# Patient Record
Sex: Female | Born: 1955 | Race: Black or African American | Hispanic: No | Marital: Married | Smoking: Never smoker
Health system: Southern US, Community
[De-identification: ages and names within clinical notes are randomized; demographics above are authoritative.]

## PROBLEM LIST (undated history)

## (undated) DIAGNOSIS — Z90710 Acquired absence of both cervix and uterus: Secondary | ICD-10-CM

## (undated) DIAGNOSIS — E669 Obesity, unspecified: Secondary | ICD-10-CM

## (undated) DIAGNOSIS — K219 Gastro-esophageal reflux disease without esophagitis: Secondary | ICD-10-CM

## (undated) DIAGNOSIS — Z7901 Long term (current) use of anticoagulants: Secondary | ICD-10-CM

## (undated) DIAGNOSIS — Z9289 Personal history of other medical treatment: Secondary | ICD-10-CM

## (undated) DIAGNOSIS — E876 Hypokalemia: Secondary | ICD-10-CM

## (undated) DIAGNOSIS — B029 Zoster without complications: Secondary | ICD-10-CM

## (undated) DIAGNOSIS — Z9229 Personal history of other drug therapy: Secondary | ICD-10-CM

## (undated) DIAGNOSIS — I10 Essential (primary) hypertension: Secondary | ICD-10-CM

## (undated) DIAGNOSIS — Z8679 Personal history of other diseases of the circulatory system: Secondary | ICD-10-CM

## (undated) DIAGNOSIS — M1990 Unspecified osteoarthritis, unspecified site: Secondary | ICD-10-CM

## (undated) DIAGNOSIS — E785 Hyperlipidemia, unspecified: Secondary | ICD-10-CM

## (undated) DIAGNOSIS — R5383 Other fatigue: Secondary | ICD-10-CM

## (undated) DIAGNOSIS — I4891 Unspecified atrial fibrillation: Secondary | ICD-10-CM

## (undated) HISTORY — PX: ABDOMINAL HYSTERECTOMY: SHX81

## (undated) HISTORY — DX: Personal history of other drug therapy: Z92.29

## (undated) HISTORY — DX: Personal history of other diseases of the circulatory system: Z86.79

## (undated) HISTORY — DX: Personal history of other medical treatment: Z92.89

## (undated) HISTORY — DX: Other fatigue: R53.83

## (undated) HISTORY — DX: Long term (current) use of anticoagulants: Z79.01

## (undated) HISTORY — DX: Hypokalemia: E87.6

## (undated) HISTORY — DX: Obesity, unspecified: E66.9

## (undated) HISTORY — DX: Gastro-esophageal reflux disease without esophagitis: K21.9

## (undated) HISTORY — DX: Hyperlipidemia, unspecified: E78.5

## (undated) HISTORY — PX: PARTIAL HYSTERECTOMY: SHX80

---

## 1999-05-25 ENCOUNTER — Ambulatory Visit (HOSPITAL_COMMUNITY): Admission: RE | Admit: 1999-05-25 | Discharge: 1999-05-25 | Payer: Self-pay

## 1999-11-21 ENCOUNTER — Emergency Department (HOSPITAL_COMMUNITY): Admission: EM | Admit: 1999-11-21 | Discharge: 1999-11-21 | Payer: Self-pay | Admitting: Emergency Medicine

## 2000-07-19 ENCOUNTER — Ambulatory Visit (HOSPITAL_COMMUNITY): Admission: RE | Admit: 2000-07-19 | Discharge: 2000-07-19 | Payer: Self-pay | Admitting: *Deleted

## 2002-02-15 ENCOUNTER — Emergency Department (HOSPITAL_COMMUNITY): Admission: EM | Admit: 2002-02-15 | Discharge: 2002-02-15 | Payer: Self-pay | Admitting: Emergency Medicine

## 2002-08-01 ENCOUNTER — Encounter: Payer: Self-pay | Admitting: Emergency Medicine

## 2002-08-01 ENCOUNTER — Emergency Department (HOSPITAL_COMMUNITY): Admission: EM | Admit: 2002-08-01 | Discharge: 2002-08-01 | Payer: Self-pay | Admitting: Emergency Medicine

## 2003-01-31 ENCOUNTER — Emergency Department (HOSPITAL_COMMUNITY): Admission: EM | Admit: 2003-01-31 | Discharge: 2003-01-31 | Payer: Self-pay | Admitting: Emergency Medicine

## 2003-05-27 ENCOUNTER — Emergency Department (HOSPITAL_COMMUNITY): Admission: EM | Admit: 2003-05-27 | Discharge: 2003-05-27 | Payer: Self-pay | Admitting: Emergency Medicine

## 2003-06-12 ENCOUNTER — Ambulatory Visit (HOSPITAL_COMMUNITY): Admission: RE | Admit: 2003-06-12 | Discharge: 2003-06-12 | Payer: Self-pay | Admitting: Internal Medicine

## 2003-06-12 ENCOUNTER — Encounter: Payer: Self-pay | Admitting: Internal Medicine

## 2003-06-19 ENCOUNTER — Encounter: Payer: Self-pay | Admitting: Internal Medicine

## 2003-06-19 ENCOUNTER — Ambulatory Visit (HOSPITAL_COMMUNITY): Admission: RE | Admit: 2003-06-19 | Discharge: 2003-06-19 | Payer: Self-pay | Admitting: Internal Medicine

## 2003-07-03 ENCOUNTER — Ambulatory Visit (HOSPITAL_COMMUNITY): Admission: RE | Admit: 2003-07-03 | Discharge: 2003-07-03 | Payer: Self-pay | Admitting: Internal Medicine

## 2003-07-03 ENCOUNTER — Encounter: Payer: Self-pay | Admitting: Internal Medicine

## 2003-09-28 ENCOUNTER — Ambulatory Visit (HOSPITAL_COMMUNITY): Admission: RE | Admit: 2003-09-28 | Discharge: 2003-09-28 | Payer: Self-pay | Admitting: Family Medicine

## 2004-02-24 ENCOUNTER — Emergency Department (HOSPITAL_COMMUNITY): Admission: EM | Admit: 2004-02-24 | Discharge: 2004-02-24 | Payer: Self-pay | Admitting: Emergency Medicine

## 2004-09-28 ENCOUNTER — Ambulatory Visit: Payer: Self-pay | Admitting: *Deleted

## 2004-09-28 ENCOUNTER — Emergency Department (HOSPITAL_COMMUNITY): Admission: EM | Admit: 2004-09-28 | Discharge: 2004-09-28 | Payer: Self-pay | Admitting: Emergency Medicine

## 2004-12-06 ENCOUNTER — Ambulatory Visit: Payer: Self-pay | Admitting: Family Medicine

## 2005-01-12 ENCOUNTER — Ambulatory Visit: Payer: Self-pay | Admitting: Family Medicine

## 2005-01-17 ENCOUNTER — Ambulatory Visit: Payer: Self-pay | Admitting: Family Medicine

## 2005-03-14 ENCOUNTER — Ambulatory Visit: Payer: Self-pay | Admitting: Family Medicine

## 2005-05-12 ENCOUNTER — Ambulatory Visit: Payer: Self-pay | Admitting: Family Medicine

## 2005-05-18 ENCOUNTER — Ambulatory Visit (HOSPITAL_COMMUNITY): Admission: RE | Admit: 2005-05-18 | Discharge: 2005-05-18 | Payer: Self-pay | Admitting: Family Medicine

## 2005-07-27 ENCOUNTER — Ambulatory Visit: Payer: Self-pay | Admitting: Family Medicine

## 2005-11-07 ENCOUNTER — Ambulatory Visit: Payer: Self-pay | Admitting: Internal Medicine

## 2005-11-21 ENCOUNTER — Ambulatory Visit: Payer: Self-pay | Admitting: Family Medicine

## 2006-01-23 ENCOUNTER — Ambulatory Visit: Payer: Self-pay | Admitting: Family Medicine

## 2006-05-24 ENCOUNTER — Ambulatory Visit: Payer: Self-pay | Admitting: Family Medicine

## 2006-06-19 ENCOUNTER — Ambulatory Visit: Payer: Self-pay | Admitting: Family Medicine

## 2006-07-27 ENCOUNTER — Ambulatory Visit: Payer: Self-pay | Admitting: Family Medicine

## 2006-09-11 ENCOUNTER — Ambulatory Visit: Payer: Self-pay | Admitting: Family Medicine

## 2006-10-09 ENCOUNTER — Ambulatory Visit (HOSPITAL_COMMUNITY): Admission: RE | Admit: 2006-10-09 | Discharge: 2006-10-09 | Payer: Self-pay | Admitting: Internal Medicine

## 2006-10-11 ENCOUNTER — Ambulatory Visit: Payer: Self-pay | Admitting: Family Medicine

## 2007-01-31 ENCOUNTER — Inpatient Hospital Stay (HOSPITAL_COMMUNITY): Admission: EM | Admit: 2007-01-31 | Discharge: 2007-02-01 | Payer: Self-pay | Admitting: Emergency Medicine

## 2007-02-21 ENCOUNTER — Ambulatory Visit: Payer: Self-pay | Admitting: Family Medicine

## 2007-03-04 ENCOUNTER — Emergency Department (HOSPITAL_COMMUNITY): Admission: EM | Admit: 2007-03-04 | Discharge: 2007-03-04 | Payer: Self-pay | Admitting: Emergency Medicine

## 2007-03-11 ENCOUNTER — Ambulatory Visit: Payer: Self-pay | Admitting: Family Medicine

## 2007-03-19 ENCOUNTER — Ambulatory Visit: Payer: Self-pay | Admitting: Family Medicine

## 2007-03-21 ENCOUNTER — Ambulatory Visit: Payer: Self-pay | Admitting: Family Medicine

## 2007-04-02 ENCOUNTER — Ambulatory Visit: Payer: Self-pay | Admitting: Family Medicine

## 2007-04-13 ENCOUNTER — Ambulatory Visit: Payer: Self-pay | Admitting: Internal Medicine

## 2007-04-13 ENCOUNTER — Inpatient Hospital Stay (HOSPITAL_COMMUNITY): Admission: EM | Admit: 2007-04-13 | Discharge: 2007-04-16 | Payer: Self-pay | Admitting: Emergency Medicine

## 2007-04-14 ENCOUNTER — Encounter: Payer: Self-pay | Admitting: Cardiovascular Disease

## 2007-04-15 HISTORY — PX: CARDIAC CATHETERIZATION: SHX172

## 2007-04-23 ENCOUNTER — Ambulatory Visit: Payer: Self-pay | Admitting: Family Medicine

## 2007-04-30 ENCOUNTER — Ambulatory Visit: Payer: Self-pay | Admitting: Family Medicine

## 2007-05-14 ENCOUNTER — Ambulatory Visit: Payer: Self-pay | Admitting: Family Medicine

## 2007-06-12 ENCOUNTER — Ambulatory Visit: Payer: Self-pay | Admitting: Family Medicine

## 2007-06-12 DIAGNOSIS — K219 Gastro-esophageal reflux disease without esophagitis: Secondary | ICD-10-CM

## 2007-06-12 DIAGNOSIS — I4891 Unspecified atrial fibrillation: Secondary | ICD-10-CM

## 2007-06-12 DIAGNOSIS — I10 Essential (primary) hypertension: Secondary | ICD-10-CM | POA: Insufficient documentation

## 2007-06-12 DIAGNOSIS — E785 Hyperlipidemia, unspecified: Secondary | ICD-10-CM | POA: Insufficient documentation

## 2007-06-12 DIAGNOSIS — I4819 Other persistent atrial fibrillation: Secondary | ICD-10-CM | POA: Insufficient documentation

## 2007-07-11 ENCOUNTER — Ambulatory Visit: Payer: Self-pay | Admitting: Family Medicine

## 2007-07-29 ENCOUNTER — Ambulatory Visit: Payer: Self-pay | Admitting: Family Medicine

## 2007-08-11 ENCOUNTER — Emergency Department (HOSPITAL_COMMUNITY): Admission: EM | Admit: 2007-08-11 | Discharge: 2007-08-11 | Payer: Self-pay | Admitting: Emergency Medicine

## 2007-09-11 ENCOUNTER — Encounter (INDEPENDENT_AMBULATORY_CARE_PROVIDER_SITE_OTHER): Payer: Self-pay | Admitting: *Deleted

## 2007-09-23 ENCOUNTER — Encounter (INDEPENDENT_AMBULATORY_CARE_PROVIDER_SITE_OTHER): Payer: Self-pay | Admitting: Family Medicine

## 2007-09-23 ENCOUNTER — Ambulatory Visit: Payer: Self-pay | Admitting: Internal Medicine

## 2007-09-23 LAB — CONVERTED CEMR LAB
BUN: 12 mg/dL (ref 6–23)
Calcium: 8.8 mg/dL (ref 8.4–10.5)
Chloride: 106 meq/L (ref 96–112)
Cholesterol: 146 mg/dL (ref 0–200)
Potassium: 3.9 meq/L (ref 3.5–5.3)
Sodium: 141 meq/L (ref 135–145)
Total Bilirubin: 0.7 mg/dL (ref 0.3–1.2)
Total CHOL/HDL Ratio: 3
Triglycerides: 86 mg/dL (ref <150)

## 2007-09-30 ENCOUNTER — Ambulatory Visit: Payer: Self-pay | Admitting: Internal Medicine

## 2007-10-10 ENCOUNTER — Ambulatory Visit: Payer: Self-pay | Admitting: Internal Medicine

## 2007-10-11 ENCOUNTER — Ambulatory Visit (HOSPITAL_COMMUNITY): Admission: RE | Admit: 2007-10-11 | Discharge: 2007-10-11 | Payer: Self-pay | Admitting: Family Medicine

## 2007-10-30 ENCOUNTER — Ambulatory Visit: Payer: Self-pay | Admitting: Internal Medicine

## 2008-02-28 ENCOUNTER — Ambulatory Visit: Payer: Self-pay | Admitting: Family Medicine

## 2008-02-28 ENCOUNTER — Ambulatory Visit (HOSPITAL_COMMUNITY): Admission: RE | Admit: 2008-02-28 | Discharge: 2008-02-28 | Payer: Self-pay | Admitting: Family Medicine

## 2008-04-29 ENCOUNTER — Ambulatory Visit: Payer: Self-pay | Admitting: Internal Medicine

## 2008-04-29 ENCOUNTER — Encounter (INDEPENDENT_AMBULATORY_CARE_PROVIDER_SITE_OTHER): Payer: Self-pay | Admitting: Family Medicine

## 2008-04-29 LAB — CONVERTED CEMR LAB
BUN: 9 mg/dL (ref 6–23)
CO2: 25 meq/L (ref 19–32)
Chloride: 103 meq/L (ref 96–112)
Cholesterol: 151 mg/dL (ref 0–200)
Creatinine, Ser: 0.63 mg/dL (ref 0.40–1.20)
Free T4: 1.34 ng/dL (ref 0.89–1.80)
HDL: 55 mg/dL (ref >39)
Potassium: 4 meq/L (ref 3.5–5.3)
Sodium: 140 meq/L (ref 135–145)
TSH: 1.011 microintl units/mL (ref 0.350–5.50)
Total CHOL/HDL Ratio: 2.7
Triglycerides: 120 mg/dL (ref <150)

## 2008-06-18 ENCOUNTER — Ambulatory Visit: Payer: Self-pay | Admitting: Internal Medicine

## 2008-08-20 ENCOUNTER — Emergency Department (HOSPITAL_COMMUNITY): Admission: EM | Admit: 2008-08-20 | Discharge: 2008-08-21 | Payer: Self-pay | Admitting: Emergency Medicine

## 2008-09-12 ENCOUNTER — Emergency Department (HOSPITAL_COMMUNITY): Admission: EM | Admit: 2008-09-12 | Discharge: 2008-09-12 | Payer: Self-pay | Admitting: Emergency Medicine

## 2008-09-28 ENCOUNTER — Ambulatory Visit: Payer: Self-pay | Admitting: Internal Medicine

## 2008-09-28 LAB — CONVERTED CEMR LAB
ALT: 24 units/L (ref 0–35)
Albumin: 3.9 g/dL (ref 3.5–5.2)
BUN: 13 mg/dL (ref 6–23)
Basophils Absolute: 0 10*3/uL (ref 0.0–0.1)
Basophils Relative: 0 % (ref 0–1)
Calcium: 9 mg/dL (ref 8.4–10.5)
Chloride: 101 meq/L (ref 96–112)
Cholesterol: 190 mg/dL (ref 0–200)
Eosinophils Absolute: 0.2 10*3/uL (ref 0.0–0.7)
Glucose, Bld: 114 mg/dL — ABNORMAL HIGH (ref 70–99)
Helicobacter Pylori Antibody-IgG: <0.4
MCHC: 32.5 g/dL (ref 30.0–36.0)
MCV: 86.4 fL (ref 78.0–100.0)
Monocytes Relative: 5 % (ref 3–12)
Neutrophils Relative %: 56 % (ref 43–77)
Platelets: 371 10*3/uL (ref 150–400)
Potassium: 4.1 meq/L (ref 3.5–5.3)
Sodium: 140 meq/L (ref 135–145)
T3 Uptake Ratio: 32.8 % (ref 22.5–37.0)
VLDL: 34 mg/dL (ref 0–40)
WBC: 9.6 10*3/uL (ref 4.0–10.5)

## 2008-10-12 ENCOUNTER — Ambulatory Visit (HOSPITAL_COMMUNITY): Admission: RE | Admit: 2008-10-12 | Discharge: 2008-10-12 | Payer: Self-pay | Admitting: Family Medicine

## 2008-10-21 ENCOUNTER — Ambulatory Visit: Payer: Self-pay | Admitting: Internal Medicine

## 2008-12-30 ENCOUNTER — Ambulatory Visit: Payer: Self-pay | Admitting: Internal Medicine

## 2008-12-30 ENCOUNTER — Encounter (INDEPENDENT_AMBULATORY_CARE_PROVIDER_SITE_OTHER): Payer: Self-pay | Admitting: Adult Health

## 2008-12-30 LAB — CONVERTED CEMR LAB: INR: 1 (ref 0.0–1.5)

## 2009-02-24 ENCOUNTER — Ambulatory Visit: Payer: Self-pay | Admitting: Internal Medicine

## 2009-02-24 ENCOUNTER — Encounter (INDEPENDENT_AMBULATORY_CARE_PROVIDER_SITE_OTHER): Payer: Self-pay | Admitting: Adult Health

## 2009-02-24 LAB — CONVERTED CEMR LAB
Cholesterol: 161 mg/dL (ref 0–200)
HDL: 48 mg/dL (ref >39)
Total CHOL/HDL Ratio: 3.4
Triglycerides: 109 mg/dL (ref <150)
VLDL: 22 mg/dL (ref 0–40)

## 2009-05-21 ENCOUNTER — Emergency Department (HOSPITAL_COMMUNITY): Admission: EM | Admit: 2009-05-21 | Discharge: 2009-05-21 | Payer: Self-pay | Admitting: Emergency Medicine

## 2009-07-23 ENCOUNTER — Ambulatory Visit: Payer: Self-pay | Admitting: Family Medicine

## 2009-09-09 ENCOUNTER — Ambulatory Visit: Payer: Self-pay | Admitting: Internal Medicine

## 2009-09-15 ENCOUNTER — Ambulatory Visit (HOSPITAL_COMMUNITY): Admission: RE | Admit: 2009-09-15 | Discharge: 2009-09-15 | Payer: Self-pay | Admitting: Internal Medicine

## 2009-10-01 ENCOUNTER — Telehealth (INDEPENDENT_AMBULATORY_CARE_PROVIDER_SITE_OTHER): Payer: Self-pay | Admitting: *Deleted

## 2009-10-13 ENCOUNTER — Ambulatory Visit (HOSPITAL_COMMUNITY): Admission: RE | Admit: 2009-10-13 | Discharge: 2009-10-13 | Payer: Self-pay | Admitting: Internal Medicine

## 2009-11-13 ENCOUNTER — Observation Stay (HOSPITAL_COMMUNITY): Admission: EM | Admit: 2009-11-13 | Discharge: 2009-11-13 | Payer: Self-pay | Admitting: Emergency Medicine

## 2009-12-03 ENCOUNTER — Ambulatory Visit: Payer: Self-pay | Admitting: Internal Medicine

## 2009-12-03 ENCOUNTER — Encounter (INDEPENDENT_AMBULATORY_CARE_PROVIDER_SITE_OTHER): Payer: Self-pay | Admitting: Adult Health

## 2009-12-03 LAB — CONVERTED CEMR LAB
Alkaline Phosphatase: 66 units/L (ref 39–117)
BUN: 7 mg/dL (ref 6–23)
CO2: 22 meq/L (ref 19–32)
Chloride: 107 meq/L (ref 96–112)
LDL Cholesterol: 92 mg/dL (ref 0–99)
Potassium: 4.1 meq/L (ref 3.5–5.3)
Sodium: 144 meq/L (ref 135–145)
Total CHOL/HDL Ratio: 3.7
VLDL: 23 mg/dL (ref 0–40)

## 2009-12-31 ENCOUNTER — Ambulatory Visit: Payer: Self-pay | Admitting: Internal Medicine

## 2010-02-08 ENCOUNTER — Emergency Department (HOSPITAL_COMMUNITY): Admission: EM | Admit: 2010-02-08 | Discharge: 2010-02-08 | Payer: Self-pay | Admitting: Emergency Medicine

## 2010-05-10 ENCOUNTER — Ambulatory Visit: Payer: Self-pay | Admitting: Internal Medicine

## 2010-05-10 ENCOUNTER — Encounter (INDEPENDENT_AMBULATORY_CARE_PROVIDER_SITE_OTHER): Payer: Self-pay | Admitting: Adult Health

## 2010-05-10 LAB — CONVERTED CEMR LAB
Albumin: 4.1 g/dL (ref 3.5–5.2)
Alkaline Phosphatase: 66 units/L (ref 39–117)
Basophils Absolute: 0.1 10*3/uL (ref 0.0–0.1)
CO2: 24 meq/L (ref 19–32)
Calcium: 9 mg/dL (ref 8.4–10.5)
Cholesterol: 146 mg/dL (ref 0–200)
Creatinine, Ser: 0.68 mg/dL (ref 0.40–1.20)
Eosinophils Relative: 1 % (ref 0–5)
Glucose, Bld: 114 mg/dL — ABNORMAL HIGH (ref 70–99)
HCT: 41.9 % (ref 36.0–46.0)
Lymphs Abs: 3.9 10*3/uL (ref 0.7–4.0)
Neutro Abs: 5.1 10*3/uL (ref 1.7–7.7)
Neutrophils Relative %: 53 % (ref 43–77)
RBC: 4.84 M/uL (ref 3.87–5.11)
RDW: 15.2 % (ref 11.5–15.5)
Sodium: 139 meq/L (ref 135–145)
TSH: 0.984 microintl units/mL (ref 0.350–4.500)
Total Bilirubin: 0.5 mg/dL (ref 0.3–1.2)
Total CHOL/HDL Ratio: 3.1
Vit D, 25-Hydroxy: 25 ng/mL — ABNORMAL LOW (ref 30–89)
WBC: 9.7 10*3/uL (ref 4.0–10.5)

## 2010-05-11 ENCOUNTER — Ambulatory Visit (HOSPITAL_COMMUNITY): Admission: RE | Admit: 2010-05-11 | Discharge: 2010-05-11 | Payer: Self-pay | Admitting: Internal Medicine

## 2010-05-12 ENCOUNTER — Ambulatory Visit: Payer: Self-pay | Admitting: Internal Medicine

## 2010-05-16 ENCOUNTER — Ambulatory Visit: Payer: Self-pay | Admitting: Internal Medicine

## 2010-07-22 ENCOUNTER — Ambulatory Visit: Payer: Self-pay | Admitting: Family Medicine

## 2010-08-26 ENCOUNTER — Ambulatory Visit: Payer: Self-pay | Admitting: Internal Medicine

## 2010-10-26 ENCOUNTER — Ambulatory Visit (HOSPITAL_COMMUNITY): Admission: RE | Admit: 2010-10-26 | Discharge: 2010-10-26 | Payer: Self-pay | Admitting: Internal Medicine

## 2010-12-02 ENCOUNTER — Encounter (INDEPENDENT_AMBULATORY_CARE_PROVIDER_SITE_OTHER): Payer: Self-pay | Admitting: *Deleted

## 2010-12-02 LAB — CONVERTED CEMR LAB
Cholesterol: 186 mg/dL (ref 0–200)
HDL: 46 mg/dL (ref >39)
LDL Cholesterol: 114 mg/dL — ABNORMAL HIGH (ref 0–99)
Total CHOL/HDL Ratio: 4
Triglycerides: 128 mg/dL (ref <150)

## 2011-02-18 ENCOUNTER — Emergency Department (HOSPITAL_COMMUNITY): Payer: Self-pay

## 2011-02-18 ENCOUNTER — Emergency Department (HOSPITAL_COMMUNITY)
Admission: EM | Admit: 2011-02-18 | Discharge: 2011-02-18 | Disposition: A | Discharge status: Home / Self Care | Payer: Self-pay | Attending: Emergency Medicine | Admitting: Emergency Medicine

## 2011-02-18 DIAGNOSIS — IMO0001 Reserved for inherently not codable concepts without codable children: Secondary | ICD-10-CM | POA: Insufficient documentation

## 2011-02-18 DIAGNOSIS — R51 Headache: Secondary | ICD-10-CM | POA: Insufficient documentation

## 2011-02-18 DIAGNOSIS — R0789 Other chest pain: Secondary | ICD-10-CM | POA: Insufficient documentation

## 2011-02-18 DIAGNOSIS — I10 Essential (primary) hypertension: Secondary | ICD-10-CM | POA: Insufficient documentation

## 2011-02-18 DIAGNOSIS — R0602 Shortness of breath: Secondary | ICD-10-CM | POA: Insufficient documentation

## 2011-02-18 DIAGNOSIS — Z79899 Other long term (current) drug therapy: Secondary | ICD-10-CM | POA: Insufficient documentation

## 2011-02-18 DIAGNOSIS — R0989 Other specified symptoms and signs involving the circulatory and respiratory systems: Secondary | ICD-10-CM | POA: Insufficient documentation

## 2011-02-18 DIAGNOSIS — R110 Nausea: Secondary | ICD-10-CM | POA: Insufficient documentation

## 2011-02-18 DIAGNOSIS — I4891 Unspecified atrial fibrillation: Secondary | ICD-10-CM | POA: Insufficient documentation

## 2011-02-18 DIAGNOSIS — Z7982 Long term (current) use of aspirin: Secondary | ICD-10-CM | POA: Insufficient documentation

## 2011-02-18 DIAGNOSIS — M549 Dorsalgia, unspecified: Secondary | ICD-10-CM | POA: Insufficient documentation

## 2011-02-18 DIAGNOSIS — E785 Hyperlipidemia, unspecified: Secondary | ICD-10-CM | POA: Insufficient documentation

## 2011-02-18 DIAGNOSIS — R42 Dizziness and giddiness: Secondary | ICD-10-CM | POA: Insufficient documentation

## 2011-02-18 DIAGNOSIS — R0609 Other forms of dyspnea: Secondary | ICD-10-CM | POA: Insufficient documentation

## 2011-02-18 DIAGNOSIS — K219 Gastro-esophageal reflux disease without esophagitis: Secondary | ICD-10-CM | POA: Insufficient documentation

## 2011-02-18 DIAGNOSIS — R4182 Altered mental status, unspecified: Secondary | ICD-10-CM | POA: Insufficient documentation

## 2011-02-18 LAB — DIFFERENTIAL
Basophils Absolute: 0 10*3/uL (ref 0.0–0.1)
Basophils Relative: 0 % (ref 0–1)
Eosinophils Absolute: 0.2 10*3/uL (ref 0.0–0.7)
Lymphocytes Relative: 41 % (ref 12–46)
Lymphs Abs: 4 10*3/uL (ref 0.7–4.0)
Monocytes Absolute: 0.5 10*3/uL (ref 0.1–1.0)
Monocytes Relative: 5 % (ref 3–12)

## 2011-02-18 LAB — POCT CARDIAC MARKERS
CKMB, poc: 3 ng/mL (ref 1.0–8.0)
Troponin i, poc: <0.05 ng/mL (ref 0.00–0.09)
Troponin i, poc: <0.05 ng/mL (ref 0.00–0.09)

## 2011-02-18 LAB — CBC
Hemoglobin: 13.1 g/dL (ref 12.0–15.0)
MCH: 28 pg (ref 26.0–34.0)
MCHC: 33.2 g/dL (ref 30.0–36.0)
MCV: 84.4 fL (ref 78.0–100.0)
RBC: 4.68 MIL/uL (ref 3.87–5.11)

## 2011-02-18 LAB — POCT I-STAT, CHEM 8
BUN: 8 mg/dL (ref 6–23)
Chloride: 103 mEq/L (ref 96–112)
Potassium: 3.9 mEq/L (ref 3.5–5.1)

## 2011-02-18 LAB — URINALYSIS, ROUTINE W REFLEX MICROSCOPIC
Bilirubin Urine: NEGATIVE
Ketones, ur: NEGATIVE mg/dL
Protein, ur: NEGATIVE mg/dL
Urine Glucose, Fasting: NEGATIVE mg/dL
Urobilinogen, UA: 0.2 mg/dL (ref 0.0–1.0)

## 2011-03-29 LAB — URINALYSIS, ROUTINE W REFLEX MICROSCOPIC
Bilirubin Urine: NEGATIVE
Glucose, UA: NEGATIVE mg/dL
Ketones, ur: NEGATIVE mg/dL
Protein, ur: NEGATIVE mg/dL
pH: 7.5 (ref 5.0–8.0)

## 2011-03-29 LAB — CBC
HCT: 42.5 % (ref 36.0–46.0)
Hemoglobin: 14.5 g/dL (ref 12.0–15.0)
MCHC: 34.2 g/dL (ref 30.0–36.0)
MCV: 85.4 fL (ref 78.0–100.0)
Platelets: 299 10*3/uL (ref 150–400)
RBC: 4.97 MIL/uL (ref 3.87–5.11)
RDW: 14.7 % (ref 11.5–15.5)
WBC: 8.1 10*3/uL (ref 4.0–10.5)

## 2011-03-29 LAB — POCT I-STAT, CHEM 8
BUN: 7 mg/dL (ref 6–23)
Calcium, Ion: 1.13 mmol/L (ref 1.12–1.32)
Chloride: 101 mEq/L (ref 96–112)
Creatinine, Ser: 0.6 mg/dL (ref 0.4–1.2)
Glucose, Bld: 105 mg/dL — ABNORMAL HIGH (ref 70–99)
HCT: 46 % (ref 36.0–46.0)
Hemoglobin: 15.6 g/dL — ABNORMAL HIGH (ref 12.0–15.0)
Potassium: 3.9 mEq/L (ref 3.5–5.1)
Sodium: 139 mEq/L (ref 135–145)
TCO2: 29 mmol/L (ref 0–100)

## 2011-03-29 LAB — DIFFERENTIAL
Basophils Absolute: 0 10*3/uL (ref 0.0–0.1)
Lymphocytes Relative: 11 % — ABNORMAL LOW (ref 12–46)
Monocytes Absolute: 0 10*3/uL — ABNORMAL LOW (ref 0.1–1.0)
Neutro Abs: 7 10*3/uL (ref 1.7–7.7)

## 2011-03-29 LAB — COMPREHENSIVE METABOLIC PANEL
Albumin: 3.7 g/dL (ref 3.5–5.2)
BUN: 8 mg/dL (ref 6–23)
Chloride: 101 mEq/L (ref 96–112)
Creatinine, Ser: 0.72 mg/dL (ref 0.4–1.2)
Total Bilirubin: 0.8 mg/dL (ref 0.3–1.2)
Total Protein: 7.3 g/dL (ref 6.0–8.3)

## 2011-05-12 NOTE — Discharge Summary (Signed)
NAMEMAESYN, FRISINGER           ACCOUNT NO.:  [id number]   MEDICAL RECORD NO.:  [id number]          PATIENT TYPE:  INP   LOCATION:  2024                         FACILITY:  [facility]   PHYSICIAN:  Olene Craven, M.D.  DATE OF BIRTH:  May 18, 1956   DATE OF ADMISSION:  04/13/2007  DATE OF DISCHARGE:  04/16/2007                               DISCHARGE SUMMARY   DISCHARGE DIAGNOSES:  1. Palpitations secondary to recurrent atrial fibrillation with rapid      ventricular rate, now rate controlled at rest.  2. Nonischemic chest pressure (with normal coronary catheterization)      likely related to atrial fibrillation versus gastroesophageal      reflux disease.  3. Stage 1 hypertension.  4. Dyslipidemia.  5. Mild hypokalemia on admission secondary to diuretic.  6. Gastroesophageal reflux disease.  7. Obesity.   DISCHARGE MEDICATIONS:  1. Metoprolol 50 mg p.o. b.i.d.  2. Diltiazem 180 mg p.o. daily.  3. Coumadin 5 mg p.o. daily.  4. Crestor 10 mg p.o. nightly.  5. Prilosec 20 mg 2 tabs p.o. 30 minutes before breakfast daily x4-6      week.   DISPOSITION AND FOLLOWUP:  Patient was discharged in hemodynamically  stable condition.  She was still in atrial fibrillation but did not have  rapid ventricular response at rest.  Of note, she did still have RVR in  the 120-130 range at exertion.  Patient's INR was nontherapeutic but she  did not need to be bridged with Lovenox given no thrombus was found on 2-  D echo.  1. Patient was scheduled to followup with her primary care physician,      Dr. Fannie Knee Drinkard from [facility] on April 29 at noon.  At that      time Dr. Margarette Canada is asked to followup with Mrs. Venezia' INR and      to adjust her warfarin accordingly.  Dr. Margarette Canada can also adjust      Mrs. Lycan' rate control regimen if patient remains in atrial      fibrillation with RVR.  Please consider starting patient on an ACEI      given her LVH.  Her HCTZ and Norvasc have been  discontinued so at      follow up, depending on her blood pressure please begin a low cost      ACEI such as lisinopril.  2. She will also followup with Dr. Kristeen Miss from [facility]      Cardiology on May 23 at 4 p.m.  At that time Dr. Elease Hashimoto will      evaluate patient's rate control regimen.  If she is still      symptomatic from her atrial fibrillation perhaps cardioversion      should be considered.  Dr. Elease Hashimoto may also make sure that Mrs.      Verret is compliant with her Coumadin and that the proper adjusts      are being made for a goal INR of 2-3.  If patient is not compliant      with her warfarin then at least high dose aspirin at 325 mg p.o.  daily should be recommended.  Given the possibility that patient      might have sick sinus syndrome please monitor her arrhythmia to      evaluate when she would benefit from a pacemaker.   PROCEDURE:  1. A chest x-ray on April 19th showed no active cardiopulmonary      disease.  2. A 2-D echo on April 20th showed overall left ventricular systolic      function at the lower limits of normal.  LV wall thickness mildly      increased.  Mild MR.  LA mildly dilated.  3. A coronary catheterization was done on April 21st and revealed      normal coronary arteries.   CONSULTATIONS:  Dr. Donnie Aho from cardiology was consulted.   ADMISSION HISTORY AND PHYSICAL:  The patient is a 55 year old woman with  past medical history significant for new onset atrial fibrillation since  February 2008 who spontaneously converted to sinus rhythm once started  on metoprolol, hypertension and hyperlipidemia who presented to the  [facility] Emergency Department with complaints of heart palpitations  and substernal chest pressure.  Since discharge from the hospital  patient has been experiencing daily palpitations where she feels that  her heart is beating faster and harder but regularly.  Her palpitations  are worsen by exertion.  She often feels  faint when she has palpitations  but never had syncope.  Patient has also had severe chest pressure  radiating to her epigastrium to her throat for the past 2 weeks.  She  mostly gets this chest pressure when she swallows her potassium and  Crestor tabs which she washes down with cold liquid.  Of note, she has  been chewing her KCL tabs.  She also gets the chest pressure when she is  experiencing heart palpitations.  She denied nausea, vomiting,  diaphoresis.  However, she endorsed mild dysphagia without frank  obstruction over the past 2 weeks.  She denied odynophagia.   PHYSICAL EXAMINATION:  VITAL SIGNS:  Temperature 98.  Blood pressure  114/66.  Pulse 100.  Respiratory 20.  O2 sat 97% on room air.  GENERAL:  Patient was in no acute distress when seen.  Her neck, lung  and abdomen were within normal limits.  HEART:  She had an irregularly irregular heart rhythm with a rate  between 100 and 120.  No murmurs, rubs or gallops were heard.  EXTREMITIES:  Lower extremities without edema.  NEUROLOGICAL EXAM:  Completely nonfocal.   ADMISSION LABS:  WBC 8.8 with an ANC of 4.1.  Hemoglobin of 13.0 with an  MCV of 83.  Platelets 310.  RDW 14.7.  Urinary drug screen negative.  Urinalysis normal.  Sodium 138.  Potassium 3.5.  Chloride 103.  Bicarb  29.  Glucose 70.  BUN 90.  Creatinine 0.78.  Total bilirubin 0.7.  Alk  phos 71.  AST 23.  ALT 29.  Total protein 6.4.  Albumin 3.4.  Calcium  9.1.  Magnesium 2.3.  Lipase 21.  Troponin 0.01.  TSH 1.542.   HOSPITAL COURSE:  1. Recurrent atrial fibrillation with rapid ventricular rate.  Given      that patient atrial fibrillation diagnosis is relatively new we      needed to work up the etiology of her atrial fibrillation.      Hyperthyroidism had already been ruled out in February 2008.  Drug      and alcohol abuse had been ruled out in February and were  also     ruled out during this hospitalization.  On chest x-ray patient did      not have  pneumonia and her D-dimers were below 0.22 which ruled out      pulmonary embolism given that her pretest probably was low.      Patient was afebrile and had no leukocytosis so infection and      sepsis were not an issue.  Patient's hemoglobin was within normal      limits so anemia could not account for her atrial fibrillation.      The 2-D echo done in February 2008 could not be found in patient's      records so a new one was done.  We wanted to rule out pericardial      disease, valvular disease such as mitral stenosis.  The possibility      of an atrial myxoma as well as structural heart damage notably left      ventricular hypertrophy that could have been caused by her      hypertension.  Patient's potassium was mildly low on admission at      3.4 which could have attributed to her atrial fibrillation but      after repeating it patient's palpitations did not resolve nor did      her rapid ventricular rate.  Given her coronary artery disease risk      factors Dr. Elease Hashimoto performed a cardiac catheterization which      revealed normal coronary arteries and ruled out coronary artery      disease as the etiology of her.  Throughout the course of her      hospitalization patient did not require any intravenous medications      to control her heart rate.  In fact she was continued on metoprolol      at a higher dose and diltiazem was added to her regimen.  On      discharge patient was well rated controlled at rest but her rate      increased to the 121-130 range at exertion before discharge.  After      discussing with Dr. Elease Hashimoto became clearer that keeping her in the      hospital would not likely change anything to her exercise tolerance      especially since there was little room for [place] to increase her rate      control medicines because her heart rate was between 60 and 70 at      rest.   In terms of anticoagulation patient was initially felt to be at low risk  of stroke given her  CHADS score of 1.  We interpret patient at such a  low [place] score would mean that she could be anticoagulated with a high  dose of aspirin only.  However, given the results of her 2-D echo on  this left ventricular hypertrophy and LA diltation found Dr. Elease Hashimoto felt  that Coumadin was probably a safer option for Mrs. Dales.  She was  thus discharged on 5 mg daily of Coumadin and will be followed up at  [facility].  Goal INR is from 2-3.  1. Nonischemic chest pain.  On admission patient still had chest      pressure in her electrocardiogram and cardiac enzymes were      negative.  Serial electrocardiograms and cardiac enzymes did not      reveal any changes consistent with acute coronary syndrome.  As  previously mentioned given her risk factors a coronary      catheterization was done and revealed normal coronary artery.  It      was best felt that Mrs. Battie' chest pressure was either     secondary to her atrial fibrillation or possibly to her      gastroesophageal reflux disease.  2. Hypertension.  Mrs. Ruberg' hypertension remained well controlled      while hospitalized.  Of note, her hydrochlorothiazide was      discontinued on admission given her hypokalemia and her Norvasc      will be discontinued as an outpatient.  3. Dyslipidemia.  Patient was on Crestor 10 as an outpatient.  A      fasting lipid profile was done and revealed a total cholesterol of      133, triglycerides of 106, HDL of 37 and LDL of 75.  Life time      modification will be necessary to increase patient's HDL above 40.  4. Mild hypokalemia on admission.  Patient's potassium was 3.4 on      admission which could have contributed to her heart palpitations.      The reason for her hypokalemia was the use of hydrochlorothiazide      as an outpatient.  Her potassium as repleted.  5. Gastroesophageal reflux disease.  Patient has been experiencing      gastroesophageal reflux disease for about a year now  and has not      found significant relief with famotidine 20 mg  p.o. b.i.d.  She      did not have any frank alarm symptoms.  However, the question of      dysphagia associated with her chest pressure remained somewhat      concerning.  I was reassured that once patient's p.o. KCl was      discontinued she did not experience the dysphagia any more.  She      was screened for H. pylori antibodies which were found to be      negative.  A proton pump inhibitor trial will be necessary to      evaluate patient's response.  I hope that she is able to afford the      proton pump inhibitor, which will help her primary care physician      determine whether or not she needs a gastroenterology referral.   DISCHARGE LABS:  Sodium 138.  Potassium 4.  Chloride 107.  Bicarb 26.  Glucose 104.  BUN 8.  Creatinine 0.84.  Calcium 8.9.  INR 1.2.  WBC 0.1.  Hemoglobin 12.6.  Platelets 291.   DISCHARGE VITALS:  Temperature 98.6.  Heart rate 95.  Respiratory rate  18.  Blood pressure 105/70.  O2 sat 96% on room air.      Olene Craven, M.D.  Electronically Signed     MC/MEDQ  D:  04/16/2007  T:  04/16/2007  Job:  [id number]   cc:   Vesta Mixer, M.D.  Fannie Knee Drinkard, N.P.

## 2011-05-12 NOTE — Cardiovascular Report (Signed)
NAME:  [name], [name]           ACCOUNT NO.:  [id number]   MEDICAL RECORD NO.:  [id number]          PATIENT TYPE:  INP   LOCATION:  2024                         FACILITY:  [facility]   PHYSICIAN:  Vesta Mixer, M.D. DATE OF BIRTH:  1956/01/28   DATE OF PROCEDURE:  04/15/2007  DATE OF DISCHARGE:                            CARDIAC CATHETERIZATION   Jacqueline Petersen is a 55 year old female with a history of atrial  fibrillation.  She presented to the hospital several days ago with  episodes of chest pain and atrial fibrillation.  Because of her symptoms  of chest pain, we have referred her for heart catheterization.   PROCEDURE:  Left heart catheterization with coronary angiography.   The right femoral artery was used cannulated using a Smart needle.   HEMODYNAMICS:  The left ventricular pressure was 106/21 with an aortic  pressure of 108/66.   ANGIOGRAPHY:  The left main is fairly smooth and normal.   The left anterior descending artery is smooth and normal.  There are  several moderate-size diagonal arteries which are normal.   The left circumflex artery is large vessel.  It provides flow to a large  obtuse marginal artery which is normal.  The distal circumflex is  relatively small but is unremarkable.   The right coronary artery is small to moderate in size but is dominant.  It is smooth and normal.  The posterior descending artery is normal.   The left ventriculogram revealed overall normal left ventricular  systolic function.  Ejection fraction is around 50-55%.   COMPLICATIONS:  None.   CONCLUSION:  1. Smooth and normal coronary arteries.  2. Normal left ventricular systolic function.  We will continue with      further treatment of her atrial fibrillation.           ______________________________  Vesta Mixer, M.D.    PJN/MEDQ  D:  04/15/2007  T:  04/15/2007  Job:  [id number]   cc:   Madaline Guthrie, M.D.  Turkey R. Rankins, M.D.

## 2011-05-12 NOTE — Consult Note (Signed)
NAME:  Jacqueline Petersen, Jacqueline Petersen           ACCOUNT NO.:  [id number]   MEDICAL RECORD NO.:  [id number]          PATIENT TYPE:  INP   LOCATION:  2024                         FACILITY:  [facility]   PHYSICIAN:  Georga Hacking, M.D.DATE OF BIRTH:  1956/02/04   DATE OF CONSULTATION:  04/14/2007  DATE OF DISCHARGE:                                 CONSULTATION   This 55 year old black female is seen at the request of the internal  medicine teaching service for evaluation of chest pain and atrial  fibrillation.  The patient was admitted on February 6 with atrial  fibrillation and at that time had negative thyroid profile.  She  converted to sinus rhythm following intravenous diltiazem.  Since that  time she has had frequent tachy palpitations and presented to the  emergency room yesterday with substernal chest discomfort described as  pressure radiating up into her neck.  This persisted for a while, and  she was found to be in rapid atrial fibrillation.  She has been seen by  Dr. Elease Hashimoto one time since discharge and has been taking her medications,  she says.  She was admitted, and serial enzymes have been negative and  there have been no EKG changes.  She continues to complain of vague  chest tightness.  Her tachy palpitations are accompanied with feelings  of shortness of breath, and she has also had significant pressure for  two weeks, particularly when she would take her medications.   PAST MEDICAL HISTORY:  Remarkable for:  1. Atrial fibrillation.  2. Hyperlipidemia.  3. History of hypertensive heart disease.  4. Obesity.   MEDICATIONS ON ADMISSION:  1. Hydrochlorothiazide 12.5 mg daily.  2. Metoprolol 25 mg b.i.d.  3. Potassium 10 mEq daily.  4. Crestor 10 mg daily.  5. Famotidine 20 mg daily.  6. Norvasc 10 mg daily.  7. Aspirin 325 mg daily.   FAMILY HISTORY:  Mother died of uterine cancer.  Father died  traumatically.  There is no premature family history of cardiac disease.   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  Her  husband's she does not know but evidently works in [employer].  She currently lives with her son.  She says that she does not smoke or  use alcohol to excess.   REVIEW OF SYSTEMS:  She has had heartburn for some time and also some  abdominal discomfort related to her medications.  She has no history of  stroke or TIA, no genitourinary problems.  She is modestly obese and  does not get much in the way of regular exercise.   PHYSICAL EXAMINATION:  GENERAL:  She is a pleasant, middle-age white  female in no acute distress.  VITAL SIGNS: Blood pressure currently 110/70, pulse 80 and irregular.  SKIN:  Warm and dry.  HEENT:  EOMI.  PERRLA.  Conjunctivae and sclerae clear.  Fundi not  examined.  Oropharynx negative.  NECK:  Supple without masses, bruit, thyromegaly.  LUNGS:  Clear.  CARDIOVASCULAR:  Irregular rhythm.  Normal S1 and S2, no S3.  ABDOMEN:  Soft and nontender.  Peripheral  pulses 2+,  no edema noted.   A 12-lead EKG initially showed atrial fibrillation with a rapid  response.   Cardiac enzymes were negative.   I cannot really find an echocardiogram within the medical record.  This  will need to be assessed.   IMPRESSION:  1. Recurrent atrial fibrillation, symptomatic with palpitations,      shortness of breath, and chest discomfort.  2. Hypertensive heart disease.  3. Obesity.  4. Chest discomfort with negative cardiac enzymes and no EKG changes.   RECOMMENDATIONS:  1. I would increase her beta blocker therapy.  2. She will need to be anticoagulated with Coumadin.  3. Because of her chest pain, I would suggest evaluation for coronary      disease in light of atrial fibrillation which will make gating      difficult.  It may be best to just proceed directly to      catheterization.  4. I will keep her n.p.o. after midnight and let Dr. Elease Hashimoto decide      which way he wants to go with her  workup.  5. Long term, she likely will require Coumadin, good blood pressure      control.  She currently is complaining of a great deal of arm      discomfort related to infusion of intravenous potassium.  I would      stop this and try to give her more potassium.   Thank you for asking me to see her with you.  Dr. Elease Hashimoto will follow in  the morning.      Georga Hacking, M.D.  Electronically Signed     WST/MEDQ  D:  04/14/2007  T:  04/14/2007  Job:  [id number]   cc:   Vesta Mixer, M.D.  [facility]

## 2011-05-12 NOTE — H&P (Signed)
NAME:  Jacqueline, Petersen NO.:  [id number]   MEDICAL RECORD NO.:  [id number]          PATIENT TYPE:  INP   LOCATION:  1828                         FACILITY:  [facility]   PHYSICIAN:  Ulyses Amor, MD DATE OF BIRTH:  12-May-1956   DATE OF ADMISSION:  01/30/2007  DATE OF DISCHARGE:                              HISTORY & PHYSICAL   Jacqueline Petersen is a 55 year old black woman who is admitted to [facility]  for further management of new onset of atrial  fibrillation with rapid ventricular rate.   The patient, who has no past history of cardiac disease, was sent to the  emergency department with a three-day history of a fluttering sensation  in her chest.  This was not associated with pain, tightness, pressure,  heaviness, or squeezing.  She has not experienced any dyspnea, nor has  there been any syncope or near syncope though she has noted slight  lightheadedness.  Upon presentation to the emergency department, she was  found to be in atrial fibrillation with a rapid ventricular rate and she  is admitted for further evaluation and management.   The patient believes that she may have had brief episodes of a similar  fluttering in the past but this is the first time that she has had these  symptoms of a sustained nature.   There is no history of chest pain, myocardial infarction, coronary  artery disease, congestive heart failure, or arrhythmia.  She has a  number of risk factors for coronary artery disease including  hypertension and dyslipidemia.  There was no history of smoking,  diabetes mellitus, or family history of coronary artery disease. The  patient's only other medical problem is that of gastroesophageal reflux.   MEDICATIONS:  Crestor, brimonidine, hydrochlorothiazide, and Norvasc.   ALLERGIES:  None.   PAST SURGICAL HISTORY:  Hysterectomy.   SOCIAL HISTORY:  The patient does not work.  She lives with a son.  She  does not smoke  cigarettes.  She does not drink alcohol.   FAMILY HISTORY:  Mother died of uterine cancer.  Her father died in a  fire.  Family history is not notable for any other medical problems.   REVIEW OF SYSTEMS:  No problems related to her head, eyes, ears, nose,  mouth, throat, lungs, gastrointestinal system, genitourinary system, or  extremities.  There is no history of neurological or psychiatric  disorder.  There is no history of fever, chills, or weight loss.   PHYSICAL EXAMINATION:  VITAL SIGNS:  Blood pressure 127/84, pulse 125  and irregularly irregular.  Respirations 20, temperature 97.3.  GENERAL:  The patient is an obese, middle-aged black woman in no  discomfort.  She is alert, oriented, appropriate, and responsive.  HEENT:  Normal.  NECK:  Without thyromegaly or adenopathy.  Carotid pulses were palpable  bilaterally and without bruits.  CARDIAC:  Irregularly irregular rhythm.  No gallop, murmur, rub, or  click.  CHEST:  No chest wall tenderness was noted.  LUNGS:  Clear.  ABDOMEN:  Soft, nontender.  There was no mass, hepatosplenomegaly,  bruit, distention, rebound, guarding, or rigidity.  Bowel sounds were  normal.  BREASTS/RECTAL/GENITAL:  Not performed as they were not pertinent to the  reason for acute care hospitalization.  EXTREMITIES:  Without edema, deviation, or deformity.  Radial and  dorsalis pedis pulses were palpable bilaterally.  NEUROLOGIC:  Brief  screening neurological survey was unremarkable.   LABORATORY DATA:  Electrocardiogram revealed atrial fibrillation with a  ventricular rate of 130 beats per minute.  The chest radiograph had not  yet been performed.  The initial set of cardiac markers revealed a  myoglobin of 86.5, CK-MB 3.5, troponin less than 0.05.  Potassium was  3.2, BUN 12, creatinine 0.7. The remaining studies were pending at this  time of this dictation.   IMPRESSION:  1. New onset atrial fibrillation, with a rapid ventricular rate. The       atrial fibrillation is probably at least three days in duration.  2. Hypertension.  3. Dyslipidemia.  4. Gastroesophageal reflux.   PLAN:  1. Telemetry.  2. Serial cardiac enzymes.  3. Aspirin.  4  Intravenous heparin.  1. Intravenous diltiazem.  2. Fasting lipid profile.  3. Thyroid function tests.  4. Echocardiogram.  5. Further measures per Dr. Elease Hashimoto.      Ulyses Amor, MD  Electronically Signed     MSC/MEDQ  D:  01/31/2007  T:  01/31/2007  Job:  [id number]   cc:   Vesta Mixer, M.D.

## 2011-05-12 NOTE — Discharge Summary (Signed)
NAME:  Jacqueline Petersen, Jacqueline Petersen           ACCOUNT NO.:  [id number]   MEDICAL RECORD NO.:  [id number]          PATIENT TYPE:  INP   LOCATION:  6529                         FACILITY:  [facility]   PHYSICIAN:  Vesta Mixer, M.D. DATE OF BIRTH:  1956/10/16   DATE OF ADMISSION:  01/30/2007  DATE OF DISCHARGE:  02/01/2007                               DISCHARGE SUMMARY   DISCHARGE DIAGNOSES:  1. Atrial fibrillation with a rapid ventricular response.  2. Hyperlipidemia.  3. Hypertension.  4. Mild obesity.   DISCHARGE MEDICATIONS:  1. Hydrochlorothiazide 12.5 mg a day.  2. Metoprolol 25 mg p.o. b.i.d.  3. Potassium chloride 10 mEq a day.  4. Lipitor 40 mg a day.  5. Famotidine 20 mg twice a day.  6. Norvasc 10 mg a day.  7. Aspirin 325 mg a day.   DISPOSITION:  The patient will see Dr. Elease Hashimoto for a one-time followup in  1 or 2 weeks.  She will then follow up with Dr. Luciana Axe at [facility].   HISTORY:  [name] is a 55 year old female who was admitted with  atrial fibrillation to [facility].  Please see dictated H&P for  further details.   HOSPITAL COURSE:  PROBLEM #1 - ATRIAL FIBRILLATION:  The patient was  admitted with atrial fibrillation with a rapid ventricular response.  She was started on a Cardizem drip and converted to sinus rhythm after  being on the drip for approximately 12 hours.  She was started on  metoprolol and did not have any further episodes of atrial fibrillation.  She had an echocardiogram at the hospital, the results of which are  still pending.   PROBLEM #2 - HYPERTENSION:  The patient will be continued on Norvasc.  We have added a low dose of metoprolol to help her stay out of atrial  fibrillation.  This will need to be followed up by Dr. Luciana Axe.   PROBLEM #3 - HYPERLIPIDEMIA:  The patient was admitted on Crestor 20 mg  a day.  She states that the Crestor causes her lots of muscle aches and  pains.  She would like to go back on Lipitor.  We have started  her back  on Lipitor and she will follow up with this with Dr. Luciana Axe.   PROBLEM #4 - HYPOKALEMIA:  The patient's potassium on admission was 3.2.  We will add a low dose of potassium and she will follow up with Dr.  Luciana Axe.  All of her other medical problems are stable.           ______________________________  Vesta Mixer, M.D.     PJN/MEDQ  D:  02/01/2007  T:  02/01/2007  Job:  [id number]   cc:   Fanny Dance. Rankins, M.D.

## 2011-07-07 ENCOUNTER — Emergency Department (HOSPITAL_COMMUNITY): Payer: Self-pay

## 2011-07-07 ENCOUNTER — Emergency Department (HOSPITAL_COMMUNITY)
Admission: EM | Admit: 2011-07-07 | Discharge: 2011-07-07 | Disposition: A | Discharge status: Home / Self Care | Payer: Self-pay | Attending: Emergency Medicine | Admitting: Emergency Medicine

## 2011-07-07 ENCOUNTER — Encounter (HOSPITAL_COMMUNITY): Payer: Self-pay | Admitting: Radiology

## 2011-07-07 DIAGNOSIS — M545 Low back pain, unspecified: Secondary | ICD-10-CM | POA: Insufficient documentation

## 2011-07-07 DIAGNOSIS — I10 Essential (primary) hypertension: Secondary | ICD-10-CM | POA: Insufficient documentation

## 2011-07-07 DIAGNOSIS — E785 Hyperlipidemia, unspecified: Secondary | ICD-10-CM | POA: Insufficient documentation

## 2011-07-07 DIAGNOSIS — R109 Unspecified abdominal pain: Secondary | ICD-10-CM | POA: Insufficient documentation

## 2011-07-07 DIAGNOSIS — Z79899 Other long term (current) drug therapy: Secondary | ICD-10-CM | POA: Insufficient documentation

## 2011-07-07 DIAGNOSIS — I4891 Unspecified atrial fibrillation: Secondary | ICD-10-CM | POA: Insufficient documentation

## 2011-07-07 DIAGNOSIS — Z7982 Long term (current) use of aspirin: Secondary | ICD-10-CM | POA: Insufficient documentation

## 2011-07-07 DIAGNOSIS — K219 Gastro-esophageal reflux disease without esophagitis: Secondary | ICD-10-CM | POA: Insufficient documentation

## 2011-07-07 HISTORY — DX: Essential (primary) hypertension: I10

## 2011-07-07 HISTORY — DX: Unspecified atrial fibrillation: I48.91

## 2011-07-07 HISTORY — DX: Gastro-esophageal reflux disease without esophagitis: K21.9

## 2011-07-07 HISTORY — DX: Acquired absence of both cervix and uterus: Z90.710

## 2011-07-07 HISTORY — DX: Hyperlipidemia, unspecified: E78.5

## 2011-07-07 LAB — URINALYSIS, ROUTINE W REFLEX MICROSCOPIC
Leukocytes, UA: NEGATIVE
Nitrite: NEGATIVE
Specific Gravity, Urine: 1.007 (ref 1.005–1.030)
pH: 6.5 (ref 5.0–8.0)

## 2011-09-21 ENCOUNTER — Other Ambulatory Visit (HOSPITAL_COMMUNITY): Payer: Self-pay | Admitting: Family Medicine

## 2011-09-21 DIAGNOSIS — Z1231 Encounter for screening mammogram for malignant neoplasm of breast: Secondary | ICD-10-CM

## 2011-10-31 ENCOUNTER — Ambulatory Visit (HOSPITAL_COMMUNITY)
Admission: RE | Admit: 2011-10-31 | Discharge: 2011-10-31 | Disposition: A | Discharge status: Home / Self Care | Payer: Self-pay | Source: Ambulatory Visit | Attending: Family Medicine | Admitting: Family Medicine

## 2011-10-31 DIAGNOSIS — Z1231 Encounter for screening mammogram for malignant neoplasm of breast: Secondary | ICD-10-CM | POA: Insufficient documentation

## 2011-11-30 ENCOUNTER — Encounter (HOSPITAL_COMMUNITY): Payer: Self-pay | Admitting: Emergency Medicine

## 2011-11-30 ENCOUNTER — Emergency Department (HOSPITAL_COMMUNITY)
Admission: EM | Admit: 2011-11-30 | Discharge: 2011-12-01 | Disposition: A | Discharge status: Home / Self Care | Payer: Self-pay | Attending: Emergency Medicine | Admitting: Emergency Medicine

## 2011-11-30 DIAGNOSIS — J111 Influenza due to unidentified influenza virus with other respiratory manifestations: Secondary | ICD-10-CM | POA: Insufficient documentation

## 2011-11-30 DIAGNOSIS — K219 Gastro-esophageal reflux disease without esophagitis: Secondary | ICD-10-CM | POA: Insufficient documentation

## 2011-11-30 DIAGNOSIS — E785 Hyperlipidemia, unspecified: Secondary | ICD-10-CM | POA: Insufficient documentation

## 2011-11-30 DIAGNOSIS — L909 Atrophic disorder of skin, unspecified: Secondary | ICD-10-CM | POA: Insufficient documentation

## 2011-11-30 DIAGNOSIS — I4891 Unspecified atrial fibrillation: Secondary | ICD-10-CM | POA: Insufficient documentation

## 2011-11-30 DIAGNOSIS — I10 Essential (primary) hypertension: Secondary | ICD-10-CM | POA: Insufficient documentation

## 2011-11-30 NOTE — ED Notes (Signed)
PT. REPORTS ABSCESS "GROWTH " AT LOWER ABDOMEN FOR 6 MONTHS WITH DRAINAGE ,  ALSO REPORTS HEADACHE , CHILLS AND " FEELS TIRED".

## 2011-12-01 NOTE — ED Provider Notes (Signed)
History     CSN: [id number] Arrival date & time: 11/30/2011 10:35 PM   First MD Initiated Contact with Patient 12/01/11 0050      Chief Complaint  Patient presents with  . Abscess   cough  (Consider location/radiation/quality/duration/timing/severity/associated sxs/prior treatment) HPI This 55 year old female complains of one to 2 weeks of some nasal congestion a nonproductive cough occasional chills some body aches generalized fatigue and occasional loose stools which are nonbloody. She has no headache no confusion no rash no shortness of breath no abdominal pain no vomiting and no localized neurologic symptoms. She states she has had a small skin tag in her lower abdominal wall that she tied a string around within the last few months and the skin tag has not fallen off yet. [facility] they thought she might have abdominal wall abscess so they told her to come to the emergency department to see if that was the case. The patient has no redness swelling or pain to her skin tag area and no drainage. Past Medical History  Diagnosis Date  . Hypertension   . A-fib   . History of hysterectomy   . Acid reflux   . Hyperlipidemia     History reviewed. No pertinent past surgical history.  No family history on file.  History  Substance Use Topics  . Smoking status: Never Smoker   . Smokeless tobacco: Not on file  . Alcohol Use: Yes    OB History    Grav Para Term Preterm Abortions TAB SAB Ect Mult Living                  Review of Systems  Constitutional: Positive for chills. Negative for fever.       10 Systems reviewed and are negative for acute change except as noted in the HPI.  HENT: Positive for congestion and rhinorrhea. Negative for sore throat, neck pain and neck stiffness.   Eyes: Negative for discharge and redness.  Respiratory: Positive for cough. Negative for shortness of breath and wheezing.   Cardiovascular: Negative for chest pain.    Gastrointestinal: Positive for diarrhea. Negative for nausea, vomiting and abdominal pain.  Genitourinary: Negative for dysuria.  Musculoskeletal: Negative for back pain.  Skin: Negative for rash.  Neurological: Negative for dizziness, syncope, numbness and headaches.  Psychiatric/Behavioral:       No behavior change.    Allergies  Lipitor  Home Medications   Current Outpatient Rx  Name Route Sig Dispense Refill  . ASPIRIN EC 81 MG PO TBEC Oral Take 81 mg by mouth daily.      Marland Kitchen DILTIAZEM HCL ER BEADS 240 MG PO CP24 Oral Take 240 mg by mouth daily.      Marland Kitchen METOPROLOL TARTRATE 25 MG PO TABS Oral Take 25 mg by mouth 2 (two) times daily.      Marland Kitchen RANITIDINE HCL 150 MG PO TABS Oral Take 150 mg by mouth 2 (two) times daily.      Marland Kitchen ROSUVASTATIN CALCIUM 10 MG PO TABS Oral Take 10 mg by mouth daily.      Marland Kitchen VITAMIN B-1 100 MG PO TABS Oral Take 100 mg by mouth daily.        BP 145/94  Pulse 75  Temp(Src) 97.8 F (36.6 C) (Oral)  Resp 18  SpO2 96%  Physical Exam  Nursing note and vitals reviewed. Constitutional:       Awake, alert, nontoxic appearance.  HENT:  Head: Atraumatic.  Mouth/Throat: Oropharynx is clear and moist.  Eyes: Conjunctivae are normal. Pupils are equal, round, and reactive to light. Right eye exhibits no discharge. Left eye exhibits no discharge.  Neck: Neck supple.  Cardiovascular: Normal rate and regular rhythm.   No murmur heard. Pulmonary/Chest: Effort normal and breath sounds normal. No respiratory distress. She has no wheezes. She has no rales. She exhibits no tenderness.  Abdominal: Soft. Bowel sounds are normal. She exhibits no mass. There is no tenderness. There is no rebound and no guarding.  Musculoskeletal: She exhibits no tenderness.       Baseline ROM, no obvious new focal weakness.  Neurological:       Mental status and motor strength appears baseline for patient and situation.  Skin: No rash noted.       Lower abdominal wall has a small  superficial skin tag with string tied around it with minimal distal necrosis of the skin tag itself with no tenderness no swelling no fluctuance no drainage no erythema no excessive warmth and no suggestion of abscess or secondary cellulitis at this time  Psychiatric: She has a normal mood and affect.    ED Course  Procedures (including critical care time)  Labs Reviewed - No data to display No results found.   1. Influenza       MDM  I doubt any other EMC precluding discharge at this time including, but not necessarily limited to the following:abscess, SBI.        Hurman Horn, MD 12/01/11 [phone]

## 2012-01-20 ENCOUNTER — Emergency Department (HOSPITAL_COMMUNITY)
Admission: EM | Admit: 2012-01-20 | Discharge: 2012-01-21 | Disposition: A | Discharge status: Home / Self Care | Payer: Self-pay | Attending: Emergency Medicine | Admitting: Emergency Medicine

## 2012-01-20 ENCOUNTER — Encounter (HOSPITAL_COMMUNITY): Payer: Self-pay | Admitting: *Deleted

## 2012-01-20 ENCOUNTER — Other Ambulatory Visit: Payer: Self-pay

## 2012-01-20 DIAGNOSIS — I10 Essential (primary) hypertension: Secondary | ICD-10-CM | POA: Insufficient documentation

## 2012-01-20 DIAGNOSIS — Z79899 Other long term (current) drug therapy: Secondary | ICD-10-CM | POA: Insufficient documentation

## 2012-01-20 DIAGNOSIS — R002 Palpitations: Secondary | ICD-10-CM | POA: Insufficient documentation

## 2012-01-20 DIAGNOSIS — Z7982 Long term (current) use of aspirin: Secondary | ICD-10-CM | POA: Insufficient documentation

## 2012-01-20 DIAGNOSIS — I4891 Unspecified atrial fibrillation: Secondary | ICD-10-CM | POA: Insufficient documentation

## 2012-01-20 DIAGNOSIS — K219 Gastro-esophageal reflux disease without esophagitis: Secondary | ICD-10-CM | POA: Insufficient documentation

## 2012-01-20 DIAGNOSIS — E785 Hyperlipidemia, unspecified: Secondary | ICD-10-CM | POA: Insufficient documentation

## 2012-01-20 NOTE — ED Provider Notes (Signed)
History     CSN: [id number]  Arrival date & time 01/20/12  2154   First MD Initiated Contact with Patient 01/20/12 2306      Chief Complaint  Patient presents with  . Palpitations    (Consider location/radiation/quality/duration/timing/severity/associated sxs/prior treatment) HPI Patient presents to the emergency room with complaints of palpitations. She says it started last night. She has not had any problems with nausea or chest pain. She has had some symptoms of pressure in the head but that's been going on for a few days and is not particularly bothering her right now.  Patient saw her Dr. earlier this week and had her metoprolol discontinued because she thought she was having some side effects from it. The palpitations seem to start after she discontinued that. She denies any other symptoms. No vomiting or diarrhea. No numbness or weakness. No shortness of breath. Past Medical History  Diagnosis Date  . Hypertension   . A-fib   . History of hysterectomy   . Acid reflux   . Hyperlipidemia     History reviewed. No pertinent past surgical history.  History reviewed. No pertinent family history.  History  Substance Use Topics  . Smoking status: Never Smoker   . Smokeless tobacco: Not on file  . Alcohol Use: Yes    OB History    Grav Para Term Preterm Abortions TAB SAB Ect Mult Living                  Review of Systems  All other systems reviewed and are negative.    Allergies  Lipitor  Home Medications   Current Outpatient Rx  Name Route Sig Dispense Refill  . ASPIRIN EC 81 MG PO TBEC Oral Take 81 mg by mouth daily.      Marland Kitchen DILTIAZEM HCL ER BEADS 240 MG PO CP24 Oral Take 240 mg by mouth daily.      Marland Kitchen RANITIDINE HCL 150 MG PO TABS Oral Take 150 mg by mouth 2 (two) times daily.      Marland Kitchen ROSUVASTATIN CALCIUM 10 MG PO TABS Oral Take 10 mg by mouth daily.      Marland Kitchen VITAMIN B-1 100 MG PO TABS Oral Take 100 mg by mouth daily.        BP 152/87  Pulse 96  Temp(Src)  98.3 F (36.8 C) (Oral)  Resp 16  SpO2 100%  Physical Exam  Nursing note and vitals reviewed. Constitutional: She appears well-developed and well-nourished. No distress.  HENT:  Head: Normocephalic and atraumatic.  Right Ear: External ear normal.  Left Ear: External ear normal.  Eyes: Conjunctivae are normal. Right eye exhibits no discharge. Left eye exhibits no discharge. No scleral icterus.  Neck: Neck supple. No tracheal deviation present.  Cardiovascular: Normal rate, regular rhythm and intact distal pulses.   Pulmonary/Chest: Effort normal and breath sounds normal. No stridor. No respiratory distress. She has no wheezes. She has no rales.  Abdominal: Soft. Bowel sounds are normal. She exhibits no distension. There is no tenderness. There is no rebound and no guarding.  Musculoskeletal: She exhibits no edema and no tenderness.  Neurological: She is alert. She has normal strength. No sensory deficit. Cranial nerve deficit:  no gross defecits noted. She exhibits normal muscle tone. She displays no seizure activity. Coordination normal.  Skin: Skin is warm and dry. No rash noted.  Psychiatric: She has a normal mood and affect.    ED Course  Procedures (including critical care time)  Date: 01/20/2012  Rate: 95  Rhythm: normal sinus rhythm and premature atrial contractions (PAC)  QRS Axis: left  Intervals: normal except prolonged QT  ST/T Wave abnormalities: normal  Conduction Disutrbances:none  Narrative Interpretation: Minimal LVH criteria   Old EKG Reviewed: changes noted left axis deviation is new, PACs or   Labs Reviewed  CBC - Abnormal; Notable for the following:    WBC 11.0 (*)    All other components within normal limits  BASIC METABOLIC PANEL - Abnormal; Notable for the following:    Potassium 3.1 (*)    Glucose, Bld 124 (*)    All other components within normal limits  DIFFERENTIAL   No results found.   1. Palpitation       MDM  Patient's EKG did shows  PACs. There is no evidence of atrial fibrillation at this time. Patient states she had been on metoprolol and thinks that helped her with her palpitations in the past. She did request a dose of that this evening. She does have mild hypokalemia I did give her a dose of potassium while she was here in the emergency department although not sure that is significant enough to be causing her palpitations. At this time there does not appear to be evidence of a emergency medical condition. Patient discharged home I encouraged her to follow up with her doctor to  discuss her blood pressure medications       Celene Kras, MD 01/21/12 [phone]

## 2012-01-20 NOTE — ED Notes (Addendum)
Patient complaining of constant palpitations; patient states that they started last night and again tonight -- patient states that the palpitations start after she takes her "heart pills"; patient recently had medications switched around by her primary care physician last Friday.  Patient denies chest pain, but reports a "pressure" in her head -- states that she had informed her primary care physician of the headaches and head pressure during the last visit.  Patient alert and oriented x4; PERRL present.  Family at bedside.  Upon arrival to room, patient connected to continuous cardiac, pulse ox, and blood pressure monitor.  Dr. Lynelle Doctor at bedside.  Will continue to monitor.

## 2012-01-20 NOTE — ED Notes (Signed)
Patient currently sitting up in bed; no respiratory or acute distress noted.  Patient updated on plan of care; informed patient that we are currently waiting on lab to come and draw blood.  Patient has no other questions or concerns at this time; family present at bedside; will continue to monitor.

## 2012-01-20 NOTE — ED Notes (Signed)
Patient present to ED with complain of palpitations.  Patient states that it started last night. Patient denies any nausea or chest pain.  Stated that her MD recently took her off of one of her heart medications.  She also c/o pressure on her head.

## 2012-01-21 LAB — CBC
Hemoglobin: 13 g/dL (ref 12.0–15.0)
RBC: 4.64 MIL/uL (ref 3.87–5.11)
WBC: 11 10*3/uL — ABNORMAL HIGH (ref 4.0–10.5)

## 2012-01-21 LAB — DIFFERENTIAL
Lymphs Abs: 3.6 10*3/uL (ref 0.7–4.0)
Monocytes Relative: 5 % (ref 3–12)
Neutro Abs: 6.6 10*3/uL (ref 1.7–7.7)
Neutrophils Relative %: 60 % (ref 43–77)

## 2012-01-21 LAB — BASIC METABOLIC PANEL
BUN: 11 mg/dL (ref 6–23)
Chloride: 102 mEq/L (ref 96–112)
GFR calc Af Amer: >90 mL/min (ref >90)
Glucose, Bld: 124 mg/dL — ABNORMAL HIGH (ref 70–99)
Potassium: 3.1 mEq/L — ABNORMAL LOW (ref 3.5–5.1)
Sodium: 138 mEq/L (ref 135–145)

## 2012-01-21 MED ORDER — METOPROLOL TARTRATE 25 MG PO TABS
25.0000 mg | ORAL_TABLET | Freq: Once | ORAL | Status: AC
  Administered 2012-01-21: 25 mg via ORAL
  Filled 2012-01-21: qty 1

## 2012-01-21 MED ORDER — POTASSIUM CHLORIDE 20 MEQ PO PACK: 40.0000 meq | PACK | Freq: Once | ORAL | Status: DC

## 2012-01-21 MED ORDER — POTASSIUM CHLORIDE CRYS ER 20 MEQ PO TBCR
40.0000 meq | EXTENDED_RELEASE_TABLET | Freq: Once | ORAL | Status: AC
  Administered 2012-01-21: 40 meq via ORAL
  Filled 2012-01-21: qty 2

## 2012-01-21 NOTE — ED Notes (Signed)
Patient given discharge paperwork; went over discharge instructions with patient.  Instructed patient to follow up with primary care physician about blood pressure medications and to return to the ED for new, worsening, or concerning symptoms.

## 2012-01-21 NOTE — ED Notes (Signed)
Dr.Knapp at bedside  

## 2012-01-21 NOTE — ED Notes (Signed)
Lab at bedside

## 2012-02-01 ENCOUNTER — Encounter: Payer: Self-pay | Admitting: Cardiovascular Disease

## 2012-03-11 ENCOUNTER — Encounter: Payer: Self-pay | Admitting: *Deleted

## 2012-03-12 ENCOUNTER — Ambulatory Visit: Payer: Self-pay | Admitting: Cardiovascular Disease

## 2012-06-13 ENCOUNTER — Encounter (HOSPITAL_COMMUNITY): Payer: Self-pay | Admitting: Emergency Medicine

## 2012-06-13 ENCOUNTER — Emergency Department (HOSPITAL_COMMUNITY)
Admission: EM | Admit: 2012-06-13 | Discharge: 2012-06-13 | Disposition: A | Discharge status: Home / Self Care | Payer: Self-pay | Attending: Emergency Medicine | Admitting: Emergency Medicine

## 2012-06-13 DIAGNOSIS — K219 Gastro-esophageal reflux disease without esophagitis: Secondary | ICD-10-CM | POA: Insufficient documentation

## 2012-06-13 DIAGNOSIS — N189 Chronic kidney disease, unspecified: Secondary | ICD-10-CM | POA: Insufficient documentation

## 2012-06-13 DIAGNOSIS — E785 Hyperlipidemia, unspecified: Secondary | ICD-10-CM | POA: Insufficient documentation

## 2012-06-13 DIAGNOSIS — I129 Hypertensive chronic kidney disease with stage 1 through stage 4 chronic kidney disease, or unspecified chronic kidney disease: Secondary | ICD-10-CM | POA: Insufficient documentation

## 2012-06-13 DIAGNOSIS — R21 Rash and other nonspecific skin eruption: Secondary | ICD-10-CM | POA: Insufficient documentation

## 2012-06-13 DIAGNOSIS — Z992 Dependence on renal dialysis: Secondary | ICD-10-CM | POA: Insufficient documentation

## 2012-06-13 DIAGNOSIS — I4891 Unspecified atrial fibrillation: Secondary | ICD-10-CM | POA: Insufficient documentation

## 2012-06-13 MED ORDER — PREDNISONE 20 MG PO TABS
60.0000 mg | ORAL_TABLET | Freq: Once | ORAL | Status: AC
  Administered 2012-06-13: 60 mg via ORAL
  Filled 2012-06-13: qty 3

## 2012-06-13 MED ORDER — PREDNISONE 20 MG PO TABS: 40.0000 mg | ORAL_TABLET | Freq: Every day | ORAL | Status: DC

## 2012-06-13 NOTE — ED Provider Notes (Signed)
History     CSN: [id number]  Arrival date & time 06/13/12  0008   None     Chief Complaint  Patient presents with  . Rash    (Consider location/radiation/quality/duration/timing/severity/associated sxs/prior treatment) HPI Comments: Vision states about 2 weeks, ago.  She noticed a rash, in her scalp that is spread over the entire body, is itchy in nature, and oils in the family has an itchy rash that she does have a partner she has not [facility]  Patient is a 56 y.o. female presenting with rash. The history is provided by the patient.  Rash  The current episode started more than 1 week ago. The problem has been gradually worsening. The problem is associated with nothing. There has been no fever. The pain is at a severity of 0/10. The patient is experiencing no pain. The pain has been constant since onset. Associated symptoms include itching. Pertinent negatives include no blisters, no pain and no weeping. She has tried nothing for the symptoms.    Past Medical History  Diagnosis Date  . Hypertension   . A-fib   . History of hysterectomy   . Acid reflux   . Hyperlipidemia   . Chronic kidney failure     pt on dialysis  . Fatigue   . Personal history of long-term (current) use of anticoagulants   . Dyslipidemia   . GERD (gastroesophageal reflux disease)   . Hypokalemia   . Personal history of hypertensive heart disease   . Obesity     Past Surgical History  Procedure Date  . Partial hysterectomy   . Cardiac catheterization 04/15/2007    Est. EF of 50-55% -- Smooth and normal coronary arteries -- Normal left ventricular systolic function.  We will continue with further treatment of her atrial fibrillation       Family History  Problem Relation Age of Onset  . Coronary artery disease    . Hypertension      History  Substance Use Topics  . Smoking status: Never Smoker   . Smokeless tobacco: Not on file  . Alcohol Use: Yes    OB  History    Grav Para Term Preterm Abortions TAB SAB Ect Mult Living                  Review of Systems  Constitutional: Positive for chills. Negative for fever.  HENT: Negative for trouble swallowing.   Respiratory: Negative for shortness of breath.   Cardiovascular: Negative for leg swelling.  Gastrointestinal: Negative for nausea.  Skin: Positive for itching and rash.  Neurological: Negative for dizziness and weakness.  Psychiatric/Behavioral: The patient is nervous/anxious.     Allergies  Lipitor  Home Medications   Current Outpatient Rx  Name Route Sig Dispense Refill  . ASPIRIN EC 81 MG PO TBEC Oral Take 81 mg by mouth daily.      Marland Kitchen DILTIAZEM HCL ER BEADS 240 MG PO CP24 Oral Take 240 mg by mouth daily.      Marland Kitchen OMEPRAZOLE MAGNESIUM 20 MG PO TBEC Oral Take 20 mg by mouth daily.    Marland Kitchen ROSUVASTATIN CALCIUM 10 MG PO TABS Oral Take 10 mg by mouth daily.      Marland Kitchen VITAMIN B-1 100 MG PO TABS Oral Take 100 mg by mouth daily.      Marland Kitchen PREDNISONE 20 MG PO TABS Oral Take 2 tablets (40 mg total) by mouth daily. 10 tablet 0    BP 150/86  Pulse 81  Temp 98.3 F (36.8 C) (Oral)  Resp 18  SpO2 96%  Physical Exam  Constitutional: She appears well-developed and well-nourished.  HENT:  Head: Normocephalic.  Eyes: Pupils are equal, round, and reactive to light.  Neck: Normal range of motion.  Cardiovascular: Normal rate.   Pulmonary/Chest: Effort normal.  Abdominal: Soft.  Musculoskeletal: Normal range of motion.  Neurological: She is alert.  Skin:       Diffuse raised, flesh-colored lesions over face, neck, trunk, arms, legs.  There are small, without surrounding erythema or signs of infection    ED Course  Procedures (including critical care time)  Labs Reviewed - No data to display No results found.   1. Rash and nonspecific skin eruption       MDM   Nonspecific skin eruptions that started in the scalp, and spread over the entire body, without signs of  infection        Arman Filter, NP 06/17/12 1950

## 2012-06-13 NOTE — ED Notes (Signed)
PT. REPORTS GENERALIZED ITCHY RASHES FOR 2 WEEKS , RESPIRATIONS UNLABORED , AIRWAY INTACT.

## 2012-06-17 NOTE — ED Provider Notes (Signed)
Medical screening examination/treatment/procedure(s) were performed by non-physician practitioner and as supervising physician I was immediately available for consultation/collaboration.   Dione Booze, MD 06/17/12 [phone]

## 2012-06-22 ENCOUNTER — Emergency Department (HOSPITAL_COMMUNITY)
Admission: EM | Admit: 2012-06-22 | Discharge: 2012-06-22 | Disposition: A | Discharge status: Home / Self Care | Payer: Self-pay | Attending: Emergency Medicine | Admitting: Emergency Medicine

## 2012-06-22 ENCOUNTER — Emergency Department (HOSPITAL_COMMUNITY): Payer: Self-pay

## 2012-06-22 ENCOUNTER — Encounter (HOSPITAL_COMMUNITY): Payer: Self-pay | Admitting: Family Medicine

## 2012-06-22 DIAGNOSIS — R42 Dizziness and giddiness: Secondary | ICD-10-CM | POA: Insufficient documentation

## 2012-06-22 DIAGNOSIS — I1310 Hypertensive heart and chronic kidney disease without heart failure, with stage 1 through stage 4 chronic kidney disease, or unspecified chronic kidney disease: Secondary | ICD-10-CM | POA: Insufficient documentation

## 2012-06-22 DIAGNOSIS — N189 Chronic kidney disease, unspecified: Secondary | ICD-10-CM | POA: Insufficient documentation

## 2012-06-22 DIAGNOSIS — R5381 Other malaise: Secondary | ICD-10-CM | POA: Insufficient documentation

## 2012-06-22 DIAGNOSIS — Z7901 Long term (current) use of anticoagulants: Secondary | ICD-10-CM | POA: Insufficient documentation

## 2012-06-22 DIAGNOSIS — R002 Palpitations: Secondary | ICD-10-CM

## 2012-06-22 DIAGNOSIS — Z7982 Long term (current) use of aspirin: Secondary | ICD-10-CM | POA: Insufficient documentation

## 2012-06-22 DIAGNOSIS — E785 Hyperlipidemia, unspecified: Secondary | ICD-10-CM | POA: Insufficient documentation

## 2012-06-22 DIAGNOSIS — I4891 Unspecified atrial fibrillation: Secondary | ICD-10-CM | POA: Insufficient documentation

## 2012-06-22 DIAGNOSIS — R5383 Other fatigue: Secondary | ICD-10-CM | POA: Insufficient documentation

## 2012-06-22 DIAGNOSIS — Z79899 Other long term (current) drug therapy: Secondary | ICD-10-CM | POA: Insufficient documentation

## 2012-06-22 LAB — URINALYSIS, ROUTINE W REFLEX MICROSCOPIC
Glucose, UA: NEGATIVE mg/dL
Ketones, ur: NEGATIVE mg/dL
Leukocytes, UA: NEGATIVE
Nitrite: NEGATIVE
Specific Gravity, Urine: 1.011 (ref 1.005–1.030)
pH: 6 (ref 5.0–8.0)

## 2012-06-22 LAB — POCT I-STAT, CHEM 8
BUN: 8 mg/dL (ref 6–23)
Chloride: 102 mEq/L (ref 96–112)
HCT: 46 % (ref 36.0–46.0)
Potassium: 3 mEq/L — ABNORMAL LOW (ref 3.5–5.1)
Sodium: 140 mEq/L (ref 135–145)

## 2012-06-22 LAB — URINE MICROSCOPIC-ADD ON

## 2012-06-22 LAB — POCT I-STAT TROPONIN I: Troponin i, poc: 0 ng/mL (ref 0.00–0.08)

## 2012-06-22 MED ORDER — POTASSIUM CHLORIDE CRYS ER 20 MEQ PO TBCR
20.0000 meq | EXTENDED_RELEASE_TABLET | Freq: Once | ORAL | Status: AC
  Administered 2012-06-22: 20 meq via ORAL
  Filled 2012-06-22: qty 1

## 2012-06-22 NOTE — ED Notes (Signed)
Pt states that she has felt "pounding or drumming" throughout her body-but mostly in her chest-for the past week. Pt states that the last time this occurred she was noted to be in Afib. Pt denies any CP, but states she did feel SOB when she first started feeling it at beginning of week. Currently denies any SOB. Pt states that she has also felt inc in lethargy x1wk. NAD distress noted. Monitor reads NSR.

## 2012-06-22 NOTE — ED Provider Notes (Signed)
I saw and evaluated the patient, reviewed the resident's note and I agree with the findings and plan, ekg interpretation (reviewed by me)  RRR at this time.Denies sob now.  Denies h/o VTE in self or family. No recent hosp/surg/immob. No h/o cancer. Denies exogenous hormone use, no leg pain or swelling. Sinus rhythm on the monitor, EKG. Asx at this time. Pt self pay and no cardiologist at this time. Resident d/w cardiology who will f/u in clinic. No new medications at this time. No EMC precluding discharge at this time. Given Precautions for return. PMD f/u.   Forbes Cellar, MD 06/22/12 1742

## 2012-06-22 NOTE — ED Notes (Signed)
Patient verbalized discharge instructions without questions.  Patient's son was here to take patient home.  Vitals are recorded.  No Further action taken.

## 2012-06-22 NOTE — ED Provider Notes (Signed)
History     CSN: [id number]  Arrival date & time 06/22/12  1339   First MD Initiated Contact with Patient 06/22/12 1517      Chief Complaint  Patient presents with  . Fatigue  . Dizziness  . Atrial Fibrillation    (Consider location/radiation/quality/duration/timing/severity/associated sxs/prior treatment) HPI  56 year old female past history of hypertension,  atrial fibrillation,  chronic kidney disease presenting with a one-week history of heart palpitations. These palpitations feel like they shake her whole body. He been intermittent throughout the day and can last up to an hour at a time up to 4 or 5 episodes per day. When initially started, she noticed some mild dizziness and shortness of breath which resolved within 20 minutes. After that she's had no similar symptoms. She denies any fevers, meaningful cough, weakness or numbness. He denies any new joint pain or headache. She denies any difficulty understanding speech or difficulty speaking. Her vital signs are stable on arrival.  She calls her illness moderate.   Past Medical History  Diagnosis Date  . Hypertension   . A-fib   . History of hysterectomy   . Acid reflux   . Hyperlipidemia   . Chronic kidney failure     pt on dialysis  . Fatigue   . Personal history of long-term (current) use of anticoagulants   . Dyslipidemia   . GERD (gastroesophageal reflux disease)   . Hypokalemia   . Personal history of hypertensive heart disease   . Obesity     Past Surgical History  Procedure Date  . Partial hysterectomy   . Cardiac catheterization 04/15/2007    Est. EF of 50-55% -- Smooth and normal coronary arteries -- Normal left ventricular systolic function.  We will continue with further treatment of her atrial fibrillation       Family History  Problem Relation Age of Onset  . Coronary artery disease    . Hypertension      History  Substance Use Topics  . Smoking status: Never Smoker   . Smokeless tobacco: Not  on file  . Alcohol Use: Yes    OB History    Grav Para Term Preterm Abortions TAB SAB Ect Mult Living                  Review of Systems Constitutional: Negative for fever and chills.  HENT: Negative for ear pain, sore throat and trouble swallowing.   Eyes: Negative for pain and visual disturbance.  Respiratory: Negative for cough and POS shortness of breath (fleeting, now resolved).   Cardiovascular: Negative for chest pain and leg swelling.  Gastrointestinal: Negative for nausea, vomiting, abdominal pain and diarrhea.  Genitourinary: Negative for dysuria, urgency and frequency.  Musculoskeletal: Negative for back pain and joint swelling.  Skin: Negative for rash and wound.  Neurological: POS for dizziness, neg syncope, speech difficulty, weakness and numbness.     Allergies  Lipitor  Home Medications   Current Outpatient Rx  Name Route Sig Dispense Refill  . ASPIRIN EC 81 MG PO TBEC Oral Take 81 mg by mouth daily.      Marland Kitchen DILTIAZEM HCL ER BEADS 240 MG PO CP24 Oral Take 240 mg by mouth daily.      Marland Kitchen OMEPRAZOLE MAGNESIUM 20 MG PO TBEC Oral Take 20 mg by mouth daily.    Marland Kitchen PREDNISONE 20 MG PO TABS Oral Take 40 mg by mouth daily.    Marland Kitchen ROSUVASTATIN CALCIUM 10 MG PO TABS Oral Take 10 mg  by mouth daily.      Marland Kitchen VITAMIN B-1 100 MG PO TABS Oral Take 100 mg by mouth daily.        BP 138/64  Pulse 87  Temp 98.4 F (36.9 C) (Oral)  Resp 17  SpO2 99%  Physical Exam Consitutional: Pt in no acute distress.   Head: Normocephalic and atraumatic.  Eyes: Extraocular motion intact, no scleral icterus Neck: Supple without meningismus, mass, or overt JVD Respiratory: Effort normal and breath sounds normal. No respiratory distress. CV: Heart regular rate and rhythm, no obvious murmurs.  Pulses +2 and symmetric Abdomen: Soft, non-tender, non-distended MSK: Extremities are atraumatic without deformity, ROM intact Skin: Warm, dry, intact Neuro: Alert and oriented, no motor deficit  noted.  PERRL.  Reflexes normal BLE, no clonus.  Hips, knee and ankle, arm and wrist strength maintained.  FTN and RAM normal.  CNs 2-12 normal.  Psychiatric: Mood and affect are normal  EKG:  Rate: 87 Rythym  Normal Sinus  Interval  134 ms. Axis: LAD No gross conduction abnormalities appreciated.  No gross ST or T-wave abnormalities appreciated.  Unchanged.   ED Course  Procedures (including critical care time)  Labs Reviewed  URINALYSIS, ROUTINE W REFLEX MICROSCOPIC - Abnormal; Notable for the following:    Hgb urine dipstick TRACE (*)     All other components within normal limits  POCT I-STAT, CHEM 8 - Abnormal; Notable for the following:    Potassium 3.0 (*)     Hemoglobin 15.6 (*)     All other components within normal limits  POCT I-STAT TROPONIN I  URINE MICROSCOPIC-ADD ON  TSH   Dg Chest 2 View  06/22/2012  *RADIOLOGY REPORT*  Clinical Data: Heart pounding for 4 days.  Dizziness.  CHEST - 2 VIEW  Comparison: 07/07/2011  Findings: The heart size and mediastinal contours are within normal limits.  Both lungs are clear.  The visualized skeletal structures are unremarkable.  IMPRESSION: Negative examination.  Original Report Authenticated By: Rosealee Albee, M.D.     1. Palpitations       MDM    Patient looks very well exam is at rest and comfortable. Normal vital signs. Nondiaphoretic. Fully oriented pleasant female.  Complaint suggests atrial fibrillation versus PVCs. No complaint of chest pain for the past week. Poor story for acute coronary syndrome. 5 years from patent coronaries on catheterization.   Basic laboratory workup. Chest x-ray. EKG. 1 troponin.   Normal workup. The patient feels well. Vital signs remained stable. Discussed the case with cardiology on call. Cardiology will see your early in the upcoming week. They recommended changes to current therapy of diltiazem 240 mg per 24 hours and aspirin 81 mg per 24 hours.  PT DC home stable.  Discussed with pt  the clinical impression, treatment in the ED, and follow up plan.  We alslo discussed the indications for returning to the ED, which include shortness or breath, confusion, fever, new weakness or numbness, chest pain, or any other concerning symptom.  The pt understood the treatment and plan, is stable, and is able to leave the ED.           Larrie Kass, MD 06/22/12 1731

## 2012-06-22 NOTE — ED Notes (Signed)
Per pt she has had the sensation of pounding through out her whole body for about 1 week. Denies chest pain, SOB. sts she feels tired and some dizziness

## 2012-06-22 NOTE — Discharge Instructions (Signed)
Follow up as written above.  See your doctor immediately--or return to the ED--with any new or troubling symptoms including fevers, weakness, new chest pain, shortness or breath, numbness, or any other concerning symptom.  Palpitations     A palpitation is the feeling that your heartbeat is irregular or is faster than normal. Although this is frightening, it usually is not serious. Palpitations may be caused by excesses of smoking, caffeine, or alcohol. They are also brought on by stress and anxiety. Sometimes, they are caused by heart disease. Unless otherwise noted, your caregiver did not find any signs of serious illness at this time. HOME CARE INSTRUCTIONS  To help prevent palpitations:  Drink decaffeinated coffee, tea, and soda pop. Avoid chocolate.   If you smoke or drink alcohol, quit or cut down as much as possible.   Reduce your stress or anxiety level. Biofeedback, yoga, or meditation will help you relax. Physical activity such as swimming, jogging, or walking also may be helpful.  SEEK MEDICAL CARE IF:   You continue to have a fast heartbeat.   Your palpitations occur more often.  SEEK IMMEDIATE MEDICAL CARE IF: You develop chest pain, shortness of breath, severe headache, dizziness, or fainting. Document Released: 12/08/2000 Document Revised: 11/30/2011 Document Reviewed: 02/07/2008 [facility] Patient Information 2012 [place], [place].

## 2012-10-23 ENCOUNTER — Emergency Department (HOSPITAL_COMMUNITY)
Admission: EM | Admit: 2012-10-23 | Discharge: 2012-10-23 | Disposition: A | Discharge status: Home / Self Care | Payer: Self-pay | Attending: Emergency Medicine | Admitting: Emergency Medicine

## 2012-10-23 ENCOUNTER — Encounter (HOSPITAL_COMMUNITY): Payer: Self-pay | Admitting: Emergency Medicine

## 2012-10-23 DIAGNOSIS — Z8639 Personal history of other endocrine, nutritional and metabolic disease: Secondary | ICD-10-CM | POA: Insufficient documentation

## 2012-10-23 DIAGNOSIS — K219 Gastro-esophageal reflux disease without esophagitis: Secondary | ICD-10-CM | POA: Insufficient documentation

## 2012-10-23 DIAGNOSIS — J3489 Other specified disorders of nose and nasal sinuses: Secondary | ICD-10-CM | POA: Insufficient documentation

## 2012-10-23 DIAGNOSIS — Z792 Long term (current) use of antibiotics: Secondary | ICD-10-CM | POA: Insufficient documentation

## 2012-10-23 DIAGNOSIS — I4891 Unspecified atrial fibrillation: Secondary | ICD-10-CM | POA: Insufficient documentation

## 2012-10-23 DIAGNOSIS — H9209 Otalgia, unspecified ear: Secondary | ICD-10-CM | POA: Insufficient documentation

## 2012-10-23 DIAGNOSIS — E785 Hyperlipidemia, unspecified: Secondary | ICD-10-CM | POA: Insufficient documentation

## 2012-10-23 DIAGNOSIS — R05 Cough: Secondary | ICD-10-CM | POA: Insufficient documentation

## 2012-10-23 DIAGNOSIS — Z79899 Other long term (current) drug therapy: Secondary | ICD-10-CM | POA: Insufficient documentation

## 2012-10-23 DIAGNOSIS — R110 Nausea: Secondary | ICD-10-CM | POA: Insufficient documentation

## 2012-10-23 DIAGNOSIS — R51 Headache: Secondary | ICD-10-CM | POA: Insufficient documentation

## 2012-10-23 DIAGNOSIS — Z862 Personal history of diseases of the blood and blood-forming organs and certain disorders involving the immune mechanism: Secondary | ICD-10-CM | POA: Insufficient documentation

## 2012-10-23 DIAGNOSIS — R059 Cough, unspecified: Secondary | ICD-10-CM | POA: Insufficient documentation

## 2012-10-23 DIAGNOSIS — E669 Obesity, unspecified: Secondary | ICD-10-CM | POA: Insufficient documentation

## 2012-10-23 DIAGNOSIS — R5381 Other malaise: Secondary | ICD-10-CM | POA: Insufficient documentation

## 2012-10-23 DIAGNOSIS — N189 Chronic kidney disease, unspecified: Secondary | ICD-10-CM | POA: Insufficient documentation

## 2012-10-23 DIAGNOSIS — I1310 Hypertensive heart and chronic kidney disease without heart failure, with stage 1 through stage 4 chronic kidney disease, or unspecified chronic kidney disease: Secondary | ICD-10-CM | POA: Insufficient documentation

## 2012-10-23 DIAGNOSIS — Z7982 Long term (current) use of aspirin: Secondary | ICD-10-CM | POA: Insufficient documentation

## 2012-10-23 DIAGNOSIS — J069 Acute upper respiratory infection, unspecified: Secondary | ICD-10-CM | POA: Insufficient documentation

## 2012-10-23 DIAGNOSIS — R509 Fever, unspecified: Secondary | ICD-10-CM | POA: Insufficient documentation

## 2012-10-23 MED ORDER — BENZOCAINE 20 % MT SOLN
Freq: Once | OROMUCOSAL | Status: DC
  Filled 2012-10-23: qty 57

## 2012-10-23 MED ORDER — DILTIAZEM HCL ER COATED BEADS 240 MG PO TB24: 240.0000 mg | ORAL_TABLET | Freq: Every day | ORAL | Status: DC

## 2012-10-23 MED ORDER — PSEUDOEPHEDRINE-GUAIFENESIN ER 60-600 MG PO TB12: 1.0000 | ORAL_TABLET | Freq: Two times a day (BID) | ORAL | Status: DC

## 2012-10-23 MED ORDER — BENZONATATE 100 MG PO CAPS: 100.0000 mg | ORAL_CAPSULE | Freq: Three times a day (TID) | ORAL | Status: DC | PRN

## 2012-10-23 MED ORDER — ACETAMINOPHEN 325 MG PO TABS
650.0000 mg | ORAL_TABLET | Freq: Once | ORAL | Status: AC
  Administered 2012-10-23: 650 mg via ORAL
  Filled 2012-10-23: qty 2

## 2012-10-23 MED ORDER — DM-GUAIFENESIN ER 30-600 MG PO TB12: 1.0000 | ORAL_TABLET | Freq: Two times a day (BID) | ORAL | Status: DC

## 2012-10-23 MED ORDER — BENZOCAINE (TOPICAL) 20 % EX AERO: INHALATION_SPRAY | Freq: Four times a day (QID) | CUTANEOUS | Status: DC | PRN

## 2012-10-23 NOTE — ED Provider Notes (Signed)
  I performed a history and physical examination of Jacqueline Petersen and discussed her management with Dr. Konrad Dolores.  I agree with the history, physical, assessment, and plan of care, with the following exceptions: None    Claris Pech, Elvis Coil, MD 10/23/12 [phone]

## 2012-10-23 NOTE — ED Provider Notes (Signed)
History     CSN: [id number]  Arrival date & time 10/23/12  4098   First MD Initiated Contact with Patient 10/23/12 0920      Chief Complaint  Patient presents with  . Sore Throat  . Fatigue  . Otalgia    (Consider location/radiation/quality/duration/timing/severity/associated sxs/prior treatment) HPI CC: Sore throat  Sore throat: Started on Sunday. Associated with rhinorrhea, cough, general malaise, painful to swallow, ear pain, subjective fevers, HA, and nausea, and decreased PO. Grandson visited Friday and Saturday w/ similar symptoms.  Some relief w/ throat lozanges and tylenol. Denies CP, SOB, rash, syncope, lightheadedness.    Past Medical History  Diagnosis Date  . Hypertension   . A-fib   . History of hysterectomy   . Acid reflux   . Hyperlipidemia   . Chronic kidney failure     pt on dialysis  . Fatigue   . Personal history of long-term (current) use of anticoagulants   . Dyslipidemia   . GERD (gastroesophageal reflux disease)   . Hypokalemia   . Personal history of hypertensive heart disease   . Obesity     Past Surgical History  Procedure Date  . Partial hysterectomy   . Cardiac catheterization 04/15/2007    Est. EF of 50-55% -- Smooth and normal coronary arteries -- Normal left ventricular systolic function.  We will continue with further treatment of her atrial fibrillation     . Abdominal hysterectomy     Family History  Problem Relation Age of Onset  . Coronary artery disease    . Hypertension      History  Substance Use Topics  . Smoking status: Never Smoker   . Smokeless tobacco: Not on file  . Alcohol Use: No    OB History    Grav Para Term Preterm Abortions TAB SAB Ect Mult Living                  Review of Systems  All other systems reviewed and are negative.    Allergies  Lipitor  Home Medications   Current Outpatient Rx  Name Route Sig Dispense Refill  . ASPIRIN EC 81 MG PO TBEC Oral Take 81 mg by mouth daily.        . B COMPLEX-C PO TABS Oral Take 1 tablet by mouth daily.    Marland Kitchen METOPROLOL SUCCINATE ER 100 MG PO TB24 Oral Take 100 mg by mouth at bedtime. Take with or immediately following a meal.    . ADULT MULTIVITAMIN W/MINERALS CH Oral Take 1 tablet by mouth daily.    Marland Kitchen OMEPRAZOLE MAGNESIUM 20 MG PO TBEC Oral Take 20 mg by mouth daily.    Marland Kitchen ROSUVASTATIN CALCIUM 10 MG PO TABS Oral Take 10 mg by mouth at bedtime.       BP 143/81  Pulse 81  Temp 98.7 F (37.1 C)  Resp 18  SpO2 97%  Physical Exam  Nursing note and vitals reviewed. Constitutional: She is oriented to person, place, and time. She appears well-developed and well-nourished. No distress.  HENT:  Head: Normocephalic and atraumatic.  Right Ear: External ear normal.  Left Ear: External ear normal.  Mouth/Throat: Oropharyngeal exudate present.       Mild anterior cervical lymphadenopathy  Eyes: Pupils are equal, round, and reactive to light.  Neck: Normal range of motion.  Cardiovascular: Normal rate, normal heart sounds and intact distal pulses.   Pulmonary/Chest: Effort normal and breath sounds normal. No respiratory distress. She has no wheezes. She  has no rales. She exhibits no tenderness.  Abdominal: Normal appearance. She exhibits no distension.  Musculoskeletal: Normal range of motion.  Neurological: She is alert and oriented to person, place, and time. No cranial nerve deficit.  Skin: Skin is warm and dry. No rash noted.  Psychiatric: She has a normal mood and affect. Her behavior is normal.    ED Course  Procedures (including critical care time)  Labs Reviewed - No data to display No results found.   No diagnosis found.    MDM  56yo AAF w/ likely viral URI. VS stable. Afebrile. PE reassuring that pt has not decompensated. - Stable for DC - Tylenol PRN, huricaine spray - fluids and rest - Tessalon pearles for cough - Mucinex D. Risks of elevating BP discussed - f/u w/ PCP - will also refill pt diltiazem as pt  former health serve pt w/o current PCP and nearly out of medication  Shelly Flatten, MD Family Medicine PGY-2 10/23/2012, 10:32 AM      Ozella Rocks, MD 10/23/12 1049

## 2012-10-23 NOTE — ED Notes (Addendum)
Pt stated that "My throat feels so raw and its making me weak and my ears hurt." Pt stated "I got from hot to cold, hold to cold". Pt has been taking 2 Tylenol every 5 hrs to try to help with symptoms with no relief and "salt water gargle". Symptoms have been going on x 1 week. Pt c/o nausea. Denies V/D, shortness of breath.

## 2012-11-27 ENCOUNTER — Other Ambulatory Visit (HOSPITAL_COMMUNITY): Payer: Self-pay | Admitting: Physician Assistant

## 2012-11-27 DIAGNOSIS — Z1231 Encounter for screening mammogram for malignant neoplasm of breast: Secondary | ICD-10-CM

## 2012-11-28 ENCOUNTER — Ambulatory Visit (HOSPITAL_COMMUNITY)
Admission: RE | Admit: 2012-11-28 | Discharge: 2012-11-28 | Disposition: A | Discharge status: Home / Self Care | Payer: Self-pay | Source: Ambulatory Visit | Attending: Physician Assistant | Admitting: Physician Assistant

## 2012-11-28 DIAGNOSIS — Z1231 Encounter for screening mammogram for malignant neoplasm of breast: Secondary | ICD-10-CM | POA: Insufficient documentation

## 2013-01-28 ENCOUNTER — Ambulatory Visit (HOSPITAL_BASED_OUTPATIENT_CLINIC_OR_DEPARTMENT_OTHER): Payer: No Typology Code available for payment source | Attending: Family Medicine | Admitting: Radiology

## 2013-01-28 VITALS — Ht 65.0 in | Wt 240.0 lb

## 2013-01-28 DIAGNOSIS — G4733 Obstructive sleep apnea (adult) (pediatric): Secondary | ICD-10-CM | POA: Insufficient documentation

## 2013-01-28 DIAGNOSIS — Z9989 Dependence on other enabling machines and devices: Secondary | ICD-10-CM

## 2013-02-01 DIAGNOSIS — R0989 Other specified symptoms and signs involving the circulatory and respiratory systems: Secondary | ICD-10-CM

## 2013-02-01 DIAGNOSIS — R0609 Other forms of dyspnea: Secondary | ICD-10-CM

## 2013-02-01 DIAGNOSIS — G4733 Obstructive sleep apnea (adult) (pediatric): Secondary | ICD-10-CM

## 2013-02-01 NOTE — Procedures (Signed)
NAME:  Jacqueline Petersen, Jacqueline Petersen           ACCOUNT NO.:  [id number]  MEDICAL RECORD NO.:  [id number]          PATIENT TYPE:  OUT  LOCATION:  SLEEP CENTER                 FACILITY:  [facility]  PHYSICIAN:  Nechemia Chiappetta D. Maple Hudson, MD, FCCP, FACPDATE OF BIRTH:  01/15/58  DATE OF STUDY:  01/28/2013                           NOCTURNAL POLYSOMNOGRAM  REFERRING PHYSICIAN:  Stacie Acres. Cliffton Asters, M.D.  REFERRING PHYSICIAN:  Stacie Acres. Cliffton Asters, M.D.  INDICATION FOR STUDY:  Hypersomnia with sleep apnea.  EPWORTH SLEEPINESS SCORE:  21/24, BMI 39.9, weight 240 pounds, height 65 inches, neck 15 inches.  MEDICATIONS:  Home medications charted and reviewed.  SLEEP ARCHITECTURE:  Split study protocol.  During the diagnostic phase, total sleep time 143.5 minutes with sleep efficiency 91.4%.  Stage I was 0.7%, stage II 69.7%, stage III 0.7%, REM 28.9% of total sleep time. Sleep latency 8 minutes, REM latency 51 minutes, awake after sleep onset 0.5 minutes.  Arousal index 8.8.  Bedtime medication:  None.  RESPIRATORY DATA:  Split study protocol.  Apnea/hypopnea index (AHI) 33.4 per hour.  A total of 80 events was scored including 17 obstructive apneas, 63 hypopneas.  Events were associated with supine sleep position.  REM AHI 94 per hour.  CPAP was titrated to 9 CWP, AHI 1.1 per hour.  She wore a small ResMed Mirage Quattro full-face mask with heated humidifier and an EPR of 1.  OXYGEN DATA:  Before CPAP, snoring was moderate with oxygen desaturation to a nadir of 77% on room air.  With CPAP titration, snoring was prevented and mean oxygen saturation held 96.9% on room air.  CARDIAC DATA:  Sinus rhythm with occasional PAC.  MOVEMENT/PARASOMNIA:  No significant movement disturbance.  Bathroom x1.  IMPRESSION/RECOMMENDATION: 1. Severe obstructive sleep apnea/hypopnea syndrome, AHI 33.4 per hour     with supine events.  Moderate snoring with oxygen desaturation to a     nadir of 77% on room air. 2. Successful CPAP  titration to 9 CWP, AHI 1.1 per hour.  She wore a     small ResMed Mirage Quattro full-face     mask with heated humidifier and EPR of 1.  Snoring was prevented     and mean oxygen saturation held 96.9% on room air with CPAP.     Kamill Fulbright D. Maple Hudson, MD, [facility], FACP Diplomate, American Board of Sleep Medicine    CDY/MEDQ  D:  02/01/2013 10:26:16  T:  02/01/2013 12:05:32  Job:  [id number]

## 2013-10-13 ENCOUNTER — Emergency Department (HOSPITAL_COMMUNITY)
Admission: EM | Admit: 2013-10-13 | Discharge: 2013-10-13 | Disposition: A | Discharge status: Home / Self Care | Payer: No Typology Code available for payment source | Attending: Emergency Medicine | Admitting: Emergency Medicine

## 2013-10-13 ENCOUNTER — Emergency Department (HOSPITAL_COMMUNITY): Payer: No Typology Code available for payment source

## 2013-10-13 ENCOUNTER — Encounter (HOSPITAL_COMMUNITY): Payer: Self-pay | Admitting: Emergency Medicine

## 2013-10-13 DIAGNOSIS — Z7902 Long term (current) use of antithrombotics/antiplatelets: Secondary | ICD-10-CM | POA: Insufficient documentation

## 2013-10-13 DIAGNOSIS — Z7982 Long term (current) use of aspirin: Secondary | ICD-10-CM | POA: Insufficient documentation

## 2013-10-13 DIAGNOSIS — J029 Acute pharyngitis, unspecified: Secondary | ICD-10-CM | POA: Insufficient documentation

## 2013-10-13 DIAGNOSIS — N186 End stage renal disease: Secondary | ICD-10-CM | POA: Insufficient documentation

## 2013-10-13 DIAGNOSIS — R5381 Other malaise: Secondary | ICD-10-CM | POA: Insufficient documentation

## 2013-10-13 DIAGNOSIS — J069 Acute upper respiratory infection, unspecified: Secondary | ICD-10-CM | POA: Insufficient documentation

## 2013-10-13 DIAGNOSIS — K219 Gastro-esophageal reflux disease without esophagitis: Secondary | ICD-10-CM | POA: Insufficient documentation

## 2013-10-13 DIAGNOSIS — IMO0002 Reserved for concepts with insufficient information to code with codable children: Secondary | ICD-10-CM | POA: Insufficient documentation

## 2013-10-13 DIAGNOSIS — I1311 Hypertensive heart and chronic kidney disease without heart failure, with stage 5 chronic kidney disease, or end stage renal disease: Secondary | ICD-10-CM | POA: Insufficient documentation

## 2013-10-13 DIAGNOSIS — E785 Hyperlipidemia, unspecified: Secondary | ICD-10-CM | POA: Insufficient documentation

## 2013-10-13 DIAGNOSIS — Z90710 Acquired absence of both cervix and uterus: Secondary | ICD-10-CM | POA: Insufficient documentation

## 2013-10-13 DIAGNOSIS — Z992 Dependence on renal dialysis: Secondary | ICD-10-CM | POA: Insufficient documentation

## 2013-10-13 DIAGNOSIS — Z79899 Other long term (current) drug therapy: Secondary | ICD-10-CM | POA: Insufficient documentation

## 2013-10-13 DIAGNOSIS — E669 Obesity, unspecified: Secondary | ICD-10-CM | POA: Insufficient documentation

## 2013-10-13 MED ORDER — FLUTICASONE PROPIONATE 50 MCG/ACT NA SUSP: 2.0000 | Freq: Every day | NASAL | Status: DC

## 2013-10-13 MED ORDER — HYDROCODONE-HOMATROPINE 5-1.5 MG/5ML PO SYRP: 5.0000 mL | ORAL_SOLUTION | Freq: Four times a day (QID) | ORAL | Status: DC | PRN

## 2013-10-13 NOTE — ED Provider Notes (Signed)
CSN: [id number]     Arrival date & time 10/13/13  1700 History  This chart was scribed for non-physician practitioner, Antony Madura, PA-C working with Hilario Quarry, MD by Greggory Stallion, ED scribe. This patient was seen in room TR11C/TR11C and the patient's care was started at 7:40 PM.   Chief Complaint  Patient presents with  . Cough  . Sore Throat   The history is provided by the patient. No language interpreter was used.   HPI Comments: Jacqueline Petersen is a 57 y.o. female who presents to the Emergency Department complaining of gradual onset, constant sore throat, congested cough, body aches, nasal congestion, rhinorrhea and intermittent headaches that started 2 weeks ago. She states she had nausea a few days ago but it is resolved now. Pt denies fever, abdominal pain, emesis.   Past Medical History  Diagnosis Date  . Hypertension   . A-fib   . History of hysterectomy   . Acid reflux   . Hyperlipidemia   . Chronic kidney failure     pt on dialysis  . Fatigue   . Personal history of long-term (current) use of anticoagulants   . Dyslipidemia   . GERD (gastroesophageal reflux disease)   . Hypokalemia   . Personal history of hypertensive heart disease   . Obesity    Past Surgical History  Procedure Laterality Date  . Partial hysterectomy    . Cardiac catheterization  04/15/2007    Est. EF of 50-55% -- Smooth and normal coronary arteries -- Normal left ventricular systolic function.  We will continue with further treatment of her atrial fibrillation     . Abdominal hysterectomy     Family History  Problem Relation Age of Onset  . Coronary artery disease    . Hypertension     History  Substance Use Topics  . Smoking status: Never Smoker   . Smokeless tobacco: Not on file  . Alcohol Use: No   OB History   Grav Para Term Preterm Abortions TAB SAB Ect Mult Living                 Review of Systems  Constitutional: Positive for fatigue. Negative for fever.  HENT:  Positive for congestion, rhinorrhea and sore throat.   Respiratory: Positive for cough.   Gastrointestinal: Negative for nausea, vomiting and abdominal pain.  Neurological: Positive for headaches.  All other systems reviewed and are negative.    Allergies  Lipitor  Home Medications   Current Outpatient Rx  Name  Route  Sig  Dispense  Refill  . aspirin EC 81 MG tablet   Oral   Take 81 mg by mouth daily.           . B Complex-C (B-COMPLEX WITH VITAMIN C) tablet   Oral   Take 1 tablet by mouth daily.         . cholecalciferol (VITAMIN D) 1000 UNITS tablet   Oral   Take 2,000 Units by mouth daily.         . Cinnamon 500 MG capsule   Oral   Take 1,000 mg by mouth daily.         . metoprolol succinate (TOPROL-XL) 100 MG 24 hr tablet   Oral   Take 100 mg by mouth at bedtime. Take with or immediately following a meal.         . Multiple Vitamins-Calcium (ONE-A-DAY WOMENS FORMULA PO)   Oral   Take 1 tablet by mouth daily.         Marland Kitchen  olmesartan (BENICAR) 20 MG tablet   Oral   Take 20 mg by mouth at bedtime.         Marland Kitchen omeprazole (PRILOSEC OTC) 20 MG tablet   Oral   Take 20 mg by mouth daily.         . rosuvastatin (CRESTOR) 10 MG tablet   Oral   Take 10 mg by mouth at bedtime.          . fluticasone (FLONASE) 50 MCG/ACT nasal spray   Nasal   Place 2 sprays into the nose daily. For nasal congestion   16 g   2   . HYDROcodone-homatropine (HYCODAN) 5-1.5 MG/5ML syrup   Oral   Take 5 mLs by mouth every 6 (six) hours as needed for cough.   120 mL   0    BP 159/79  Pulse 79  Temp(Src) 98 F (36.7 C) (Oral)  Resp 18  Wt 255 lb 11.2 oz (115.985 kg)  BMI 42.55 kg/m2  SpO2 97%  Physical Exam  Nursing note and vitals reviewed. Constitutional: She is oriented to person, place, and time. She appears well-developed and well-nourished. No distress.  HENT:  Head: Normocephalic and atraumatic.  Right Ear: Tympanic membrane, external ear and ear canal  normal. No mastoid tenderness.  Left Ear: Tympanic membrane, external ear and ear canal normal. No mastoid tenderness.  Mouth/Throat: Uvula is midline and mucous membranes are normal. Posterior oropharyngeal erythema (mild) present. No tonsillar abscesses.  Mild tonsillar enlargement bilaterally. Uvula midline. Mild erythema. No edema. Airway patent.  Eyes: Conjunctivae and EOM are normal. No scleral icterus.  Neck: Normal range of motion.  Cardiovascular: Normal rate, regular rhythm and normal heart sounds.   No murmur heard. Pulmonary/Chest: Effort normal and breath sounds normal. No respiratory distress. She has no wheezes. She has no rales.  Musculoskeletal: Normal range of motion.  Neurological: She is alert and oriented to person, place, and time.  Skin: Skin is warm and dry. No rash noted. She is not diaphoretic. No erythema. No pallor.  Psychiatric: She has a normal mood and affect. Her behavior is normal.    ED Course  Procedures (including critical care time)  DIAGNOSTIC STUDIES: Oxygen Saturation is 97% on RA, normal by my interpretation.    COORDINATION OF CARE: 7:45 PM-Discussed treatment plan which includes symptomatic treatment with pt at bedside and pt agreed to plan.    Labs Review Labs Reviewed  RAPID STREP SCREEN  CULTURE, GROUP A STREP   Imaging Review Dg Chest 2 View  10/13/2013   CLINICAL DATA:  Sore throat  EXAM: CHEST  2 VIEW  COMPARISON:  06/22/2012  FINDINGS: The heart size and mediastinal contours are within normal limits. Both lungs are clear. The visualized skeletal structures are unremarkable.  IMPRESSION: No active cardiopulmonary disease.   Electronically Signed   By: Christiana Pellant M.D.   On: 10/13/2013 19:34    EKG Interpretation   None       MDM   1. Viral URI with cough    57 y/o female with uncomplicated viral URI with cough. She is well and nontoxic appearing, hemodynamically stable, and afebrile. Uvula midline without evidence of  PTA. Patient tolerating secretions without difficulty. Heart RRR, lungs CTAB with symmetric chest expansion. Rapid strep negative and DG chest without evidence of PNA or bronchitis. No other acute cardiopulmonary changes noted. She is stable and appropriate for d/c with PCP follow up. Rx for Flonase and hycodan given for symptoms. Return precautions  discussed and patient agreeable to plan with no unaddressed concerns.   Filed Vitals:   10/13/13 1702 10/13/13 2012  BP: 159/79 130/83  Pulse: 79 67  Temp: 98 F (36.7 C) 99.1 F (37.3 C)  TempSrc: Oral Oral  Resp: 18   Weight: 255 lb 11.2 oz (115.985 kg)   SpO2: 97% 100%     I personally performed the services described in this documentation, which was scribed in my presence. The recorded information has been reviewed and is accurate.    Antony Madura, PA-C 10/18/13 2009

## 2013-10-13 NOTE — ED Notes (Signed)
Pt c/o sore throat and productive cough with green sputum; pt sts x 2 week and body aches

## 2013-10-22 NOTE — ED Provider Notes (Signed)
History/physical exam/procedure(s) were performed by non-physician practitioner and as supervising physician I was immediately available for consultation/collaboration. I have reviewed all notes and am in agreement with care and plan.   Hilario Quarry, MD 10/22/13 [phone]

## 2013-12-03 ENCOUNTER — Other Ambulatory Visit: Payer: Self-pay

## 2013-12-03 ENCOUNTER — Inpatient Hospital Stay (HOSPITAL_COMMUNITY)
Admission: EM | Admit: 2013-12-03 | Discharge: 2013-12-06 | DRG: 310 | Disposition: A | Discharge status: Home / Self Care | Payer: No Typology Code available for payment source | Attending: Cardiology | Admitting: Cardiology

## 2013-12-03 ENCOUNTER — Emergency Department (HOSPITAL_COMMUNITY): Payer: No Typology Code available for payment source

## 2013-12-03 ENCOUNTER — Encounter (HOSPITAL_COMMUNITY): Payer: Self-pay | Admitting: Emergency Medicine

## 2013-12-03 DIAGNOSIS — E785 Hyperlipidemia, unspecified: Secondary | ICD-10-CM

## 2013-12-03 DIAGNOSIS — I4891 Unspecified atrial fibrillation: Secondary | ICD-10-CM

## 2013-12-03 DIAGNOSIS — Z889 Allergy status to unspecified drugs, medicaments and biological substances status: Secondary | ICD-10-CM

## 2013-12-03 DIAGNOSIS — I10 Essential (primary) hypertension: Secondary | ICD-10-CM

## 2013-12-03 DIAGNOSIS — Z8249 Family history of ischemic heart disease and other diseases of the circulatory system: Secondary | ICD-10-CM

## 2013-12-03 DIAGNOSIS — Z79899 Other long term (current) drug therapy: Secondary | ICD-10-CM

## 2013-12-03 DIAGNOSIS — Z7901 Long term (current) use of anticoagulants: Secondary | ICD-10-CM

## 2013-12-03 DIAGNOSIS — Z7982 Long term (current) use of aspirin: Secondary | ICD-10-CM

## 2013-12-03 DIAGNOSIS — Z6836 Body mass index (BMI) 36.0-36.9, adult: Secondary | ICD-10-CM

## 2013-12-03 DIAGNOSIS — K219 Gastro-esophageal reflux disease without esophagitis: Secondary | ICD-10-CM

## 2013-12-03 DIAGNOSIS — I119 Hypertensive heart disease without heart failure: Secondary | ICD-10-CM | POA: Diagnosis present

## 2013-12-03 DIAGNOSIS — I4819 Other persistent atrial fibrillation: Secondary | ICD-10-CM | POA: Diagnosis present

## 2013-12-03 LAB — CBC WITH DIFFERENTIAL/PLATELET
Basophils Absolute: 0 10*3/uL (ref 0.0–0.1)
Eosinophils Absolute: 0.3 10*3/uL (ref 0.0–0.7)
Eosinophils Relative: 2 % (ref 0–5)
MCH: 28.8 pg (ref 26.0–34.0)
MCHC: 33.3 g/dL (ref 30.0–36.0)
Monocytes Absolute: 0.7 10*3/uL (ref 0.1–1.0)
Neutrophils Relative %: 34 % — ABNORMAL LOW (ref 43–77)
Platelets: 343 10*3/uL (ref 150–400)
RBC: 5.11 MIL/uL (ref 3.87–5.11)
WBC: 13.4 10*3/uL — ABNORMAL HIGH (ref 4.0–10.5)

## 2013-12-03 LAB — POCT I-STAT TROPONIN I: Troponin i, poc: 0.03 ng/mL (ref 0.00–0.08)

## 2013-12-03 LAB — BASIC METABOLIC PANEL
CO2: 27 mEq/L (ref 19–32)
Calcium: 9.2 mg/dL (ref 8.4–10.5)
Creatinine, Ser: 0.71 mg/dL (ref 0.50–1.10)
GFR calc Af Amer: >90 mL/min (ref >90)
GFR calc non Af Amer: >90 mL/min (ref >90)
Sodium: 140 mEq/L (ref 135–145)

## 2013-12-03 MED ORDER — SIMVASTATIN 40 MG PO TABS
40.0000 mg | ORAL_TABLET | Freq: Every day | ORAL | Status: DC
  Administered 2013-12-04: 40 mg via ORAL
  Filled 2013-12-03 (×2): qty 1

## 2013-12-03 MED ORDER — ACETAMINOPHEN 325 MG PO TABS: 650.0000 mg | ORAL_TABLET | ORAL | Status: DC | PRN

## 2013-12-03 MED ORDER — HEPARIN (PORCINE) IN NACL 100-0.45 UNIT/ML-% IJ SOLN
1450.0000 [IU]/h | INTRAMUSCULAR | Status: DC
  Administered 2013-12-04: 1450 [IU]/h via INTRAVENOUS
  Administered 2013-12-04: 1500 [IU]/h via INTRAVENOUS
  Administered 2013-12-05: 1450 [IU]/h via INTRAVENOUS
  Filled 2013-12-03 (×6): qty 250

## 2013-12-03 MED ORDER — OFF THE BEAT BOOK
Freq: Once | Status: AC
  Administered 2013-12-04: 01:00:00
  Filled 2013-12-03: qty 1

## 2013-12-03 MED ORDER — ONDANSETRON HCL 4 MG/2ML IJ SOLN: 4.0000 mg | Freq: Four times a day (QID) | INTRAMUSCULAR | Status: DC | PRN

## 2013-12-03 MED ORDER — METOPROLOL TARTRATE 1 MG/ML IV SOLN
5.0000 mg | Freq: Once | INTRAVENOUS | Status: AC
  Administered 2013-12-03: 5 mg via INTRAVENOUS
  Filled 2013-12-03: qty 5

## 2013-12-03 MED ORDER — ASPIRIN EC 325 MG PO TBEC
325.0000 mg | DELAYED_RELEASE_TABLET | Freq: Once | ORAL | Status: AC
  Administered 2013-12-03: 325 mg via ORAL
  Filled 2013-12-03: qty 1

## 2013-12-03 MED ORDER — PANTOPRAZOLE SODIUM 40 MG PO TBEC
40.0000 mg | DELAYED_RELEASE_TABLET | Freq: Every day | ORAL | Status: DC
  Administered 2013-12-04 – 2013-12-06 (×3): 40 mg via ORAL
  Filled 2013-12-03 (×3): qty 1

## 2013-12-03 MED ORDER — DILTIAZEM HCL 100 MG IV SOLR
5.0000 mg/h | Freq: Once | INTRAVENOUS | Status: AC
  Administered 2013-12-03: 5 mg/h via INTRAVENOUS

## 2013-12-03 MED ORDER — OMEPRAZOLE MAGNESIUM 20 MG PO TBEC: 20.0000 mg | DELAYED_RELEASE_TABLET | Freq: Every day | ORAL | Status: DC

## 2013-12-03 MED ORDER — HEPARIN BOLUS VIA INFUSION
4000.0000 [IU] | Freq: Once | INTRAVENOUS | Status: AC
  Administered 2013-12-04: 4000 [IU] via INTRAVENOUS
  Filled 2013-12-03: qty 4000

## 2013-12-03 NOTE — H&P (Addendum)
Cardiology History and Physical  Provider Not In System  History of Present Illness (and review of medical records): Jacqueline Petersen is a 57 y.o. female who presents for evaluation of palpitations.  She has hx of Paroxysmal atrial fibrillation previously on coumadin, HTN, HLD, GERD, and obesity.  She stated she last had problems with Afib approx 3-4 years prior.  She is BB and baby ASA.  She was in usual state of health until today around 11am while eating lunch when she noticed the onset of a palpitations.  This continued on throughout the day until 5pm.  She denied any chest pain, shortness of breath, presyncope or syncope.  She pressented to ED as palpitations did not go away.  She reports occasional palpitations but usually go away after short periods.  She was found to be in Atrial Fibrillation with RVR Hr in 160s.  She was given ASA, IV metoprolol and placed on Dilt gtt in ED.  Previous diagnostic testing:  Echo 04/14/2007 LEFT VENTRICLE: - Overall left ventricular systolic function was at the lower limits of normal. - Left ventricular wall thickness was mildly increased. AORTIC VALVE: Doppler interpretation(s): - There was no evidence for aortic valve stenosis. - There was no significant aortic valvular regurgitation. MITRAL VALVE: Doppler interpretation(s): - There was no evidence for mitral stenosis. - There was mild mitral valvular regurgitation. LEFT ATRIUM: - The left atrium was mildly dilated. TRICUSPID VALVE: Doppler interpretation(s): - There was trivial tricuspid valvular regurgitation. PERICARDIUM: - There was no pericardial effusion.  Review of Systems Recent upper respiratory infection.  No current fevers, chills. Further review of systems was otherwise negative other than stated in HPI.  Patient Active Problem List   Diagnosis Date Noted  . Atrial fibrillation with RVR 12/03/2013  . HYPERLIPIDEMIA 06/12/2007  . HYPERTENSION 06/12/2007  . ATRIAL  FIBRILLATION 06/12/2007  . GERD 06/12/2007   Past Medical History  Diagnosis Date  . Hypertension   . A-fib   . History of hysterectomy   . Acid reflux   . Hyperlipidemia   . Fatigue   . Personal history of long-term (current) use of anticoagulants   . Dyslipidemia   . GERD (gastroesophageal reflux disease)   . Hypokalemia   . Personal history of hypertensive heart disease   . Obesity     Past Surgical History  Procedure Laterality Date  . Partial hysterectomy    . Cardiac catheterization  04/15/2007    Est. EF of 50-55% -- Smooth and normal coronary arteries -- Normal left ventricular systolic function.  We will continue with further treatment of her atrial fibrillation     . Abdominal hysterectomy      Prescriptions prior to admission  Medication Sig Dispense Refill  . aspirin EC 81 MG tablet Take 81 mg by mouth daily.        . B Complex-C (B-COMPLEX WITH VITAMIN C) tablet Take 1 tablet by mouth daily.      . cholecalciferol (VITAMIN D) 1000 UNITS tablet Take 1,000 Units by mouth daily.       . Cinnamon 500 MG capsule Take 1,000 mg by mouth daily.      . metoprolol succinate (TOPROL-XL) 100 MG 24 hr tablet Take 100 mg by mouth at bedtime. Take with or immediately following a meal.      . Multiple Vitamins-Calcium (ONE-A-DAY WOMENS FORMULA PO) Take 1 tablet by mouth daily.      Marland Kitchen olmesartan (BENICAR) 20 MG tablet Take 20 mg by mouth at  bedtime.      Marland Kitchen omeprazole (PRILOSEC OTC) 20 MG tablet Take 20 mg by mouth daily.      . rosuvastatin (CRESTOR) 10 MG tablet Take 10 mg by mouth at bedtime.        Allergies  Allergen Reactions  . Lipitor [Atorvastatin Calcium] Swelling    History  Substance Use Topics  . Smoking status: Never Smoker   . Smokeless tobacco: Not on file  . Alcohol Use: No    Family History  Problem Relation Age of Onset  . Coronary artery disease    . Hypertension       Objective:  Patient Vitals for the past 8 hrs:  BP Temp Temp src Pulse Resp  SpO2 Height Weight  12/03/13 2330 146/82 mmHg 98.2 F (36.8 C) Oral 73 18 96 % 5\' 9"  (1.753 m) 111.766 kg (246 lb 6.4 oz)  12/03/13 2300 134/73 mmHg - - 70 17 95 % - -  12/03/13 2245 137/70 mmHg - - 128 16 92 % - -  12/03/13 2230 119/97 mmHg - - 106 23 99 % - -   General appearance: alert, cooperative, appears stated age and no distress, obese Head: Normocephalic, without obvious abnormality, atraumatic Eyes: conjunctivae/corneas clear. PERRL, EOM's intact. Fundi benign. Neck: no carotid bruit, no JVD and supple, symmetrical, trachea midline Lungs: clear to auscultation bilaterally Chest wall: no tenderness Heart: irregular rate and rhythm, S1, S2 normal, Abdomen: soft, non-tender; bowel sounds normal; no masses,  no organomegaly Extremities: extremities normal, atraumatic, no cyanosis or edema Pulses: 2+ and symmetric Neurologic: Grossly normal  Results for orders placed during the hospital encounter of 12/03/13 (from the past 48 hour(s))  CBC WITH DIFFERENTIAL     Status: Abnormal   Collection Time    12/03/13  7:24 PM      Result Value Range   WBC 13.4 (*) 4.0 - 10.5 K/uL   RBC 5.11  3.87 - 5.11 MIL/uL   Hemoglobin 14.7  12.0 - 15.0 g/dL   HCT 04.5  40.9 - 81.1 %   MCV 86.3  78.0 - 100.0 fL   MCH 28.8  26.0 - 34.0 pg   MCHC 33.3  30.0 - 36.0 g/dL   RDW 91.4  78.2 - 95.6 %   Platelets 343  150 - 400 K/uL   Neutrophils Relative % 34 (*) 43 - 77 %   Lymphocytes Relative 59 (*) 12 - 46 %   Monocytes Relative 5  3 - 12 %   Eosinophils Relative 2  0 - 5 %   Basophils Relative 0  0 - 1 %   Neutro Abs 4.6  1.7 - 7.7 K/uL   Lymphs Abs 7.8 (*) 0.7 - 4.0 K/uL   Monocytes Absolute 0.7  0.1 - 1.0 K/uL   Eosinophils Absolute 0.3  0.0 - 0.7 K/uL   Basophils Absolute 0.0  0.0 - 0.1 K/uL   WBC Morphology ABSOLUTE LYMPHOCYTOSIS    BASIC METABOLIC PANEL     Status: Abnormal   Collection Time    12/03/13  7:24 PM      Result Value Range   Sodium 140  135 - 145 mEq/L   Potassium 4.0   3.5 - 5.1 mEq/L   Chloride 103  96 - 112 mEq/L   CO2 27  19 - 32 mEq/L   Glucose, Bld 126 (*) 70 - 99 mg/dL   BUN 13  6 - 23 mg/dL   Creatinine, Ser 2.13  0.50 -  1.10 mg/dL   Calcium 9.2  8.4 - 16.1 mg/dL   GFR calc non Af Amer >90  >90 mL/min   GFR calc Af Amer >90  >90 mL/min   Comment: (NOTE)     The eGFR has been calculated using the CKD EPI equation.     This calculation has not been validated in all clinical situations.     eGFR's persistently <90 mL/min signify possible Chronic Kidney     Disease.  POCT I-STAT TROPONIN I     Status: None   Collection Time    12/03/13  7:39 PM      Result Value Range   Troponin i, poc 0.03  0.00 - 0.08 ng/mL   Comment 3            Comment: Due to the release kinetics of cTnI,     a negative result within the first hours     of the onset of symptoms does not rule out     myocardial infarction with certainty.     If myocardial infarction is still suspected,     repeat the test at appropriate intervals.   Dg Chest Portable 1 View  12/03/2013   CLINICAL DATA:  Atrial fibrillation  EXAM: PORTABLE CHEST - 1 VIEW  COMPARISON:  October 13, 2013  FINDINGS: Lungs are clear. Heart size is upper normal with normal pulmonary vascularity. No adenopathy. No bone lesions.  IMPRESSION: Heart upper normal in size.  No edema or consolidation.   Electronically Signed   By: Bretta Bang M.D.   On: 12/03/2013 19:33    ECG:  Atrial fibrillation with RVR HR 153, previous NSR, LAD  Assessment: Atrial Fibrillation with RVR HTN HLD Morbid Obesity  Plan: 1. Cardiology Admission  2. Continuous monitoring on Telemetry. 3. Repeat ekg on admit, prn chest pain or arrythmia 4. Hgba1c, tsh 5. Medical management to include IV Dilt and IV Heparin gtt. 6. Keep NPO for TEE/DCCV if still in Afib.

## 2013-12-03 NOTE — ED Provider Notes (Signed)
CSN: [id number]     Arrival date & time 12/03/13  1843 History   First MD Initiated Contact with Patient 12/03/13 1857     Chief Complaint  Patient presents with  . Atrial Fibrillation  . Chest Pain   (Consider location/radiation/quality/duration/timing/severity/associated sxs/prior Treatment) HPI Chief complaint palpitations  Patient is a 57 year old female past medical history significant for paroxysmal A. Fib and ESRD who comes in with chief complaint of palpitations. Patient states she has palpitations almost daily. However these are usually self-limited. She began having palpitations today at approximately noon. However these did not go away as usual. She's been having palpitations since then. They have been constant and unchanged. She denies any chest pain. She endorses only feeling of "heart thumping". Patient's had no treatment other than her home dose of metoprolol this morning. He denies any chest pain, shortness of breath or other concerning symptoms. Triage note states patient has chest pressure 7/10. However on my exam patient states her some pressure this as a feeling of palpitations only.  Past Medical History  Diagnosis Date  . Hypertension   . A-fib   . History of hysterectomy   . Acid reflux   . Hyperlipidemia   . Chronic kidney failure     pt on dialysis  . Fatigue   . Personal history of long-term (current) use of anticoagulants   . Dyslipidemia   . GERD (gastroesophageal reflux disease)   . Hypokalemia   . Personal history of hypertensive heart disease   . Obesity    Past Surgical History  Procedure Laterality Date  . Partial hysterectomy    . Cardiac catheterization  04/15/2007    Est. EF of 50-55% -- Smooth and normal coronary arteries -- Normal left ventricular systolic function.  We will continue with further treatment of her atrial fibrillation     . Abdominal hysterectomy     Family History  Problem Relation Age of Onset  . Coronary artery disease     . Hypertension     History  Substance Use Topics  . Smoking status: Never Smoker   . Smokeless tobacco: Not on file  . Alcohol Use: No   OB History   Grav Para Term Preterm Abortions TAB SAB Ect Mult Living                 Review of Systems  Constitutional: Negative for fatigue.  Respiratory: Negative for shortness of breath.   Cardiovascular: Positive for palpitations. Negative for chest pain.  Gastrointestinal: Negative for abdominal pain.  Genitourinary: Negative for dysuria.  Musculoskeletal: Negative for back pain.  Skin: Negative for rash.  Neurological: Negative for headaches.  Psychiatric/Behavioral: Negative for agitation.  All other systems reviewed and are negative.    Allergies  Lipitor  Home Medications   No current outpatient prescriptions on file. BP 146/82  Pulse 73  Temp(Src) 98.2 F (36.8 C) (Oral)  Resp 18  Ht 5\' 9"  (1.753 m)  Wt 246 lb 6.4 oz (111.766 kg)  BMI 36.37 kg/m2  SpO2 96% Physical Exam  Nursing note and vitals reviewed. Constitutional: She is oriented to person, place, and time. She appears well-developed and well-nourished.  HENT:  Head: Normocephalic and atraumatic.  Eyes: EOM are normal. Pupils are equal, round, and reactive to light.  Neck: Normal range of motion.  Cardiovascular: Intact distal pulses.   afib RVR   Pulmonary/Chest: Effort normal and breath sounds normal. No respiratory distress. She exhibits no tenderness.  Abdominal: Soft. She exhibits no  distension. There is no tenderness. There is no rebound and no guarding.  Musculoskeletal: Normal range of motion.  Neurological: She is alert and oriented to person, place, and time. No cranial nerve deficit. She exhibits normal muscle tone. Coordination normal.  Skin: Skin is warm and dry.  Psychiatric: She has a normal mood and affect. Her behavior is normal. Judgment and thought content normal.    ED Course  Procedures (including critical care time) Labs  Review Labs Reviewed  CBC WITH DIFFERENTIAL - Abnormal; Notable for the following:    WBC 13.4 (*)    Neutrophils Relative % 34 (*)    Lymphocytes Relative 59 (*)    Lymphs Abs 7.8 (*)    All other components within normal limits  BASIC METABOLIC PANEL - Abnormal; Notable for the following:    Glucose, Bld 126 (*)    All other components within normal limits  TSH  T4, FREE  MAGNESIUM  HEMOGLOBIN A1C  CBC  BASIC METABOLIC PANEL  HEPARIN LEVEL (UNFRACTIONATED)  CBC  PROTIME-INR  POCT I-STAT TROPONIN I   Imaging Review Dg Chest Portable 1 View  12/03/2013   CLINICAL DATA:  Atrial fibrillation  EXAM: PORTABLE CHEST - 1 VIEW  COMPARISON:  October 13, 2013  FINDINGS: Lungs are clear. Heart size is upper normal with normal pulmonary vascularity. No adenopathy. No bone lesions.  IMPRESSION: Heart upper normal in size.  No edema or consolidation.   Electronically Signed   By: Bretta Bang M.D.   On: 12/03/2013 19:33    EKG Interpretation    Date/Time:    Ventricular Rate:    PR Interval:    QRS Duration:   QT Interval:    QTC Calculation:   R Axis:     Text Interpretation:              MDM   1. Atrial fibrillation with RVR     57 year old female history of paroxysmal A. fib. States approximately noon had onset of palpitations. Has daily palpitations however these are usually self-limited. On arrival patient states she's been present for approximately 9 hours. EKG showed patient A. fib RVR. Was given metoprolol 5 mg x2. Unable to rate control her convert. Patient started on 5 mg diltiazem drip. Patient rate controlled in the 90s and low 100s. Patient did say she had pressure-type pain with palpitations. Which is normal for her. When the rate slowed patient no longer experiencing pain. EKG showed no signs of overt ischemia initial troponin negative. Cardiology was consult for admission secondary to A. fib RVR with need for diltiazem drip for control. Patient was admitted  no further acute issues   Bridgett Larsson, MD 12/03/13 [phone]

## 2013-12-03 NOTE — ED Notes (Signed)
Pt presents to department for evaluation of chest pressure and heart racing. States she began feeling her heart skipping beats this afternoon, states 7/10 pressure at the time. Also states SOB and fatigue. Pt is alert and oriented x4.

## 2013-12-03 NOTE — ED Notes (Signed)
Pt given phone to call family.

## 2013-12-03 NOTE — Progress Notes (Signed)
ANTICOAGULATION CONSULT NOTE - Initial Consult  Pharmacy Consult for heparin Indication: atrial fibrillation  Allergies  Allergen Reactions  . Lipitor [Atorvastatin Calcium] Swelling    Patient Measurements: Height: 5\' 9"  (175.3 cm) Weight: 246 lb 6.4 oz (111.766 kg) IBW/kg (Calculated) : 66.2 Heparin Dosing Weight: 95kg  Vital Signs: Temp: 98.2 F (36.8 C) (12/10 2330) Temp src: Oral (12/10 2330) BP: 146/82 mmHg (12/10 2330) Pulse Rate: 73 (12/10 2330)  Labs:  Recent Labs  12/03/13 1924  HGB 14.7  HCT 44.1  PLT 343  CREATININE 0.71    Estimated Creatinine Clearance: 105.9 ml/min (by C-G formula based on Cr of 0.71).   Medical History: Past Medical History  Diagnosis Date  . Hypertension   . A-fib   . History of hysterectomy   . Acid reflux   . Hyperlipidemia   . Chronic kidney failure     pt on dialysis  . Fatigue   . Personal history of long-term (current) use of anticoagulants   . Dyslipidemia   . GERD (gastroesophageal reflux disease)   . Hypokalemia   . Personal history of hypertensive heart disease   . Obesity     Medications:  Prescriptions prior to admission  Medication Sig Dispense Refill  . aspirin EC 81 MG tablet Take 81 mg by mouth daily.        . B Complex-C (B-COMPLEX WITH VITAMIN C) tablet Take 1 tablet by mouth daily.      . cholecalciferol (VITAMIN D) 1000 UNITS tablet Take 1,000 Units by mouth daily.       . Cinnamon 500 MG capsule Take 1,000 mg by mouth daily.      . metoprolol succinate (TOPROL-XL) 100 MG 24 hr tablet Take 100 mg by mouth at bedtime. Take with or immediately following a meal.      . Multiple Vitamins-Calcium (ONE-A-DAY WOMENS FORMULA PO) Take 1 tablet by mouth daily.      Marland Kitchen olmesartan (BENICAR) 20 MG tablet Take 20 mg by mouth at bedtime.      Marland Kitchen omeprazole (PRILOSEC OTC) 20 MG tablet Take 20 mg by mouth daily.      . rosuvastatin (CRESTOR) 10 MG tablet Take 10 mg by mouth at bedtime.        Scheduled:  . off  the beat book   Does not apply Once  . [START ON 12/04/2013] pantoprazole  40 mg Oral Daily  . [START ON 12/04/2013] simvastatin  40 mg Oral q1800    Assessment: 57yo female c/o chest pressure and palpitations w/ "heart skipping beats", last had problems w/ Afib 3-4 years ago, previously anticoagulated on Coumadin (CHADS2=1) though now no anticoagulant, now in Afib w/ RVR to 160s, to begin heparin.  Goal of Therapy:  Heparin level 0.3-0.7 units/ml Monitor platelets by anticoagulation protocol: Yes   Plan:  Will give heparin 4000 units IV bolus x1 followed by gtt at 1500 units/hr and monitor heparin levels and CBC.  Vernard Gambles, PharmD, BCPS  12/03/2013,11:40 PM

## 2013-12-03 NOTE — ED Notes (Signed)
X-ray at bedside

## 2013-12-03 NOTE — ED Notes (Signed)
cardiologist at bedside

## 2013-12-03 NOTE — ED Notes (Signed)
MD aware that pt reports he chest pressure is gone and she no longer feels a thumping all over her body.

## 2013-12-04 DIAGNOSIS — I4891 Unspecified atrial fibrillation: Principal | ICD-10-CM

## 2013-12-04 DIAGNOSIS — I517 Cardiomegaly: Secondary | ICD-10-CM

## 2013-12-04 DIAGNOSIS — I10 Essential (primary) hypertension: Secondary | ICD-10-CM

## 2013-12-04 LAB — CBC
HCT: 42.5 % (ref 36.0–46.0)
Hemoglobin: 14 g/dL (ref 12.0–15.0)
MCH: 28.6 pg (ref 26.0–34.0)
MCH: 28.8 pg (ref 26.0–34.0)
MCHC: 32.9 g/dL (ref 30.0–36.0)
MCHC: 33.3 g/dL (ref 30.0–36.0)
MCV: 86.9 fL (ref 78.0–100.0)
MCV: 86.5 fL (ref 78.0–100.0)
Platelets: 304 10*3/uL (ref 150–400)
RBC: 4.96 MIL/uL (ref 3.87–5.11)
RDW: 15.1 % (ref 11.5–15.5)
RDW: 15.1 % (ref 11.5–15.5)
WBC: 9.6 10*3/uL (ref 4.0–10.5)
WBC: 9.4 10*3/uL (ref 4.0–10.5)

## 2013-12-04 LAB — BASIC METABOLIC PANEL
BUN: 14 mg/dL (ref 6–23)
Calcium: 9 mg/dL (ref 8.4–10.5)
Creatinine, Ser: 0.72 mg/dL (ref 0.50–1.10)
GFR calc non Af Amer: >90 mL/min (ref >90)
Glucose, Bld: 120 mg/dL — ABNORMAL HIGH (ref 70–99)
Sodium: 140 mEq/L (ref 135–145)

## 2013-12-04 LAB — HEPARIN LEVEL (UNFRACTIONATED): Heparin Unfractionated: 0.41 IU/mL (ref 0.30–0.70)

## 2013-12-04 LAB — PROTIME-INR: Prothrombin Time: 13.8 seconds (ref 11.6–15.2)

## 2013-12-04 MED ORDER — WARFARIN - PHARMACIST DOSING INPATIENT: Freq: Every day | Status: DC

## 2013-12-04 MED ORDER — WARFARIN SODIUM 7.5 MG PO TABS
7.5000 mg | ORAL_TABLET | Freq: Once | ORAL | Status: AC
  Administered 2013-12-04: 7.5 mg via ORAL
  Filled 2013-12-04: qty 1

## 2013-12-04 MED ORDER — OFF THE BEAT BOOK
Freq: Once | Status: AC
  Administered 2013-12-04: 16:00:00
  Filled 2013-12-04: qty 1

## 2013-12-04 MED ORDER — PERFLUTREN LIPID MICROSPHERE
1.0000 mL | INTRAVENOUS | Status: AC | PRN
  Administered 2013-12-04: 2 mL via INTRAVENOUS
  Filled 2013-12-04: qty 10

## 2013-12-04 MED ORDER — DILTIAZEM HCL 100 MG IV SOLR
5.0000 mg/h | INTRAVENOUS | Status: DC
  Administered 2013-12-04: 10 mg/h via INTRAVENOUS
  Administered 2013-12-05: 5 mg/h via INTRAVENOUS
  Filled 2013-12-04 (×2): qty 100

## 2013-12-04 MED ORDER — ALUM & MAG HYDROXIDE-SIMETH 200-200-20 MG/5ML PO SUSP
30.0000 mL | Freq: Four times a day (QID) | ORAL | Status: DC | PRN
  Administered 2013-12-05: 30 mL via ORAL
  Filled 2013-12-04 (×2): qty 30

## 2013-12-04 MED ORDER — METOPROLOL SUCCINATE ER 100 MG PO TB24
100.0000 mg | ORAL_TABLET | Freq: Every day | ORAL | Status: DC
  Administered 2013-12-04 – 2013-12-06 (×3): 100 mg via ORAL
  Filled 2013-12-04 (×3): qty 1

## 2013-12-04 NOTE — Progress Notes (Signed)
Echocardiogram 2D Echocardiogram with Definity has been performed.  Jacqueline Petersen 12/04/2013, 12:40 PM

## 2013-12-04 NOTE — Progress Notes (Signed)
UR Completed Finley Chevez Graves-Bigelow, RN,BSN [phone]  

## 2013-12-04 NOTE — Care Management Note (Addendum)
    Page 1 of 1   12/06/2013     9:25:47 AM   CARE MANAGEMENT NOTE 12/06/2013  Patient:  Jacqueline Petersen, Jacqueline Petersen   Account Number:  [id number]  Date Initiated:  12/04/2013  Documentation initiated by:  GRAVES-BIGELOW,Caroleen Stoermer  Subjective/Objective Assessment:   Pt admitted for Atrial Fibrillation with RVR Hr in 160s. She was given ASA, IV metoprolol and placed on Dilt gtt in ED. Pt is from home with husband and both are out of work. Pt has PCP @ [facility].     Action/Plan:   CM did discuss medications. Pt states she has an orange card and [facility] All meds are free except prilosec that is $6.00. She states her son helps her pay for that. No assistance needed at this time.   Anticipated DC Date:  12/06/2013   Anticipated DC Plan:  HOME/SELF CARE  In-house referral  Financial Counselor      DC Planning Services  Medication Assistance  CM consult      Choice offered to / List presented to:             Status of service:  Completed, signed off Medicare Important Message given?   (If response is "NO", the following Medicare IM given date fields will be blank) Date Medicare IM given:   Date Additional Medicare IM given:    Discharge Disposition:  HOME/SELF CARE  Per UR Regulation:  Reviewed for med. necessity/level of care/duration of stay  If discussed at Long Length of Stay Meetings, dates discussed:    Comments:  0917 Tomi Bamberger, RN,BSN [phone] CM did speak to pt in ref to disability that CM will have the Financial Counselor contact her before d/c. CM did make pt aware to contact Social Services once d/c andthey can help with process. No further needs from CM at this time.

## 2013-12-04 NOTE — Progress Notes (Signed)
ANTICOAGULATION CONSULT NOTE - Follow up Consult  Pharmacy Consult for heparin, coumadin  Indication: atrial fibrillation  Allergies  Allergen Reactions  . Lipitor [Atorvastatin Calcium] Swelling    Patient Measurements: Height: 5\' 9"  (175.3 cm) Weight: 246 lb 6.4 oz (111.766 kg) IBW/kg (Calculated) : 66.2 Heparin Dosing Weight: 95kg  Vital Signs: Temp: 97.7 F (36.5 C) (12/11 1408) Temp src: Oral (12/11 1408) BP: 130/67 mmHg (12/11 1408) Pulse Rate: 77 (12/11 1408)  Labs:  Recent Labs  12/03/13 1924 12/04/13 0605 12/04/13 0815 12/04/13 1630  HGB 14.7 14.0 14.3  --   HCT 44.1 42.5 42.9  --   PLT 343 309 304  --   LABPROT  --   --  13.8  --   INR  --   --  1.08  --   HEPARINUNFRC  --   --  0.59 0.41  CREATININE 0.71 0.72  --   --     Estimated Creatinine Clearance: 105.9 ml/min (by C-G formula based on Cr of 0.72).   Assessment: 6 hour heparin level is 0.41 on IV heparin drip 1450 units/hr in this 57yo female on IV heparin and coumadin for atrial fibrillation.  Heparin level is therapeutic. CBC this AM was stable. No bleeding noted.  Goal of Therapy:  Heparin level 0.3-0.7 units/ml Monitor platelets by anticoagulation protocol: Yes   Plan:  Continue heparin drip at 1450 units/hr Daily CBC, and daily HL Monitor for s/s of bleeding   Noah Delaine, RPh Clinical Pharmacist Pager: [phone] 12/04/2013, 6:00 PM

## 2013-12-04 NOTE — Progress Notes (Signed)
PROGRESS NOTE  Subjective:   Ms. Jacqueline Petersen is a 57 yo with hx of Atrial fib in 2008.  She had a normal cardiac cath at that point.  She has not seen me since that time.   Hx of HTN, obesity, hyperlipidemia, GERD.    Objective:    Vital Signs:   Temp:  [97.9 F (36.6 C)-98.2 F (36.8 C)] 97.9 F (36.6 C) (12/11 1610) Pulse Rate:  [33-166] 83 (12/11 0632) Resp:  [14-23] 18 (12/11 9604) BP: (104-146)/(60-97) 120/64 mmHg (12/11 0632) SpO2:  [92 %-100 %] 98 % (12/11 5409) Weight:  [246 lb 6.4 oz (111.766 kg)] 246 lb 6.4 oz (111.766 kg) (12/10 2330)  Last BM Date: 12/03/13   24-hour weight change: Weight change:   Weight trends: Filed Weights   12/03/13 2330  Weight: 246 lb 6.4 oz (111.766 kg)    Intake/Output:        Physical Exam: BP 120/64  Pulse 83  Temp(Src) 97.9 F (36.6 C) (Oral)  Resp 18  Ht 5\' 9"  (1.753 m)  Wt 246 lb 6.4 oz (111.766 kg)  BMI 36.37 kg/m2  SpO2 98%  General: Vital signs reviewed and noted.   Head: Normocephalic, atraumatic.  Eyes: conjunctivae/corneas clear.  EOM's intact.   Throat: normal  Neck:  thick  Lungs:    clear  Heart:  irreg. Irreg.   Abdomen:  Soft, non-tender, non-distended  , obese  Extremities:  no edema   Neurologic: A&O X3, CN II - XII are grossly intact.   Psych: Normal     Labs: BMET:  Recent Labs  12/03/13 1924 12/04/13 0605  NA 140 140  K 4.0 3.8  CL 103 105  CO2 27 28  GLUCOSE 126* 120*  BUN 13 14  CREATININE 0.71 0.72  CALCIUM 9.2 9.0  MG  --  2.0    Liver function tests: No results found for this basename: AST, ALT, ALKPHOS, BILITOT, PROT, ALBUMIN,  in the last 72 hours No results found for this basename: LIPASE, AMYLASE,  in the last 72 hours  CBC:  Recent Labs  12/03/13 1924 12/04/13 0605 12/04/13 0815  WBC 13.4* 9.6 9.4  NEUTROABS 4.6  --   --   HGB 14.7 14.0 14.3  HCT 44.1 42.5 42.9  MCV 86.3 86.9 86.5  PLT 343 309 304    Cardiac Enzymes: No results found for this  basename: CKTOTAL, CKMB, TROPONINI,  in the last 72 hours  Coagulation Studies:  Recent Labs  12/04/13 0815  LABPROT 13.8  INR 1.08    Other: No components found with this basename: POCBNP,  No results found for this basename: DDIMER,  in the last 72 hours No results found for this basename: HGBA1C,  in the last 72 hours No results found for this basename: CHOL, HDL, LDLCALC, TRIG, CHOLHDL,  in the last 72 hours No results found for this basename: TSH, T4TOTAL, FREET3, T3FREE, THYROIDAB,  in the last 72 hours No results found for this basename: VITAMINB12, FOLATE, FERRITIN, TIBC, IRON, RETICCTPCT,  in the last 72 hours   Other results:  Tele:  Atrial fib with RVR.    Medications:    Infusions: . diltiazem (CARDIZEM) infusion    . heparin 1,500 Units/hr (12/04/13 0128)    Scheduled Medications: . pantoprazole  40 mg Oral Daily  . simvastatin  40 mg Oral q1800    Assessment/ Plan:    1. Atrial fib:  She presents with atrial fib of  unknown duration - she thinks maybe it just started yesterday but says that her HR has been fast for years.  I do not think we can safely cardiovert her.  She needs better rate control.  Will start her home meds including Toprol.  Increase dilt drip.  Will need another echo.  She is on heparin and has been on coumadin in the past.  I think she will need coumadin long term.  Will make sure she has a TSH drawn.   She will need to follow up with [place] on a regular basis.     Disposition:  Length of Stay: 1  Jacqueline Petersen, Montez Hageman., MD, [facility] 12/04/2013, 10:27 AM Office [phone] Pager [phone]

## 2013-12-04 NOTE — Progress Notes (Signed)
Responded to spiritual care consult request. Pt was alert, watching television, and talking on the telephone. Chaplain offered emotional and spiritual support. Patient said she had no emotional or spiritual needs at this time. She was grateful for knowledge of chaplain's support and availability.   Maurene Capes, [place] [phone]

## 2013-12-04 NOTE — ED Provider Notes (Signed)
I saw and evaluated the patient, reviewed the resident's note and I agree with the findings and plan.  EKG Interpretation    Date/Time:  Wednesday December 03 2013 18:51:21 EST Ventricular Rate:  153 PR Interval:    QRS Duration: 80 QT Interval:  300 QTC Calculation: 479 R Axis:   -22 Text Interpretation:  Atrial fibrillation with rapid ventricular response Cannot rule out Anterior infarct , age undetermined Abnormal ECG Confirmed by Anthonella Klausner  MD, Lisvet Rasheed ([id number]) on 12/04/2013 1:15:03 PM            Patient with recurrent afib, now with RVR. Controlled intermittently with metoprolol, eventually started on diltiazem gtt. Admit to cards.   Audree Camel, MD 12/04/13 [phone]

## 2013-12-04 NOTE — Progress Notes (Addendum)
ANTICOAGULATION CONSULT NOTE - Follow up Consult  Pharmacy Consult for heparin, coumadin  Indication: atrial fibrillation  Allergies  Allergen Reactions  . Lipitor [Atorvastatin Calcium] Swelling    Patient Measurements: Height: 5\' 9"  (175.3 cm) Weight: 246 lb 6.4 oz (111.766 kg) IBW/kg (Calculated) : 66.2 Heparin Dosing Weight: 95kg  Vital Signs: Temp: 97.9 F (36.6 C) (12/11 0632) Temp src: Oral (12/11 0632) BP: 120/64 mmHg (12/11 0632) Pulse Rate: 83 (12/11 0632)  Labs:  Recent Labs  12/03/13 1924 12/04/13 0605 12/04/13 0815  HGB 14.7 14.0 14.3  HCT 44.1 42.5 42.9  PLT 343 309 304  LABPROT  --   --  13.8  INR  --   --  1.08  HEPARINUNFRC  --   --  0.59  CREATININE 0.71 0.72  --     Estimated Creatinine Clearance: 105.9 ml/min (by C-G formula based on Cr of 0.72).   Assessment: 57yo female c/o chest pressure and palpitations w/ "heart skipping beats", last had problems w/ Afib 3-4 years ago, previously anticoagulated on Coumadin (CHADS2=1) though now no anticoagulant, now in Afib w/ RVR to 160s. Currently on heparin infusion. HL this AM was therapeutic at 0.59. CBC stable. Patient reports no bleeding.   Goal of Therapy:  Heparin level 0.3-0.7 units/ml Monitor platelets by anticoagulation protocol: Yes   Plan:  1) Decrease heparin drip to 1450 units/hr 2) F/u 6 hour HL, daily CBC, and daily HL 3) Monitor for s/s of bleeding  4) F/u long-term anticoagulation plans  Vinnie Level, PharmD.  Clinical Pharmacist Pager [phone]  Addendum: Coumadin added to heparin for treatment of Afib. Coumadin predictor points = 7. Patient states she stopped taking coumadin a few years ago.  Plan: 1) Given Coumadin 7.5 mg x 1 dose tonight 2) Monitor daily INR.   Vinnie Level, PharmD.  Clinical Pharmacist Pager [phone]

## 2013-12-05 LAB — TSH: TSH: 1.298 u[IU]/mL (ref 0.350–4.500)

## 2013-12-05 LAB — CBC
HCT: 40.3 % (ref 36.0–46.0)
Hemoglobin: 13.3 g/dL (ref 12.0–15.0)
MCH: 28.4 pg (ref 26.0–34.0)
MCHC: 33 g/dL (ref 30.0–36.0)
MCV: 85.9 fL (ref 78.0–100.0)
RDW: 15.3 % (ref 11.5–15.5)

## 2013-12-05 LAB — HEMOGLOBIN A1C: Hgb A1c MFr Bld: 5.8 % — ABNORMAL HIGH (ref <5.7)

## 2013-12-05 LAB — PROTIME-INR: Prothrombin Time: 13.7 seconds (ref 11.6–15.2)

## 2013-12-05 MED ORDER — ROSUVASTATIN CALCIUM 10 MG PO TABS
10.0000 mg | ORAL_TABLET | Freq: Every day | ORAL | Status: DC
  Administered 2013-12-05: 10 mg via ORAL
  Filled 2013-12-05 (×2): qty 1

## 2013-12-05 MED ORDER — DILTIAZEM HCL ER COATED BEADS 120 MG PO CP24
120.0000 mg | ORAL_CAPSULE | Freq: Every day | ORAL | Status: DC
  Administered 2013-12-05 – 2013-12-06 (×2): 120 mg via ORAL
  Filled 2013-12-05 (×2): qty 1

## 2013-12-05 MED ORDER — WARFARIN SODIUM 7.5 MG PO TABS
7.5000 mg | ORAL_TABLET | Freq: Once | ORAL | Status: AC
  Administered 2013-12-05: 7.5 mg via ORAL
  Filled 2013-12-05: qty 1

## 2013-12-05 NOTE — Progress Notes (Signed)
ANTICOAGULATION CONSULT NOTE - Follow up Consult  Pharmacy Consult for heparin, coumadin  Indication: atrial fibrillation  Allergies  Allergen Reactions  . Lipitor [Atorvastatin Calcium] Swelling    Patient Measurements: Height: 5\' 9"  (175.3 cm) Weight: 246 lb 4.8 oz (111.721 kg) IBW/kg (Calculated) : 66.2 Heparin Dosing Weight: 95kg  Vital Signs: Temp: 98.6 F (37 C) (12/12 0400) Temp src: Oral (12/12 0400) BP: 110/68 mmHg (12/12 0400) Pulse Rate: 65 (12/12 1038)  Labs:  Recent Labs  12/03/13 1924 12/04/13 0605 12/04/13 0815 12/04/13 1630 12/05/13 0640  HGB 14.7 14.0 14.3  --  13.3  HCT 44.1 42.5 42.9  --  40.3  PLT 343 309 304  --  297  LABPROT  --   --  13.8  --  13.7  INR  --   --  1.08  --  1.07  HEPARINUNFRC  --   --  0.59 0.41 0.54  CREATININE 0.71 0.72  --   --   --     Estimated Creatinine Clearance: 105.9 ml/min (by C-G formula based on Cr of 0.72).  Marland Kitchen diltiazem (CARDIZEM) infusion 5 mg/hr (12/05/13 0212)  . heparin 1,450 Units/hr (12/05/13 0726)    Assessment: 57 year old female receiving Heparin bridging to Coumadin for atrial fibrillation.  Her INR is unchanged as would be expected after only 1 dose of Coumadin.  Her heparin level remains therapeutic.  Goal of Therapy:  Heparin level 0.3-0.7 units/ml Monitor platelets by anticoagulation protocol: Yes   Plan:  Continue heparin drip at 1450 units/hr Coumadin 7.5mg  PO x 1 today Daily Heparin level, CBC, PT/INR Monitor for s/s of bleeding   Estella Husk, Pharm.D., BCPS, AAHIVP Clinical Pharmacist Phone: [phone] or [phone] 12/05/2013, 10:48 AM

## 2013-12-05 NOTE — Progress Notes (Signed)
PROGRESS NOTE  Subjective:   Jacqueline Petersen is a 57 yo with hx of Atrial fib in 2008.  She had a normal cardiac cath at that point.  She has not seen me since that time.   Hx of HTN, obesity, hyperlipidemia, GERD.   She converted to NSR yesterday.  Still on dilt and heparin.   Objective:    Vital Signs:   Temp:  [97.7 F (36.5 C)-99 F (37.2 C)] 98.6 F (37 C) (12/12 0400) Pulse Rate:  [67-77] 67 (12/12 0400) Resp:  [18] 18 (12/12 0400) BP: (110-130)/(60-75) 110/68 mmHg (12/12 0400) SpO2:  [97 %-100 %] 97 % (12/12 0400) Weight:  [246 lb 4.8 oz (111.721 kg)] 246 lb 4.8 oz (111.721 kg) (12/12 0400)  Last BM Date: 12/03/13   24-hour weight change: Weight change: -1.6 oz (-0.045 kg)  Weight trends: Filed Weights   12/03/13 2330 12/05/13 0400  Weight: 246 lb 6.4 oz (111.766 kg) 246 lb 4.8 oz (111.721 kg)    Intake/Output:  12/11 0701 - 12/12 0700 In: 991.3 [P.O.:600; I.V.:271.3] Out: 1600 [Urine:1600]     Physical Exam: BP 110/68  Pulse 67  Temp(Src) 98.6 F (37 C) (Oral)  Resp 18  Ht 5\' 9"  (1.753 m)  Wt 246 lb 4.8 oz (111.721 kg)  BMI 36.36 kg/m2  SpO2 97%  General: Vital signs reviewed and noted.   Head: Normocephalic, atraumatic.  Eyes: conjunctivae/corneas clear.  EOM's intact.   Throat: normal  Neck:  thick  Lungs:    clear  Heart: RR, normal S1, s2  Abdomen:  Soft, non-tender, non-distended  , obese  Extremities:  no edema   Neurologic: A&O X3, CN II - XII are grossly intact.   Psych: Normal     Labs: BMET:  Recent Labs  12/03/13 1924 12/04/13 0605  NA 140 140  K 4.0 3.8  CL 103 105  CO2 27 28  GLUCOSE 126* 120*  BUN 13 14  CREATININE 0.71 0.72  CALCIUM 9.2 9.0  MG  --  2.0    Liver function tests: No results found for this basename: AST, ALT, ALKPHOS, BILITOT, PROT, ALBUMIN,  in the last 72 hours No results found for this basename: LIPASE, AMYLASE,  in the last 72 hours  CBC:  Recent Labs  12/03/13 1924   12/04/13 0815 12/05/13 0640  WBC 13.4*  < > 9.4 8.7  NEUTROABS 4.6  --   --   --   HGB 14.7  < > 14.3 13.3  HCT 44.1  < > 42.9 40.3  MCV 86.3  < > 86.5 85.9  PLT 343  < > 304 297  < > = values in this interval not displayed.  Cardiac Enzymes: No results found for this basename: CKTOTAL, CKMB, TROPONINI,  in the last 72 hours  Coagulation Studies:  Recent Labs  12/04/13 0815 12/05/13 0640  LABPROT 13.8 13.7  INR 1.08 1.07    Other: No components found with this basename: POCBNP,    Other results:  Tele:  NSR at 60-70  Medications:    Infusions: . diltiazem (CARDIZEM) infusion 5 mg/hr (12/05/13 0212)  . heparin 1,450 Units/hr (12/05/13 0726)    Scheduled Medications: . metoprolol succinate  100 mg Oral Daily  . pantoprazole  40 mg Oral Daily  . simvastatin  40 mg Oral q1800  . Warfarin - Pharmacist Dosing Inpatient   Does not apply q1800    Assessment/ Plan:    1. Atrial fib:  She presents with atrial fib of unknown duration - she thinks maybe it just started yesterday but says that her HR has been fast for years.  She converted to NSR yesterday. Will DC dilt Cont toprol Cont. Coumadin .   Cont. Heparin - she does not think she could give herself Lovenox shots.  Will cont. Heparin until INR is theraputic.  She is on heparin and has been on coumadin in the past.  I think she will need coumadin long term.   A TSH was drawn yesterday ( according to epip) but no results are back today.  Will re-order.      Disposition:  Length of Stay: 2  Jacqueline Petersen, Jacqueline Petersen., MD, [name] 12/05/2013, 8:30 AM Office [phone] Pager [phone]

## 2013-12-05 NOTE — Progress Notes (Signed)
Pt converted to NSR during day time. Day shift RN, Rhae Lerner paged MD on call, orders to continue cardizem gtt until the am.

## 2013-12-06 LAB — PROTIME-INR
INR: 1.2 (ref 0.00–1.49)
Prothrombin Time: 14.9 seconds (ref 11.6–15.2)

## 2013-12-06 LAB — CBC
Hemoglobin: 13 g/dL (ref 12.0–15.0)
MCHC: 32.7 g/dL (ref 30.0–36.0)
RBC: 4.6 MIL/uL (ref 3.87–5.11)
WBC: 8.5 10*3/uL (ref 4.0–10.5)

## 2013-12-06 LAB — HEPARIN LEVEL (UNFRACTIONATED): Heparin Unfractionated: 0.51 IU/mL (ref 0.30–0.70)

## 2013-12-06 MED ORDER — WARFARIN SODIUM 10 MG PO TABS: 5.0000 mg | ORAL_TABLET | ORAL | Status: DC

## 2013-12-06 MED ORDER — DILTIAZEM HCL ER COATED BEADS 120 MG PO CP24: 120.0000 mg | ORAL_CAPSULE | Freq: Every day | ORAL | Status: DC

## 2013-12-06 MED ORDER — WARFARIN SODIUM 7.5 MG PO TABS
7.5000 mg | ORAL_TABLET | Freq: Once | ORAL | Status: DC
  Filled 2013-12-06: qty 1

## 2013-12-06 MED ORDER — WARFARIN SODIUM 10 MG PO TABS
10.0000 mg | ORAL_TABLET | ORAL | Status: AC
  Administered 2013-12-06: 10 mg via ORAL
  Filled 2013-12-06: qty 1

## 2013-12-06 NOTE — Progress Notes (Addendum)
ANTICOAGULATION CONSULT NOTE - Follow up Consult  Pharmacy Consult for heparin, coumadin  Indication: atrial fibrillation  Allergies  Allergen Reactions  . Lipitor [Atorvastatin Calcium] Swelling    Patient Measurements: Height: 5\' 9"  (175.3 cm) Weight: 246 lb 4.8 oz (111.721 kg) IBW/kg (Calculated) : 66.2 Heparin Dosing Weight: 95kg  Vital Signs: Temp: 98.1 F (36.7 C) (12/13 0400) BP: 127/72 mmHg (12/13 0400) Pulse Rate: 68 (12/13 0400)  Labs:  Recent Labs  12/03/13 1924 12/04/13 0605  12/04/13 0815 12/04/13 1630 12/05/13 0640 12/06/13 0510  HGB 14.7 14.0  --  14.3  --  13.3 13.0  HCT 44.1 42.5  --  42.9  --  40.3 39.7  PLT 343 309  --  304  --  297 307  LABPROT  --   --   --  13.8  --  13.7 14.9  INR  --   --   --  1.08  --  1.07 1.20  HEPARINUNFRC  --   --   < > 0.59 0.41 0.54 0.51  CREATININE 0.71 0.72  --   --   --   --   --   < > = values in this interval not displayed.  Estimated Creatinine Clearance: 105.9 ml/min (by C-G formula based on Cr of 0.72).  . heparin 1,450 Units/hr (12/06/13 0000)    Assessment: 57 year old female receiving Heparin bridging to Coumadin for atrial fibrillation.  Her INR has trended up moderately to 1.2 after 2 doses of coumadin.  Her heparin level remains therapeutic. CBC stable. Educated patient on coumadin today. No signs of bleeding noted.   Goal of Therapy:  Heparin level 0.3-0.7 units/ml Monitor platelets by anticoagulation protocol: Yes   Plan:  Continue heparin drip at 1450 units/hr Coumadin 7.5 mg PO x 1 today Daily Heparin level, CBC, PT/INR Monitor for s/s of bleeding   Vinnie Level, PharmD.  Clinical Pharmacist Pager [phone]  Addendum: Cardiology plans to give patient coumadin 10 mg x 1 dose today and then discharge patient. Plan is for patient to take 5 mg daily until checked in clinic Agree with plan. Will d/c 7.5 mg dose.   Pola Corn, PharmD.

## 2013-12-06 NOTE — Discharge Summary (Signed)
Physician Discharge Summary  Patient ID: Jacqueline Petersen MRN: [id number] DOB/AGE: May 01, 1956 57 y.o.  Admit date: 12/03/2013 Discharge date: 12/06/2013  Primary Discharge Diagnosis:  1. Atrial fib  Secondary Discharge Diagnosis: 1. Hypertension 2. Hyperlipidemia 3. GERD  Significant Diagnostic Studies:  1. Echocardiogram 12/04/2013 Left ventricle: Poor image quality even with contrast The cavity size was normal. Wall thickness was increased in a pattern of moderate LVH. Systolic function was normal. The estimated ejection fraction was in the range of 50% to 55%. Wall motion was normal; there were no regional wall motion abnormalities.   Consults: None  Hospital Course: Jacqueline Petersen is a 57 year old patient who was admitted on 12 10 2014 with complaints of palpitations. She will be followed by Dr. Elease Hashimoto. She has a history of paroxysmal atrial fibrillation, on Coumadin, hypertension, hyperlipidemia, and obesity. She presented to the emergency room was found to be in atrial fibrillation with RVR with heart rate in the 160s. She was given IV metoprolol, placed on a diltiazem drip. She converted to normal sinus rhythm the following day, and has been restarted on anticoagulation therapy with Coumadin. Diltiazem drip was discontinued and she was continued on metoprolol. She was restarted on Coumadin with a dose of 10 mg on day of discharge, to be followed by 5 mg daily. She is to followup in the Coumadin clinic on December 16 for ongoing assessment in dosing. And will be followed up by Dr. Elease Hashimoto on first available posthospitalization.   Discharge Exam: Blood pressure 117/62, pulse 67, temperature 97.5 F (36.4 C), temperature source Oral, resp. rate 18, height 5\' 9"  (1.753 m), weight 246 lb 4.8 oz (111.721 kg), SpO2 100.00%.   Labs:   Lab Results  Component Value Date   WBC 8.5 12/06/2013   HGB 13.0 12/06/2013   HCT 39.7 12/06/2013   MCV 86.3 12/06/2013   PLT 307  12/06/2013     Recent Labs Lab 12/04/13 0605  NA 140  K 3.8  CL 105  CO2 28  BUN 14  CREATININE 0.72  CALCIUM 9.0  GLUCOSE 120*       Lab Results  Component Value Date   CHOL 186 12/02/2010   CHOL 146 05/10/2010   CHOL 158 12/03/2009   Lab Results  Component Value Date   HDL 46 12/02/2010   HDL 47 7/82/9562   HDL 43 13/07/6577   Lab Results  Component Value Date   LDLCALC 114* 12/02/2010   LDLCALC 74 05/10/2010   LDLCALC 92 12/03/2009   Lab Results  Component Value Date   TRIG 128 12/02/2010   TRIG 125 05/10/2010   TRIG 116 12/03/2009   Lab Results  Component Value Date   CHOLHDL 4.0 Ratio 12/02/2010   CHOLHDL 3.1 Ratio 05/10/2010   CHOLHDL 3.7 Ratio 12/03/2009   No results found for this basename: LDLDIRECT      Radiology: Dg Chest Portable 1 View  12/03/2013   CLINICAL DATA:  Atrial fibrillation  EXAM: PORTABLE CHEST - 1 VIEW  COMPARISON:  October 13, 2013  FINDINGS: Lungs are clear. Heart size is upper normal with normal pulmonary vascularity. No adenopathy. No bone lesions.  IMPRESSION: Heart upper normal in size.  No edema or consolidation.   Electronically Signed   By: Bretta Bang M.D.   On: 12/03/2013 19:33    EKG: NSR rate of  68 bpm.  FOLLOW UP PLANS AND APPOINTMENTS Discharge Orders   Future Orders Complete By Expires   Diet - low sodium  heart healthy  As directed    Increase activity slowly  As directed        Medication List         aspirin EC 81 MG tablet  Take 81 mg by mouth daily.     B-complex with vitamin C tablet  Take 1 tablet by mouth daily.     cholecalciferol 1000 UNITS tablet  Commonly known as:  VITAMIN D  Take 1,000 Units by mouth daily.     Cinnamon 500 MG capsule  Take 1,000 mg by mouth daily.              metoprolol succinate 100 MG 24 hr tablet  Commonly known as:  TOPROL-XL  Take 100 mg by mouth at bedtime. Take with or immediately following a meal.     olmesartan 20 MG tablet  Commonly known as:   BENICAR  Take 20 mg by mouth at bedtime.     omeprazole 20 MG tablet  Commonly known as:  PRILOSEC OTC  Take 20 mg by mouth daily.     ONE-A-DAY WOMENS FORMULA PO  Take 1 tablet by mouth daily.     rosuvastatin 10 MG tablet  Commonly known as:  CRESTOR  Take 10 mg by mouth at bedtime.     warfarin 10 MG tablet  Commonly known as:  COUMADIN  Take 0.5 tablets (5 mg total) by mouth now.       Follow-up Information   Follow up with [facility] On 12/09/2013. (Office will call you for appt time.)    Contact information:   [address] [place] [address]       Follow up with Elyn Aquas., MD. (Office will call you for appt time.)    Specialty:  Cardiology   Contact information:   [address] [place] [address] [phone]         Time spent with patient to include physician time: 35 minutes. Signed: Joni Reining 12/06/2013, 11:07 AM Co-Sign MD

## 2013-12-06 NOTE — Progress Notes (Signed)
Patient ID: Jacqueline Petersen, female   DOB: [birth year], 57 y.o.   MRN: [id number]      PROGRESS NOTE  Subjective:   Ms. Jacqueline Petersen is a 57 yo with hx of Atrial fib in 2008.  She had a normal cardiac cath at that point.  She has not seen me since that time.   Hx of HTN, obesity, hyperlipidemia, GERD.    Objective:    Vital Signs:   Temp:  [97.5 F (36.4 C)-98.5 F (36.9 C)] 97.5 F (36.4 C) (12/13 0821) Pulse Rate:  [63-75] 65 (12/13 0821) Resp:  [18-20] 18 (12/13 0821) BP: (114-140)/(64-74) 140/71 mmHg (12/13 0821) SpO2:  [94 %-100 %] 100 % (12/13 0821)  Last BM Date: 12/05/13   24-hour weight change: Weight change:   Weight trends: Filed Weights   12/03/13 2330 12/05/13 0400  Weight: 246 lb 6.4 oz (111.766 kg) 246 lb 4.8 oz (111.721 kg)    Intake/Output:  12/12 0701 - 12/13 0700 In: -  Out: 1050 [Urine:1050]     Physical Exam: BP 140/71  Pulse 65  Temp(Src) 97.5 F (36.4 C) (Oral)  Resp 18  Ht 5\' 9"  (1.753 m)  Wt 246 lb 4.8 oz (111.721 kg)  BMI 36.36 kg/m2  SpO2 100%  General: Vital signs reviewed and noted.   Head: Normocephalic, atraumatic.  Eyes: conjunctivae/corneas clear.  EOM's intact.   Throat: normal  Neck:  thick  Lungs:    clear  Heart:  irreg. Irreg.   Abdomen:  Soft, non-tender, non-distended  , obese  Extremities:  no edema   Neurologic: A&O X3, CN II - XII are grossly intact.   Psych: Normal     Labs: BMET:  Recent Labs  12/03/13 1924 12/04/13 0605  NA 140 140  K 4.0 3.8  CL 103 105  CO2 27 28  GLUCOSE 126* 120*  BUN 13 14  CREATININE 0.71 0.72  CALCIUM 9.2 9.0  MG  --  2.0    Liver function tests: No results found for this basename: AST, ALT, ALKPHOS, BILITOT, PROT, ALBUMIN,  in the last 72 hours No results found for this basename: LIPASE, AMYLASE,  in the last 72 hours  CBC:  Recent Labs  12/03/13 1924  12/05/13 0640 12/06/13 0510  WBC 13.4*  < > 8.7 8.5  NEUTROABS 4.6  --   --   --   HGB 14.7  <  > 13.3 13.0  HCT 44.1  < > 40.3 39.7  MCV 86.3  < > 85.9 86.3  PLT 343  < > 297 307  < > = values in this interval not displayed.   Coagulation Studies:  Recent Labs  12/04/13 0815 12/05/13 0640 12/06/13 0510  LABPROT 13.8 13.7 14.9  INR 1.08 1.07 1.20    Recent Labs  12/04/13 0605  HGBA1C 5.8*    Recent Labs  12/04/13 0605  TSH 1.298     Other results:  Tele:  NSR 12/06/2013     Medications:    Infusions: . heparin 1,450 Units/hr (12/06/13 0000)    Scheduled Medications: . diltiazem  120 mg Oral Daily  . metoprolol succinate  100 mg Oral Daily  . pantoprazole  40 mg Oral Daily  . rosuvastatin  10 mg Oral q1800  . warfarin  7.5 mg Oral ONCE-1800  . Warfarin - Pharmacist Dosing Inpatient   Does not apply q1800    Assessment/ Plan:    1. Atrial fib:  Spontaneous conversion on beta blocker She  wants to go home  She has a husband with myeloma and a son who is  Bipolar.  She does not want lovenox She understands small risk of stroke while INR builds up.  Will d/c today and have her f/u  In coumadin clinic Tuesday.  Would have her take 10mg  today will give before d/c then 5mg  daily until checked in clinic  F/U Nahser next available    Disposition:  Length of Stay: 3  Vesta Mixer, Montez Hageman., MD, [facility] 12/06/2013, 9:47 AM Office [phone] Pager [phone]

## 2013-12-09 ENCOUNTER — Telehealth: Payer: Self-pay | Admitting: Cardiovascular Disease

## 2013-12-09 ENCOUNTER — Ambulatory Visit (INDEPENDENT_AMBULATORY_CARE_PROVIDER_SITE_OTHER): Payer: No Typology Code available for payment source | Admitting: *Deleted

## 2013-12-09 DIAGNOSIS — Z7901 Long term (current) use of anticoagulants: Secondary | ICD-10-CM

## 2013-12-09 DIAGNOSIS — I4891 Unspecified atrial fibrillation: Secondary | ICD-10-CM

## 2013-12-09 LAB — POCT INR: INR: 4

## 2013-12-09 NOTE — Telephone Encounter (Signed)
New Problem:  FYI... Pt called to make appt per discharge instructions of Joni Reining. Follow up with [facility] On 12/09/2013. (Office will call you for appt time.)   Follow up with Elyn Aquas., MD. (Office will call you for appt time.)   Pt made Coumadin appt but refused to schedule appt with Dr. Elease Hashimoto or PA.

## 2013-12-09 NOTE — Telephone Encounter (Signed)
Pt made a f/u app to establish.

## 2013-12-09 NOTE — Telephone Encounter (Signed)
Pt has an app today with our Coumadin clinic. I spoke with Alfonse Ras Pharm-D to update him. PT will be encourage to make an app with one of our providers so that care can continue here. She may need to be followed by her pcp if she does not want to be followed by any of our [facility]ologists.

## 2013-12-09 NOTE — Patient Instructions (Signed)

## 2013-12-16 ENCOUNTER — Encounter (INDEPENDENT_AMBULATORY_CARE_PROVIDER_SITE_OTHER): Payer: Self-pay

## 2013-12-16 ENCOUNTER — Encounter: Payer: Self-pay | Admitting: *Deleted

## 2013-12-16 ENCOUNTER — Ambulatory Visit (INDEPENDENT_AMBULATORY_CARE_PROVIDER_SITE_OTHER): Payer: No Typology Code available for payment source | Admitting: Physician Assistant

## 2013-12-16 ENCOUNTER — Encounter: Payer: Self-pay | Admitting: Physician Assistant

## 2013-12-16 ENCOUNTER — Ambulatory Visit (INDEPENDENT_AMBULATORY_CARE_PROVIDER_SITE_OTHER): Payer: No Typology Code available for payment source | Admitting: Pharmacist

## 2013-12-16 VITALS — BP 130/70 | HR 74 | Ht 69.0 in | Wt 252.0 lb

## 2013-12-16 DIAGNOSIS — E785 Hyperlipidemia, unspecified: Secondary | ICD-10-CM

## 2013-12-16 DIAGNOSIS — I4891 Unspecified atrial fibrillation: Secondary | ICD-10-CM

## 2013-12-16 DIAGNOSIS — Z7901 Long term (current) use of anticoagulants: Secondary | ICD-10-CM

## 2013-12-16 DIAGNOSIS — I10 Essential (primary) hypertension: Secondary | ICD-10-CM

## 2013-12-16 LAB — POCT INR: INR: 5.1

## 2013-12-16 NOTE — Progress Notes (Signed)
[address] [place], [place]  [address] Phone: [phone] Fax:  [phone]  Date:  12/16/2013   ID:  Jacqueline Petersen, Jacqueline Petersen [birth year], MRN [id number]  PCP:  Provider Not In System  Cardiologist:  Dr. Delane Ginger    History of Present Illness: Jacqueline Petersen is a 57 y.o. female with a hx of HTN, HL, paroxysmal atrial fibrillation previously on Coumadin, GERD and obesity. LHC (03/2007): Normal coronary arteries, EF 50-55%. Patient was recently admitted 12/10-12/13 after presenting with palpitations. She was found to be in atrial fibrillation with RVR. She converted to NSR on IV diltiazem. It was felt that she needed long-term anticoagulation. CHADS2-VASc=2.  She was placed on Coumadin. Echocardiogram (12/04/2013): Moderate LVH, EF 50-55%, normal wall motion.  She has generally been doing okay since discharge. She feels fatigued. She's had occasional palpitations. These are fairly consistent with what she has had in the past. She denies any prolonged rapid palpitations. She denies chest pain, dyspnea, syncope, orthopnea, PND or edema. She is compliant with her CPAP.  Recent Labs: 12/04/2013: Creatinine 0.72; Potassium 3.8; TSH 1.298  12/06/2013: Hemoglobin 13.0   Wt Readings from Last 3 Encounters:  12/16/13 252 lb (114.306 kg)  12/05/13 246 lb 4.8 oz (111.721 kg)  10/13/13 255 lb 11.2 oz (115.985 kg)     Past Medical History  Diagnosis Date  . Hypertension   . A-fib   . History of hysterectomy   . Acid reflux   . Hyperlipidemia   . Chronic kidney failure     pt on dialysis  . Fatigue   . Personal history of long-term (current) use of anticoagulants   . Dyslipidemia   . GERD (gastroesophageal reflux disease)   . Hypokalemia   . Personal history of hypertensive heart disease   . Obesity     Current Outpatient Prescriptions  Medication Sig Dispense Refill  . aspirin EC 81 MG tablet Take 81 mg by mouth daily.        . B Complex-C  (B-COMPLEX WITH VITAMIN C) tablet Take 1 tablet by mouth daily.      . Cholecalciferol (VITAMIN D3) 2000 UNITS TABS Take 2,000 mg by mouth daily.      . Cinnamon 500 MG capsule Take 1,000 mg by mouth daily.      . metoprolol succinate (TOPROL-XL) 100 MG 24 hr tablet Take 100 mg by mouth at bedtime. Take with or immediately following a meal.      . Multiple Minerals-Vitamins (CALCIUM & VIT D3 BONE HEALTH PO) Take 2,000 mg by mouth daily.      . Multiple Vitamins-Calcium (ONE-A-DAY WOMENS FORMULA PO) Take 1 tablet by mouth daily.      Marland Kitchen olmesartan (BENICAR) 20 MG tablet Take 20 mg by mouth at bedtime.      Marland Kitchen omeprazole (PRILOSEC OTC) 20 MG tablet Take 20 mg by mouth daily.      . rosuvastatin (CRESTOR) 10 MG tablet Take 10 mg by mouth at bedtime.       Marland Kitchen warfarin (COUMADIN) 10 MG tablet Take 5 mg by mouth now. Take 10 mg on Monday and T-W-TH-5 MG and FR take 10 mg       No current facility-administered medications for this visit.    Allergies:   Lipitor   Social History:  The patient  reports that she has never smoked. She does not have any smokeless tobacco history on file. She reports that she does not drink alcohol or use  illicit drugs.   Family History:  The patient's family history includes Coronary artery disease in an other family member; Hypertension in an other family member.   ROS:  Please see the history of present illness.   She denies any bleeding problems. She has had some recent URI symptoms. She denies fever.   All other systems reviewed and negative.   PHYSICAL EXAM: VS:  BP 130/70  Pulse 74  Ht 5\' 9"  (1.753 m)  Wt 252 lb (114.306 kg)  BMI 37.20 kg/m2 Well nourished, well developed, in no acute distress HEENT: normal Neck: no JVD Cardiac:  normal S1, S2; RRR; no murmur Lungs:  clear to auscultation bilaterally, no wheezing, rhonchi or rales Abd: soft, nontender, no hepatomegaly Ext: no edema Skin: warm and dry Neuro:  CNs 2-12 intact, no focal abnormalities  noted  EKG:  NSR, HR 74, LAD, nonspecific ST-T wave changes, no change from prior tracing     ASSESSMENT AND PLAN:  1. Atrial Fibrillation:  Maintaining NSR. She has had very brief palpitations since d/c. If these become more prolonged or more frequent, she may require event monitoring. Continue Coumadin. Continue Toprol. She can discontinue her aspirin. I will give her a note to return to work. 2. Hypertension:  Controlled.  3. Hyperlipidemia:  Continue statin.  This is managed by primary care.  4. Disposition:  F/u with Dr. Delane Ginger in 3 mos.  Signed, Tereso Newcomer, PA-C  12/16/2013 3:54 PM

## 2013-12-16 NOTE — Patient Instructions (Signed)
STOP ASPIRIN AS OF TODAY PER SCOTT WEAVER, PAC  YOU HAVE BEEN GIVEN A WORK NOTE TO RETURN TO WORK AS OF TODAY WITHOUT ANY RESTRICTIONS  Your physician recommends that you schedule a follow-up appointment in: 2-3 MONTHS WITH DR. Elease Hashimoto PER SCOTT WEAVER, PAC

## 2013-12-23 ENCOUNTER — Ambulatory Visit (INDEPENDENT_AMBULATORY_CARE_PROVIDER_SITE_OTHER): Payer: No Typology Code available for payment source

## 2013-12-23 DIAGNOSIS — Z7901 Long term (current) use of anticoagulants: Secondary | ICD-10-CM

## 2013-12-23 DIAGNOSIS — I4891 Unspecified atrial fibrillation: Secondary | ICD-10-CM

## 2014-01-06 ENCOUNTER — Ambulatory Visit (INDEPENDENT_AMBULATORY_CARE_PROVIDER_SITE_OTHER): Payer: No Typology Code available for payment source | Admitting: *Deleted

## 2014-01-06 DIAGNOSIS — Z7901 Long term (current) use of anticoagulants: Secondary | ICD-10-CM

## 2014-01-06 DIAGNOSIS — I4891 Unspecified atrial fibrillation: Secondary | ICD-10-CM

## 2014-01-06 LAB — POCT INR: INR: 2.6

## 2014-01-22 ENCOUNTER — Other Ambulatory Visit (HOSPITAL_COMMUNITY): Payer: Self-pay | Admitting: Physician Assistant

## 2014-01-22 DIAGNOSIS — Z1231 Encounter for screening mammogram for malignant neoplasm of breast: Secondary | ICD-10-CM

## 2014-01-23 ENCOUNTER — Ambulatory Visit (HOSPITAL_COMMUNITY)
Admission: RE | Admit: 2014-01-23 | Discharge: 2014-01-23 | Disposition: A | Discharge status: Home / Self Care | Payer: No Typology Code available for payment source | Source: Ambulatory Visit | Attending: Physician Assistant | Admitting: Physician Assistant

## 2014-01-23 DIAGNOSIS — Z1231 Encounter for screening mammogram for malignant neoplasm of breast: Secondary | ICD-10-CM

## 2014-03-02 ENCOUNTER — Ambulatory Visit (INDEPENDENT_AMBULATORY_CARE_PROVIDER_SITE_OTHER): Payer: No Typology Code available for payment source | Admitting: *Deleted

## 2014-03-02 DIAGNOSIS — Z7901 Long term (current) use of anticoagulants: Secondary | ICD-10-CM

## 2014-03-02 DIAGNOSIS — I4891 Unspecified atrial fibrillation: Secondary | ICD-10-CM

## 2014-03-02 LAB — POCT INR: INR: 2.1

## 2014-03-12 ENCOUNTER — Encounter: Payer: Self-pay | Admitting: Cardiovascular Disease

## 2014-03-12 ENCOUNTER — Ambulatory Visit (INDEPENDENT_AMBULATORY_CARE_PROVIDER_SITE_OTHER): Payer: No Typology Code available for payment source | Admitting: Cardiovascular Disease

## 2014-03-12 ENCOUNTER — Ambulatory Visit: Payer: No Typology Code available for payment source | Admitting: Cardiovascular Disease

## 2014-03-12 VITALS — BP 160/88 | HR 84 | Ht 69.0 in | Wt 254.8 lb

## 2014-03-12 DIAGNOSIS — I4891 Unspecified atrial fibrillation: Secondary | ICD-10-CM

## 2014-03-12 DIAGNOSIS — I10 Essential (primary) hypertension: Secondary | ICD-10-CM

## 2014-03-12 MED ORDER — AMLODIPINE BESYLATE 5 MG PO TABS: 5.0000 mg | ORAL_TABLET | Freq: Every day | ORAL | Status: DC

## 2014-03-12 NOTE — Patient Instructions (Addendum)
Your physician wants you to follow-up in: 6 months  You will receive a reminder letter in the mail two months in advance. If you don't receive a letter, please call our office to schedule the follow-up appointment.   Your physician has recommended you make the following change in your medication:  Start amlodipine 5 mg daily///script given  Please get your cpap mask looked at / call your primary or pulmonologist

## 2014-03-12 NOTE — Assessment & Plan Note (Signed)
Her rhythm is regular today. As suspected she is in normal sinus rhythm.  I think that her symptoms could be due to her sleep apnea. She has a CPAP mask but she ends up taking it off in the middle of night every time she tries to wear. She should be reevaluated for this. This would explain her daytime sleepiness, paroxysmal atrial fibrillation, hypertension, and general lack of energy.  We'll continue her Coumadin for now to prevent her risk of stroke.

## 2014-03-12 NOTE — Assessment & Plan Note (Signed)
Her blood pressure is poorly controlled today. As noted earlier, I think that she has uncontrolled sleep apnea. I've recommended that she exercise on regular basis. I've recommended that she lose weight. We will add amlodipine 5 mg a day. I'll see her in 6 months. She will need to see her general medical Dr.

## 2014-03-12 NOTE — Progress Notes (Signed)
[address] [place], [place]  [address] Phone: [phone] Fax:  [phone]  Date:  03/12/2014   ID:  Jacqueline Petersen, Jacqueline Petersen [birth year], MRN [id number]  PCP:  [facility].  Problem list: 1. Hypertension 2. Hyperlipidemia 3. Paroxysmal atrial fibrillation 4. Obesity 5. Obstructive sleep apnea     History of Present Illness: Jacqueline Petersen is a 58 y.o. female with a hx of HTN, HL, paroxysmal atrial fibrillation previously on Coumadin, GERD and obesity. LHC (03/2007): Normal coronary arteries, EF 50-55%. Patient was recently admitted 12/10-12/13 after presenting with palpitations. She was found to be in atrial fibrillation with RVR. She converted to NSR on IV diltiazem. It was felt that she needed long-term anticoagulation. CHADS2-VASc=2.  She was placed on Coumadin. Echocardiogram (12/04/2013): Moderate LVH, EF 50-55%, normal wall motion.  She has generally been doing okay since discharge. She feels fatigued. She's had occasional palpitations. These are fairly consistent with what she has had in the past. She denies any prolonged rapid palpitations. She denies chest pain, dyspnea, syncope, orthopnea, PND or edema. She is compliant with her CPAP.   March 12, 2014: Jacqueline Petersen has a hx of AF in the remote past.   She has normal coronaries.  She was admitted with Afib- and converted with dilt drip.  She continues to have problems with HTN.  She complains about pain and firmness in her left leg.  She has continued to gain weight He has a history of sleep apnea. She does not look like wearing her CPAP mask because it is uncomfortable at night.    Recent Labs: 12/04/2013: Creatinine 0.72; Potassium 3.8; TSH 1.298  12/06/2013: Hemoglobin 13.0   Wt Readings from Last 3 Encounters:  03/12/14 254 lb 12.8 oz (115.577 kg)  12/16/13 252 lb (114.306 kg)  12/05/13 246 lb 4.8 oz (111.721 kg)     Past Medical History  Diagnosis Date  .  Hypertension   . A-fib   . History of hysterectomy   . Acid reflux   . Hyperlipidemia   . Chronic kidney failure     pt on dialysis  . Fatigue   . Personal history of long-term (current) use of anticoagulants   . Dyslipidemia   . GERD (gastroesophageal reflux disease)   . Hypokalemia   . Personal history of hypertensive heart disease   . Obesity     Current Outpatient Prescriptions  Medication Sig Dispense Refill  . B Complex-C (B-COMPLEX WITH VITAMIN C) tablet Take 1 tablet by mouth daily.      . Cholecalciferol (VITAMIN D3) 2000 UNITS TABS Take 2,000 mg by mouth daily.      . Cinnamon 500 MG capsule Take 1,000 mg by mouth daily.      . metoprolol succinate (TOPROL-XL) 100 MG 24 hr tablet Take 100 mg by mouth at bedtime. Take with or immediately following a meal.      . omeprazole (PRILOSEC OTC) 20 MG tablet Take 20 mg by mouth daily.      . rosuvastatin (CRESTOR) 10 MG tablet Take 10 mg by mouth at bedtime.       Marland Kitchen warfarin (COUMADIN) 10 MG tablet Take 5 mg by mouth now. Take 10 mg on Monday and T-W-TH-5 MG and FR take 10 mg       No current facility-administered medications for this visit.    Allergies:   Lipitor   Social History:  The patient  reports that she has never smoked. She does  not have any smokeless tobacco history on file. She reports that she does not drink alcohol or use illicit drugs.   Family History:  The patient's family history includes Coronary artery disease in an other family member; Hypertension in an other family member.   ROS:  Please see the history of present illness.   She denies any bleeding problems. She has had some recent URI symptoms. She denies fever.   All other systems reviewed and negative.   PHYSICAL EXAM: VS:  BP 160/88  Pulse 84  Ht 5\' 9"  (1.753 m)  Wt 254 lb 12.8 oz (115.577 kg)  BMI 37.61 kg/m2 Well nourished, well developed, in no acute distress HEENT: normal Neck: no JVD Cardiac:  normal S1, S2; RRR; no murmur Lungs:  clear  to auscultation bilaterally, no wheezing, rhonchi or rales Abd: soft, nontender, no hepatomegaly Ext: no edema Skin: warm and dry Neuro:  CNs 2-12 intact, no focal abnormalities noted  EKG:  NSR, HR 74, LAD, nonspecific ST-T wave changes, no change from prior tracing     ASSESSMENT AND PLAN:  1. Atrial Fibrillation:  Maintaining NSR. She has had very brief palpitations since d/c. If these become more prolonged or more frequent, she may require event monitoring. Continue Coumadin. Continue Toprol. She can discontinue her aspirin. I will give her a note to return to work. 2. Hypertension:  Controlled.  3. Hyperlipidemia:  Continue statin.  This is managed by primary care.  4. Disposition:  F/u with Dr. Liam Rogers in 3 mos.  Signed, [name], PA-C  03/12/2014 5:01 PM

## 2014-03-16 ENCOUNTER — Encounter (HOSPITAL_COMMUNITY): Payer: Self-pay | Admitting: Emergency Medicine

## 2014-03-16 ENCOUNTER — Emergency Department (HOSPITAL_COMMUNITY)
Admission: EM | Admit: 2014-03-16 | Discharge: 2014-03-16 | Disposition: A | Discharge status: Home / Self Care | Payer: No Typology Code available for payment source | Attending: Emergency Medicine | Admitting: Emergency Medicine

## 2014-03-16 DIAGNOSIS — M79609 Pain in unspecified limb: Secondary | ICD-10-CM | POA: Insufficient documentation

## 2014-03-16 DIAGNOSIS — Z79899 Other long term (current) drug therapy: Secondary | ICD-10-CM | POA: Insufficient documentation

## 2014-03-16 DIAGNOSIS — IMO0001 Reserved for inherently not codable concepts without codable children: Secondary | ICD-10-CM | POA: Insufficient documentation

## 2014-03-16 DIAGNOSIS — E669 Obesity, unspecified: Secondary | ICD-10-CM | POA: Insufficient documentation

## 2014-03-16 DIAGNOSIS — K219 Gastro-esophageal reflux disease without esophagitis: Secondary | ICD-10-CM | POA: Insufficient documentation

## 2014-03-16 DIAGNOSIS — Z90710 Acquired absence of both cervix and uterus: Secondary | ICD-10-CM | POA: Insufficient documentation

## 2014-03-16 DIAGNOSIS — R209 Unspecified disturbances of skin sensation: Secondary | ICD-10-CM | POA: Insufficient documentation

## 2014-03-16 DIAGNOSIS — I1311 Hypertensive heart and chronic kidney disease without heart failure, with stage 5 chronic kidney disease, or end stage renal disease: Secondary | ICD-10-CM | POA: Insufficient documentation

## 2014-03-16 DIAGNOSIS — Z7901 Long term (current) use of anticoagulants: Secondary | ICD-10-CM | POA: Insufficient documentation

## 2014-03-16 DIAGNOSIS — M79605 Pain in left leg: Secondary | ICD-10-CM

## 2014-03-16 DIAGNOSIS — N186 End stage renal disease: Secondary | ICD-10-CM | POA: Insufficient documentation

## 2014-03-16 DIAGNOSIS — M79604 Pain in right leg: Secondary | ICD-10-CM

## 2014-03-16 DIAGNOSIS — I4891 Unspecified atrial fibrillation: Secondary | ICD-10-CM | POA: Insufficient documentation

## 2014-03-16 DIAGNOSIS — E785 Hyperlipidemia, unspecified: Secondary | ICD-10-CM | POA: Insufficient documentation

## 2014-03-16 DIAGNOSIS — G8929 Other chronic pain: Secondary | ICD-10-CM | POA: Insufficient documentation

## 2014-03-16 MED ORDER — TRAMADOL HCL 50 MG PO TABS: 50.0000 mg | ORAL_TABLET | Freq: Four times a day (QID) | ORAL | Status: DC | PRN

## 2014-03-16 MED ORDER — CYCLOBENZAPRINE HCL 10 MG PO TABS: 10.0000 mg | ORAL_TABLET | Freq: Two times a day (BID) | ORAL | Status: DC | PRN

## 2014-03-16 NOTE — Progress Notes (Signed)
[facility] CL spoke with patient about [name]. Patient confirmed her PCP was [facility]. Cl scheduled patient a follow-up apt with PCP on 4/2 at 8:15 am.

## 2014-03-16 NOTE — ED Notes (Signed)
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  Pt states that at night her legs get real tight.

## 2014-03-16 NOTE — ED Provider Notes (Signed)
CSN: [id number]     Arrival date & time 03/16/14  1217 History  This chart was scribed for non-physician practitioner, Noland Fordyce, PA-C working with Tanna Furry, MD by Frederich Balding, ED scribe. This patient was seen in room WTR5/WTR5 and the patient's care was started at 2:28 PM.   Chief Complaint  Patient presents with  . Leg Pain   The history is provided by the patient. No language interpreter was used.   HPI Comments: Jacqueline Petersen is a 58 y.o. female who presents to the Emergency Department complaining of chronic bilateral leg pain, left worse than right, that started 2 years ago. She also gets intermittent numbness. Pt states she works at a job where she has to stand for long periods of time. She states her legs get really tight at night and she sometimes gets palpitations because of the pain. Bearing weight worsens the pain. Pt has taken tylenol with no relief. Denies fever, nausea, emesis, back pain, calf pain.  Past Medical History  Diagnosis Date  . Hypertension   . A-fib   . History of hysterectomy   . Acid reflux   . Hyperlipidemia   . Chronic kidney failure     pt on dialysis  . Fatigue   . Personal history of long-term (current) use of anticoagulants   . Dyslipidemia   . GERD (gastroesophageal reflux disease)   . Hypokalemia   . Personal history of hypertensive heart disease   . Obesity    Past Surgical History  Procedure Laterality Date  . Partial hysterectomy    . Cardiac catheterization  04/15/2007    Est. EF of 50-55% -- Smooth and normal coronary arteries -- Normal left ventricular systolic function.  We will continue with further treatment of her atrial fibrillation     . Abdominal hysterectomy     Family History  Problem Relation Age of Onset  . Coronary artery disease    . Hypertension     History  Substance Use Topics  . Smoking status: Never Smoker   . Smokeless tobacco: Not on file  . Alcohol Use: No   OB History   Grav Para Term  Preterm Abortions TAB SAB Ect Mult Living                 Review of Systems  Constitutional: Negative for fever.  Gastrointestinal: Negative for nausea and vomiting.  Musculoskeletal: Positive for myalgias. Negative for back pain.  Neurological: Negative for numbness.  All other systems reviewed and are negative.   Allergies  Lipitor  Home Medications   Current Outpatient Rx  Name  Route  Sig  Dispense  Refill  . amLODipine (NORVASC) 5 MG tablet   Oral   Take 1 tablet (5 mg total) by mouth daily.   180 tablet   1   . B Complex-C (B-COMPLEX WITH VITAMIN C) tablet   Oral   Take 1 tablet by mouth daily.         . Cinnamon 500 MG capsule   Oral   Take 1,000 mg by mouth daily.         . metoprolol succinate (TOPROL-XL) 100 MG 24 hr tablet   Oral   Take 100 mg by mouth at bedtime. Take with or immediately following a meal.         . omeprazole (PRILOSEC OTC) 20 MG tablet   Oral   Take 40 mg by mouth daily.          Marland Kitchen  rosuvastatin (CRESTOR) 10 MG tablet   Oral   Take 10 mg by mouth at bedtime.          Marland Kitchen warfarin (COUMADIN) 10 MG tablet   Oral   Take 5 mg by mouth now. Takes 5 mg daily except on Fri when patient takes 2.5 mg.         . cyclobenzaprine (FLEXERIL) 10 MG tablet   Oral   Take 1 tablet (10 mg total) by mouth 2 (two) times daily as needed for muscle spasms.   20 tablet   0   . traMADol (ULTRAM) 50 MG tablet   Oral   Take 1 tablet (50 mg total) by mouth every 6 (six) hours as needed.   15 tablet   0    BP 151/77  Pulse 71  Temp(Src) 98.4 F (36.9 C) (Oral)  Resp 18  SpO2 96%  Physical Exam  Nursing note and vitals reviewed. Constitutional: She is oriented to person, place, and time. She appears well-developed and well-nourished.  HENT:  Head: Normocephalic and atraumatic.  Eyes: EOM are normal.  Neck: Normal range of motion.  Cardiovascular: Normal rate.   Pulmonary/Chest: Effort normal.  Musculoskeletal: Normal range of  motion. She exhibits tenderness. She exhibits no edema.  FROM right and left hip and knees. Palpable muscle spasms and tenderness to left lateral thigh.   Neurological: She is alert and oriented to person, place, and time.  Skin: Skin is warm and dry.  Skin in tact. No ecchymosis, erythema, or warmth. No red streaking, induration, or evidence of underlying infection.  Psychiatric: She has a normal mood and affect. Her behavior is normal.    ED Course  Procedures (including critical care time)  DIAGNOSTIC STUDIES: Oxygen Saturation is 96% on RA, normal by my interpretation.    COORDINATION OF CARE: 2:32 PM-Discussed treatment plan which includes motrin, a muscle relaxer and pain medication with pt at bedside and pt agreed to plan.   Labs Review Labs Reviewed - No data to display Imaging Review No results found.   EKG Interpretation None      MDM   Final diagnoses:  Bilateral leg pain    pt c/o bilateral leg pain x2 years but worsening after recently starting a job working program where she has to stand for many hours at a time.  No hx of falls or trauma. On exam pt does have muscular tenderness. FROM both hips and knees. No evidence of underlying infection.  Do not believe imaging would be of benefit at this time. Will tx pain with tramadol and flexeril.  Pt cannot have ibuprofen as she is currently taking coumadin for afib.  Discussed RICE tx. Advised to f/u with PCP, [place] and [place] if symptoms not improving. Return precautions provided. Pt verbalized understanding and agreement with tx plan.   I personally performed the services described in this documentation, which was scribed in my presence. The recorded information has been reviewed and is accurate.   Noland Fordyce, PA-C 03/17/14 1215

## 2014-03-21 NOTE — ED Provider Notes (Signed)
Medical screening examination/treatment/procedure(s) were performed by non-physician practitioner and as supervising physician I was immediately available for consultation/collaboration.   EKG Interpretation None        Tanna Furry, MD 03/21/14 1102

## 2014-04-03 ENCOUNTER — Encounter (INDEPENDENT_AMBULATORY_CARE_PROVIDER_SITE_OTHER): Payer: No Typology Code available for payment source

## 2014-04-03 DIAGNOSIS — I4891 Unspecified atrial fibrillation: Secondary | ICD-10-CM

## 2014-04-03 DIAGNOSIS — Z7901 Long term (current) use of anticoagulants: Secondary | ICD-10-CM

## 2014-04-06 ENCOUNTER — Ambulatory Visit (INDEPENDENT_AMBULATORY_CARE_PROVIDER_SITE_OTHER): Payer: No Typology Code available for payment source | Admitting: Pharmacist

## 2014-04-06 DIAGNOSIS — I4891 Unspecified atrial fibrillation: Secondary | ICD-10-CM

## 2014-04-06 DIAGNOSIS — Z7901 Long term (current) use of anticoagulants: Secondary | ICD-10-CM

## 2014-04-06 LAB — POCT INR: INR: 1.9

## 2014-05-06 ENCOUNTER — Emergency Department (HOSPITAL_COMMUNITY)
Admission: EM | Admit: 2014-05-06 | Discharge: 2014-05-06 | Disposition: A | Discharge status: Home / Self Care | Payer: No Typology Code available for payment source | Attending: Emergency Medicine | Admitting: Emergency Medicine

## 2014-05-06 ENCOUNTER — Encounter (HOSPITAL_COMMUNITY): Payer: Self-pay | Admitting: Emergency Medicine

## 2014-05-06 DIAGNOSIS — M25562 Pain in left knee: Secondary | ICD-10-CM

## 2014-05-06 DIAGNOSIS — E785 Hyperlipidemia, unspecified: Secondary | ICD-10-CM | POA: Insufficient documentation

## 2014-05-06 DIAGNOSIS — N186 End stage renal disease: Secondary | ICD-10-CM | POA: Insufficient documentation

## 2014-05-06 DIAGNOSIS — Z9889 Other specified postprocedural states: Secondary | ICD-10-CM | POA: Insufficient documentation

## 2014-05-06 DIAGNOSIS — I4891 Unspecified atrial fibrillation: Secondary | ICD-10-CM | POA: Insufficient documentation

## 2014-05-06 DIAGNOSIS — M25561 Pain in right knee: Secondary | ICD-10-CM

## 2014-05-06 DIAGNOSIS — Z992 Dependence on renal dialysis: Secondary | ICD-10-CM | POA: Insufficient documentation

## 2014-05-06 DIAGNOSIS — Z7901 Long term (current) use of anticoagulants: Secondary | ICD-10-CM | POA: Insufficient documentation

## 2014-05-06 DIAGNOSIS — K219 Gastro-esophageal reflux disease without esophagitis: Secondary | ICD-10-CM | POA: Insufficient documentation

## 2014-05-06 DIAGNOSIS — M1990 Unspecified osteoarthritis, unspecified site: Secondary | ICD-10-CM

## 2014-05-06 DIAGNOSIS — I120 Hypertensive chronic kidney disease with stage 5 chronic kidney disease or end stage renal disease: Secondary | ICD-10-CM | POA: Insufficient documentation

## 2014-05-06 DIAGNOSIS — IMO0002 Reserved for concepts with insufficient information to code with codable children: Principal | ICD-10-CM

## 2014-05-06 DIAGNOSIS — M1710 Unilateral primary osteoarthritis, unspecified knee: Secondary | ICD-10-CM | POA: Insufficient documentation

## 2014-05-06 DIAGNOSIS — R209 Unspecified disturbances of skin sensation: Secondary | ICD-10-CM | POA: Insufficient documentation

## 2014-05-06 DIAGNOSIS — Z79899 Other long term (current) drug therapy: Secondary | ICD-10-CM | POA: Insufficient documentation

## 2014-05-06 DIAGNOSIS — E669 Obesity, unspecified: Secondary | ICD-10-CM | POA: Insufficient documentation

## 2014-05-06 MED ORDER — IBUPROFEN 800 MG PO TABS: 800.0000 mg | ORAL_TABLET | Freq: Two times a day (BID) | ORAL | Status: DC | PRN

## 2014-05-06 NOTE — ED Notes (Signed)
Pt's symptoms discussed with EDP; no further orders.

## 2014-05-06 NOTE — ED Provider Notes (Signed)
CSN: [id number]     Arrival date & time 05/06/14  1432 History   First MD Initiated Contact with Patient 05/06/14 [place]     Chief Complaint  Patient presents with  . Leg Pain     (Consider location/radiation/quality/duration/timing/severity/associated sxs/prior Treatment) Patient is a 58 y.o. female presenting with leg pain. The history is provided by the patient.  Leg Pain Location:  Knee Time since incident:  1 month Injury: no   Knee location:  L knee and R knee Pain details:    Quality:  Aching   Radiates to:  Does not radiate   Severity:  Moderate   Onset quality:  Gradual   Duration:  1 month   Timing:  Constant   Progression:  Waxing and waning Chronicity:  New Dislocation: no   Foreign body present:  No foreign bodies Prior injury to area:  No Relieved by:  Rest Worsened by:  Bearing weight and activity Ineffective treatments:  Acetaminophen Associated symptoms: decreased ROM   Associated symptoms: no back pain, no fever, no neck pain, no numbness, no stiffness, no swelling and no tingling     Past Medical History  Diagnosis Date  . Hypertension   . A-fib   . History of hysterectomy   . Acid reflux   . Hyperlipidemia   . Chronic kidney failure     pt on dialysis  . Fatigue   . Personal history of long-term (current) use of anticoagulants   . Dyslipidemia   . GERD (gastroesophageal reflux disease)   . Hypokalemia   . Personal history of hypertensive heart disease   . Obesity    Past Surgical History  Procedure Laterality Date  . Partial hysterectomy    . Cardiac catheterization  04/15/2007    Est. EF of 50-55% -- Smooth and normal coronary arteries -- Normal left ventricular systolic function.  We will continue with further treatment of her atrial fibrillation     . Abdominal hysterectomy     Family History  Problem Relation Age of Onset  . Coronary artery disease    . Hypertension     History  Substance Use Topics  . Smoking status: Never  Smoker   . Smokeless tobacco: Not on file  . Alcohol Use: No   OB History   Grav Para Term Preterm Abortions TAB SAB Ect Mult Living                 Review of Systems  Constitutional: Negative for fever and chills.  Respiratory: Negative for cough and shortness of breath.   Musculoskeletal: Negative for back pain, neck pain and stiffness.  All other systems reviewed and are negative.     Allergies  Lipitor  Home Medications   Prior to Admission medications   Medication Sig Start Date End Date Taking? Authorizing Provider  acetaminophen (TYLENOL) 500 MG tablet Take 500 mg by mouth every 6 (six) hours as needed.   Yes Historical Provider, MD  amLODipine (NORVASC) 5 MG tablet Take 1 tablet (5 mg total) by mouth daily. 03/12/14  Yes Thayer Headings, MD  B Complex-C (B-COMPLEX WITH VITAMIN C) tablet Take 1 tablet by mouth daily.   Yes Historical Provider, MD  Cinnamon 500 MG capsule Take 1,000 mg by mouth daily.   Yes Historical Provider, MD  metoprolol succinate (TOPROL-XL) 100 MG 24 hr tablet Take 100 mg by mouth at bedtime. Take with or immediately following a meal.   Yes Historical Provider, MD  omeprazole (PRILOSEC  OTC) 20 MG tablet Take 40 mg by mouth daily.    Yes Historical Provider, MD  rosuvastatin (CRESTOR) 10 MG tablet Take 10 mg by mouth at bedtime.    Yes Historical Provider, MD  warfarin (COUMADIN) 10 MG tablet Take 2.5-5 mg by mouth daily. Takes 5 mg daily except on Fri when patient takes 2.5 mg. 12/06/13  Yes Lendon Colonel, NP  ibuprofen (ADVIL,MOTRIN) 800 MG tablet Take 1 tablet (800 mg total) by mouth every 12 (twelve) hours as needed for moderate pain. 05/06/14   Osvaldo Shipper, MD   BP 142/81  Pulse 74  Temp(Src) 97.8 F (36.6 C) (Oral)  Resp 18  SpO2 100% Physical Exam  Nursing note and vitals reviewed. Constitutional: She is oriented to person, place, and time. She appears well-developed and well-nourished. No distress.  HENT:  Head:  Normocephalic and atraumatic.  Eyes: EOM are normal. Pupils are equal, round, and reactive to light.  Neck: Normal range of motion. Neck supple.  Cardiovascular: Normal rate and regular rhythm.  Exam reveals no friction rub.   No murmur heard. Pulmonary/Chest: Effort normal and breath sounds normal. No respiratory distress. She has no wheezes. She has no rales.  Abdominal: Soft. She exhibits no distension. There is no tenderness. There is no rebound.  Musculoskeletal: Normal range of motion. She exhibits no edema.       Right knee: She exhibits normal range of motion, no swelling, no effusion, no ecchymosis, no deformity, no laceration, no erythema, normal alignment and no LCL laxity. Tenderness (mild, diffuse knee) found.       Left knee: She exhibits normal range of motion, no swelling, no effusion, no ecchymosis, no deformity, no laceration, no erythema, normal alignment and no LCL laxity. Tenderness (mild diffuse) found.  Neurological: She is alert and oriented to person, place, and time.  Skin: She is not diaphoretic.    ED Course  Procedures (including critical care time) Labs Review Labs Reviewed - No data to display  Imaging Review No results found.   EKG Interpretation None      MDM   Final diagnoses:  Bilateral knee pain  Arthritis    62F here with knee pain. No trauma. Alternates between sides, worse with ambulation and bearing weight. Also states some occasional bilateral quadriceps tingling/aching. Patient is obese and on a statin. On exam, full ROM of knee, but painful. She is taking tylenol at home without relief. Without trauma and with worse pain on standing and alternating between sides, her picture is c/w arthritis. No fever or effusion to suggest septic arthritis. No need for xrays at this time. Instructed to do compression, sparingly take NSAIDs. I instructed her to hold her statin until she sees her PCP next week. Patient given small amount of high strength  motrin, instructed to take sparingly as she's HTN at baseline. Stable for discharge.     Osvaldo Shipper, MD 05/06/14 2011

## 2014-05-06 NOTE — Discharge Instructions (Signed)
You have arthritis in your knees. Please take Motrin sparingly for severe pain. Please tell your doctor about your knee pains and that you are taking the motrin.   Arthritis, Nonspecific Arthritis is inflammation of a joint. This usually means pain, redness, warmth or swelling are present. One or more joints may be involved. There are a number of types of arthritis. Your caregiver may not be able to tell what type of arthritis you have right away. CAUSES  The most common cause of arthritis is the wear and tear on the joint (osteoarthritis). This causes damage to the cartilage, which can break down over time. The knees, hips, back and neck are most often affected by this type of arthritis. Other types of arthritis and common causes of joint pain include:  Sprains and other injuries near the joint. Sometimes minor sprains and injuries cause pain and swelling that develop hours later.  Rheumatoid arthritis. This affects hands, feet and knees. It usually affects both sides of your body at the same time. It is often associated with chronic ailments, fever, weight loss and general weakness.  Crystal arthritis. Gout and pseudo gout can cause occasional acute severe pain, redness and swelling in the foot, ankle, or knee.  Infectious arthritis. Bacteria can get into a joint through a break in overlying skin. This can cause infection of the joint. Bacteria and viruses can also spread through the blood and affect your joints.  Drug, infectious and allergy reactions. Sometimes joints can become mildly painful and slightly swollen with these types of illnesses. SYMPTOMS   Pain is the main symptom.  Your joint or joints can also be red, swollen and warm or hot to the touch.  You may have a fever with certain types of arthritis, or even feel overall ill.  The joint with arthritis will hurt with movement. Stiffness is present with some types of arthritis. DIAGNOSIS  Your caregiver will suspect arthritis  based on your description of your symptoms and on your exam. Testing may be needed to find the type of arthritis:  Blood and sometimes urine tests.  X-ray tests and sometimes CT or MRI scans.  Removal of fluid from the joint (arthrocentesis) is done to check for bacteria, crystals or other causes. Your caregiver (or a specialist) will numb the area over the joint with a local anesthetic, and use a needle to remove joint fluid for examination. This procedure is only minimally uncomfortable.  Even with these tests, your caregiver may not be able to tell what kind of arthritis you have. Consultation with a specialist (rheumatologist) may be helpful. TREATMENT  Your caregiver will discuss with you treatment specific to your type of arthritis. If the specific type cannot be determined, then the following general recommendations may apply. Treatment of severe joint pain includes:  Rest.  Elevation.  Anti-inflammatory medication (for example, ibuprofen) may be prescribed. Avoiding activities that cause increased pain.  Only take over-the-counter or prescription medicines for pain and discomfort as recommended by your caregiver.  Cold packs over an inflamed joint may be used for 10 to 15 minutes every hour. Hot packs sometimes feel better, but do not use overnight. Do not use hot packs if you are diabetic without your caregiver's permission.  A cortisone shot into arthritic joints may help reduce pain and swelling.  Any acute arthritis that gets worse over the next 1 to 2 days needs to be looked at to be sure there is no joint infection. Long-term arthritis treatment involves modifying  activities and lifestyle to reduce joint stress jarring. This can include weight loss. Also, exercise is needed to nourish the joint cartilage and remove waste. This helps keep the muscles around the joint strong. HOME CARE INSTRUCTIONS   Do not take aspirin to relieve pain if gout is suspected. This elevates uric  acid levels.  Only take over-the-counter or prescription medicines for pain, discomfort or fever as directed by your caregiver.  Rest the joint as much as possible.  If your joint is swollen, keep it elevated.  Use crutches if the painful joint is in your leg.  Drinking plenty of fluids may help for certain types of arthritis.  Follow your caregiver's dietary instructions.  Try low-impact exercise such as:  Swimming.  Water aerobics.  Biking.  Walking.  Morning stiffness is often relieved by a warm shower.  Put your joints through regular range-of-motion. SEEK MEDICAL CARE IF:   You do not feel better in 24 hours or are getting worse.  You have side effects to medications, or are not getting better with treatment. SEEK IMMEDIATE MEDICAL CARE IF:   You have a fever.  You develop severe joint pain, swelling or redness.  Many joints are involved and become painful and swollen.  There is severe back pain and/or leg weakness.  You have loss of bowel or bladder control. Document Released: 01/18/2005 Document Revised: 03/04/2012 Document Reviewed: 02/03/2009 [facility] Patient Information 2014 [place].

## 2014-05-06 NOTE — ED Notes (Addendum)
Bilateral leg pain x several weeks; pain is in knees and back of leg; also reports numbness. This morning other leg started hurting when she was exercising. Walking makes it worse.

## 2014-05-08 ENCOUNTER — Ambulatory Visit (INDEPENDENT_AMBULATORY_CARE_PROVIDER_SITE_OTHER): Payer: No Typology Code available for payment source | Admitting: *Deleted

## 2014-05-08 DIAGNOSIS — Z5181 Encounter for therapeutic drug level monitoring: Secondary | ICD-10-CM

## 2014-05-08 DIAGNOSIS — I4891 Unspecified atrial fibrillation: Secondary | ICD-10-CM

## 2014-05-08 DIAGNOSIS — Z7901 Long term (current) use of anticoagulants: Secondary | ICD-10-CM

## 2014-05-08 LAB — POCT INR: INR: 3.3

## 2014-05-15 ENCOUNTER — Ambulatory Visit
Admission: RE | Admit: 2014-05-15 | Discharge: 2014-05-15 | Disposition: A | Discharge status: Home / Self Care | Payer: No Typology Code available for payment source | Source: Ambulatory Visit | Attending: Physician Assistant | Admitting: Physician Assistant

## 2014-05-15 ENCOUNTER — Other Ambulatory Visit: Payer: Self-pay | Admitting: Physician Assistant

## 2014-05-15 DIAGNOSIS — R52 Pain, unspecified: Secondary | ICD-10-CM

## 2014-05-29 ENCOUNTER — Ambulatory Visit (INDEPENDENT_AMBULATORY_CARE_PROVIDER_SITE_OTHER): Payer: No Typology Code available for payment source

## 2014-05-29 DIAGNOSIS — Z7901 Long term (current) use of anticoagulants: Secondary | ICD-10-CM

## 2014-05-29 DIAGNOSIS — I4891 Unspecified atrial fibrillation: Secondary | ICD-10-CM

## 2014-05-29 DIAGNOSIS — Z5181 Encounter for therapeutic drug level monitoring: Secondary | ICD-10-CM

## 2014-05-29 LAB — POCT INR: INR: 3.6

## 2014-06-12 ENCOUNTER — Ambulatory Visit (INDEPENDENT_AMBULATORY_CARE_PROVIDER_SITE_OTHER): Payer: No Typology Code available for payment source | Admitting: *Deleted

## 2014-06-12 DIAGNOSIS — I4891 Unspecified atrial fibrillation: Secondary | ICD-10-CM

## 2014-06-12 DIAGNOSIS — Z7901 Long term (current) use of anticoagulants: Secondary | ICD-10-CM

## 2014-06-12 DIAGNOSIS — Z5181 Encounter for therapeutic drug level monitoring: Secondary | ICD-10-CM

## 2014-06-12 LAB — POCT INR: INR: 1.5

## 2014-06-24 ENCOUNTER — Ambulatory Visit (INDEPENDENT_AMBULATORY_CARE_PROVIDER_SITE_OTHER): Payer: No Typology Code available for payment source | Admitting: *Deleted

## 2014-06-24 DIAGNOSIS — Z5181 Encounter for therapeutic drug level monitoring: Secondary | ICD-10-CM

## 2014-06-24 DIAGNOSIS — I4891 Unspecified atrial fibrillation: Secondary | ICD-10-CM

## 2014-06-24 DIAGNOSIS — Z7901 Long term (current) use of anticoagulants: Secondary | ICD-10-CM

## 2014-06-24 LAB — POCT INR: INR: 1.9

## 2014-07-08 ENCOUNTER — Ambulatory Visit (INDEPENDENT_AMBULATORY_CARE_PROVIDER_SITE_OTHER): Payer: No Typology Code available for payment source | Admitting: Pharmacist

## 2014-07-08 DIAGNOSIS — Z7901 Long term (current) use of anticoagulants: Secondary | ICD-10-CM

## 2014-07-08 DIAGNOSIS — Z5181 Encounter for therapeutic drug level monitoring: Secondary | ICD-10-CM

## 2014-07-08 DIAGNOSIS — I4891 Unspecified atrial fibrillation: Secondary | ICD-10-CM

## 2014-07-08 LAB — POCT INR: INR: 2.5

## 2014-07-30 ENCOUNTER — Ambulatory Visit (INDEPENDENT_AMBULATORY_CARE_PROVIDER_SITE_OTHER): Payer: No Typology Code available for payment source

## 2014-07-30 DIAGNOSIS — Z5181 Encounter for therapeutic drug level monitoring: Secondary | ICD-10-CM

## 2014-07-30 DIAGNOSIS — I4891 Unspecified atrial fibrillation: Secondary | ICD-10-CM

## 2014-07-30 DIAGNOSIS — Z7901 Long term (current) use of anticoagulants: Secondary | ICD-10-CM

## 2014-07-30 LAB — POCT INR: INR: 2.7

## 2014-08-27 ENCOUNTER — Ambulatory Visit (INDEPENDENT_AMBULATORY_CARE_PROVIDER_SITE_OTHER): Payer: No Typology Code available for payment source

## 2014-08-27 DIAGNOSIS — Z7901 Long term (current) use of anticoagulants: Secondary | ICD-10-CM

## 2014-08-27 DIAGNOSIS — Z5181 Encounter for therapeutic drug level monitoring: Secondary | ICD-10-CM

## 2014-08-27 DIAGNOSIS — I4891 Unspecified atrial fibrillation: Secondary | ICD-10-CM

## 2014-08-27 LAB — POCT INR: INR: 1.9

## 2014-09-09 ENCOUNTER — Encounter: Payer: Self-pay | Admitting: Cardiovascular Disease

## 2014-09-09 ENCOUNTER — Ambulatory Visit (INDEPENDENT_AMBULATORY_CARE_PROVIDER_SITE_OTHER): Payer: No Typology Code available for payment source | Admitting: Cardiovascular Disease

## 2014-09-09 VITALS — BP 140/86 | HR 70 | Ht 69.0 in | Wt 255.2 lb

## 2014-09-09 DIAGNOSIS — I10 Essential (primary) hypertension: Secondary | ICD-10-CM

## 2014-09-09 DIAGNOSIS — I4891 Unspecified atrial fibrillation: Secondary | ICD-10-CM

## 2014-09-09 MED ORDER — AMLODIPINE BESYLATE 5 MG PO TABS: 5.0000 mg | ORAL_TABLET | Freq: Every day | ORAL | Status: DC

## 2014-09-09 NOTE — Assessment & Plan Note (Signed)
BP is still elevated.  She is still eating salt.  Advised her to avoid salt  Needs to exercise - suggest water aerobics.

## 2014-09-09 NOTE — Patient Instructions (Signed)
Your physician recommends that you continue on your current medications as directed. Please refer to the Current Medication list given to you today.  Your physician wants you to follow-up in: 1 year with Dr. Nahser.  You will receive a reminder letter in the mail two months in advance. If you don't receive a letter, please call our office to schedule the follow-up appointment.  

## 2014-09-09 NOTE — Assessment & Plan Note (Signed)
Clinically she still in normal sinus rhythm. Doing well Continue same meds

## 2014-09-09 NOTE — Progress Notes (Signed)
[address] [place], [place]  [address] Phone: [phone] Fax:  [phone]  Date:  09/09/2014   ID:  Jacqueline Petersen, Jacqueline Petersen [birth year], MRN [id number]  PCP:  [facility].  Problem list: 1. Hypertension 2. Hyperlipidemia 3. Paroxysmal atrial fibrillation 4. Obesity 5. Obstructive sleep apnea     History of Present Illness: Jacqueline Petersen is a 58 y.o. female with a hx of HTN, HL, paroxysmal atrial fibrillation previously on Coumadin, GERD and obesity. LHC (03/2007): Normal coronary arteries, EF 50-55%. Patient was recently admitted 12/10-12/13 after presenting with palpitations. She was found to be in atrial fibrillation with RVR. She converted to NSR on IV diltiazem. It was felt that she needed long-term anticoagulation. CHADS2-VASc=2.  She was placed on Coumadin. Echocardiogram (12/04/2013): Moderate LVH, EF 50-55%, normal wall motion.  She has generally been doing okay since discharge. She feels fatigued. She's had occasional palpitations. These are fairly consistent with what she has had in the past. She denies any prolonged rapid palpitations. She denies chest pain, dyspnea, syncope, orthopnea, PND or edema. She is compliant with her CPAP.   March 12, 2014: Jacqueline Petersen has a hx of AF in the remote past.   She has normal coronaries.  She was admitted with Afib- and converted with dilt drip.  She continues to have problems with HTN.  She complains about pain and firmness in her left leg.  She has continued to gain weight He has a history of sleep apnea. She does not look like wearing her CPAP mask because it is uncomfortable at night.  Sept. 16, 2015:  Pt is doing well.  I have met her in the past.  She was seen by [name] in March for follow up for her a-fib.  Still eating salt.- diastolic BP is still elevated.  Unable to exercise due to leg pain    Wt Readings from Last 3 Encounters:  09/09/14 255 lb 3.2 oz (115.758 kg)   03/12/14 254 lb 12.8 oz (115.577 kg)  12/16/13 252 lb (114.306 kg)     Past Medical History  Diagnosis Date  . Hypertension   . A-fib   . History of hysterectomy   . Acid reflux   . Hyperlipidemia   . Chronic kidney failure     pt on dialysis  . Fatigue   . Personal history of long-term (current) use of anticoagulants   . Dyslipidemia   . GERD (gastroesophageal reflux disease)   . Hypokalemia   . Personal history of hypertensive heart disease   . Obesity     Current Outpatient Prescriptions  Medication Sig Dispense Refill  . acetaminophen (TYLENOL) 500 MG tablet Take 500 mg by mouth every 6 (six) hours as needed.      Marland Kitchen amLODipine (NORVASC) 5 MG tablet Take 1 tablet (5 mg total) by mouth daily.  180 tablet  1  . B Complex-C (B-COMPLEX WITH VITAMIN C) tablet Take 1 tablet by mouth daily.      . Cinnamon 500 MG capsule Take 1,000 mg by mouth daily.      Marland Kitchen ibuprofen (ADVIL,MOTRIN) 800 MG tablet Take 1 tablet (800 mg total) by mouth every 12 (twelve) hours as needed for moderate pain.  15 tablet  0  . metoprolol succinate (TOPROL-XL) 100 MG 24 hr tablet Take 100 mg by mouth at bedtime. Take with or immediately following a meal.      . omeprazole (PRILOSEC OTC) 20 MG tablet Take 40 mg  by mouth daily.       . rosuvastatin (CRESTOR) 10 MG tablet Take 10 mg by mouth at bedtime.       Marland Kitchen warfarin (COUMADIN) 10 MG tablet Take 2.5-5 mg by mouth daily. Takes 5 mg daily except on Fri when patient takes 2.5 mg.       No current facility-administered medications for this visit.    Allergies:   Lipitor   Social History:  The patient  reports that she has never smoked. She does not have any smokeless tobacco history on file. She reports that she does not drink alcohol or use illicit drugs.   Family History:  The patient's family history includes Coronary artery disease in an other family member; Hypertension in an other family member.   ROS:  Please see the history of present illness.    She denies any bleeding problems. She has had some recent URI symptoms. She denies fever.   All other systems reviewed and negative.   PHYSICAL EXAM: VS:  BP 140/86  Pulse 70  Ht 5' 9"  (1.753 m)  Wt 255 lb 3.2 oz (115.758 kg)  BMI 37.67 kg/m2 Well nourished, well developed, in no acute distress HEENT: normal Neck: no JVD Cardiac:  normal S1, S2; RRR; no murmur Lungs:  clear to auscultation bilaterally, no wheezing, rhonchi or rales Abd: soft, nontender, no hepatomegaly Ext: no edema Skin: warm and dry Neuro:  CNs 2-12 intact, no focal abnormalities noted  EKG:  NSR, HR 74, LAD, nonspecific ST-T wave changes, no change from prior tracing     ASSESSMENT AND PLAN:

## 2014-09-24 ENCOUNTER — Ambulatory Visit (INDEPENDENT_AMBULATORY_CARE_PROVIDER_SITE_OTHER)
Payer: No Typology Code available for payment source | Admitting: Pharmacist Clinician (PhC)/ Clinical Pharmacy Specialist

## 2014-09-24 DIAGNOSIS — I4891 Unspecified atrial fibrillation: Secondary | ICD-10-CM

## 2014-09-24 DIAGNOSIS — Z7901 Long term (current) use of anticoagulants: Secondary | ICD-10-CM

## 2014-09-24 DIAGNOSIS — Z5181 Encounter for therapeutic drug level monitoring: Secondary | ICD-10-CM

## 2014-09-24 LAB — POCT INR: INR: 3.5

## 2014-09-24 MED ORDER — WARFARIN SODIUM 5 MG PO TABS: 5.0000 mg | ORAL_TABLET | Freq: Every day | ORAL | Status: DC

## 2014-10-17 ENCOUNTER — Emergency Department (HOSPITAL_COMMUNITY)
Admission: EM | Admit: 2014-10-17 | Discharge: 2014-10-17 | Disposition: A | Discharge status: Home / Self Care | Payer: No Typology Code available for payment source | Attending: Emergency Medicine | Admitting: Emergency Medicine

## 2014-10-17 ENCOUNTER — Emergency Department (HOSPITAL_COMMUNITY): Payer: No Typology Code available for payment source

## 2014-10-17 ENCOUNTER — Encounter (HOSPITAL_COMMUNITY): Payer: Self-pay | Admitting: Emergency Medicine

## 2014-10-17 DIAGNOSIS — Z9889 Other specified postprocedural states: Secondary | ICD-10-CM | POA: Insufficient documentation

## 2014-10-17 DIAGNOSIS — Z7901 Long term (current) use of anticoagulants: Secondary | ICD-10-CM | POA: Insufficient documentation

## 2014-10-17 DIAGNOSIS — I119 Hypertensive heart disease without heart failure: Secondary | ICD-10-CM | POA: Insufficient documentation

## 2014-10-17 DIAGNOSIS — R42 Dizziness and giddiness: Secondary | ICD-10-CM | POA: Insufficient documentation

## 2014-10-17 DIAGNOSIS — Z79899 Other long term (current) drug therapy: Secondary | ICD-10-CM | POA: Insufficient documentation

## 2014-10-17 DIAGNOSIS — H539 Unspecified visual disturbance: Secondary | ICD-10-CM | POA: Insufficient documentation

## 2014-10-17 DIAGNOSIS — E785 Hyperlipidemia, unspecified: Secondary | ICD-10-CM | POA: Insufficient documentation

## 2014-10-17 DIAGNOSIS — K219 Gastro-esophageal reflux disease without esophagitis: Secondary | ICD-10-CM | POA: Insufficient documentation

## 2014-10-17 DIAGNOSIS — I4891 Unspecified atrial fibrillation: Secondary | ICD-10-CM | POA: Insufficient documentation

## 2014-10-17 DIAGNOSIS — E669 Obesity, unspecified: Secondary | ICD-10-CM | POA: Insufficient documentation

## 2014-10-17 DIAGNOSIS — R51 Headache: Secondary | ICD-10-CM | POA: Insufficient documentation

## 2014-10-17 LAB — URINALYSIS, ROUTINE W REFLEX MICROSCOPIC
Bilirubin Urine: NEGATIVE
Glucose, UA: NEGATIVE mg/dL
Ketones, ur: NEGATIVE mg/dL
LEUKOCYTES UA: NEGATIVE
Nitrite: NEGATIVE
PROTEIN: NEGATIVE mg/dL
Specific Gravity, Urine: 1.015 (ref 1.005–1.030)
Urobilinogen, UA: 0.2 mg/dL (ref 0.0–1.0)
pH: 6.5 (ref 5.0–8.0)

## 2014-10-17 LAB — CBC
HCT: 40.5 % (ref 36.0–46.0)
Hemoglobin: 13.4 g/dL (ref 12.0–15.0)
MCH: 27.6 pg (ref 26.0–34.0)
MCHC: 33.1 g/dL (ref 30.0–36.0)
MCV: 83.5 fL (ref 78.0–100.0)
Platelets: 329 10*3/uL (ref 150–400)
RBC: 4.85 MIL/uL (ref 3.87–5.11)
RDW: 15 % (ref 11.5–15.5)
WBC: 9.1 10*3/uL (ref 4.0–10.5)

## 2014-10-17 LAB — URINE MICROSCOPIC-ADD ON

## 2014-10-17 LAB — BASIC METABOLIC PANEL
Anion gap: 12 (ref 5–15)
BUN: 12 mg/dL (ref 6–23)
CALCIUM: 9 mg/dL (ref 8.4–10.5)
CO2: 26 mEq/L (ref 19–32)
Chloride: 103 mEq/L (ref 96–112)
Creatinine, Ser: 0.77 mg/dL (ref 0.50–1.10)
Glucose, Bld: 106 mg/dL — ABNORMAL HIGH (ref 70–99)
POTASSIUM: 3.7 meq/L (ref 3.7–5.3)
SODIUM: 141 meq/L (ref 137–147)

## 2014-10-17 LAB — TROPONIN I: Troponin I: <0.3 ng/mL (ref <0.30)

## 2014-10-17 MED ORDER — SODIUM CHLORIDE 0.9 % IV BOLUS (SEPSIS)
500.0000 mL | Freq: Once | INTRAVENOUS | Status: AC
  Administered 2014-10-17: 500 mL via INTRAVENOUS

## 2014-10-17 NOTE — ED Notes (Signed)
Patient transported to X-ray 

## 2014-10-17 NOTE — ED Notes (Signed)
She reports she was lying in the recliner resting last night then she got up to go to bed and felt very dizzy. She states she felt she might pass out but did not. She states she has chronic arthritis pain all over her body. She reports the dizziness is better now. She is A&ox4, breathing easily.

## 2014-10-17 NOTE — Discharge Instructions (Signed)

## 2014-10-17 NOTE — ED Provider Notes (Signed)
CSN: [id number]     Arrival date & time 10/17/14  1150 History   First MD Initiated Contact with Patient 10/17/14 1240     Chief Complaint  Patient presents with  . Dizziness     (Consider location/radiation/quality/duration/timing/severity/associated sxs/prior Treatment) Patient is a 58 y.o. female presenting with dizziness. The history is provided by the patient.  Dizziness Associated symptoms: headaches   Associated symptoms: no chest pain, no diarrhea, no nausea, no shortness of breath and no vomiting   patient with dizziness. States that she felt as if her legs were weak. States it began last night when she was sitting in a chair. States she got up to go to bed and her legs felt weak. States she also has a dull left-sided headache.states that she had been following to the left. States she has had occasional double vision. States she felt like she is given a pass out both of her legs were feeling weak. No confusion.  Past Medical History  Diagnosis Date  . Hypertension   . A-fib   . History of hysterectomy   . Acid reflux   . Hyperlipidemia   . Fatigue   . Personal history of long-term (current) use of anticoagulants   . Dyslipidemia   . GERD (gastroesophageal reflux disease)   . Hypokalemia   . Personal history of hypertensive heart disease   . Obesity    Past Surgical History  Procedure Laterality Date  . Partial hysterectomy    . Cardiac catheterization  04/15/2007    Est. EF of 50-55% -- Smooth and normal coronary arteries -- Normal left ventricular systolic function.  We will continue with further treatment of her atrial fibrillation     . Abdominal hysterectomy     Family History  Problem Relation Age of Onset  . Coronary artery disease    . Hypertension     History  Substance Use Topics  . Smoking status: Never Smoker   . Smokeless tobacco: Not on file  . Alcohol Use: No   OB History   Grav Para Term Preterm Abortions TAB SAB Ect Mult Living                  Review of Systems  Constitutional: Negative for activity change and appetite change.  Eyes: Positive for visual disturbance. Negative for pain.  Respiratory: Negative for chest tightness and shortness of breath.   Cardiovascular: Negative for chest pain and leg swelling.  Gastrointestinal: Negative for nausea, vomiting, abdominal pain and diarrhea.  Genitourinary: Negative for flank pain.  Musculoskeletal: Negative for back pain and neck stiffness.  Skin: Negative for rash.  Neurological: Positive for dizziness, light-headedness and headaches. Negative for speech difficulty, weakness and numbness.  Psychiatric/Behavioral: Negative for behavioral problems.      Allergies  Lipitor  Home Medications   Prior to Admission medications   Medication Sig Start Date End Date Taking? Authorizing Provider  acetaminophen (TYLENOL) 500 MG tablet Take 500 mg by mouth every 6 (six) hours as needed (for pain).    Yes Historical Provider, MD  amLODipine (NORVASC) 5 MG tablet Take 1 tablet (5 mg total) by mouth daily. 09/09/14  Yes Thayer Headings, MD  B Complex-C (B-COMPLEX WITH VITAMIN C) tablet Take 1 tablet by mouth daily.   Yes Historical Provider, MD  Cholecalciferol (VITAMIN D) 2000 UNITS CAPS Take 2,000 Units by mouth every evening.   Yes Historical Provider, MD  Cinnamon 500 MG capsule Take 500 mg by mouth daily.  Yes Historical Provider, MD  metoprolol succinate (TOPROL-XL) 100 MG 24 hr tablet Take 100 mg by mouth at bedtime. Take with or immediately following a meal.   Yes Historical Provider, MD  omeprazole (PRILOSEC) 20 MG capsule Take 40 mg by mouth daily.   Yes Historical Provider, MD  oxyCODONE-acetaminophen (PERCOCET) 10-325 MG per tablet Take 1 tablet by mouth every 6 (six) hours as needed for pain.   Yes Historical Provider, MD  rosuvastatin (CRESTOR) 10 MG tablet Take 10 mg by mouth at bedtime.    Yes Historical Provider, MD  warfarin (COUMADIN) 5 MG tablet Take 1.25-5 mg by  mouth daily. Take 5 mg daily except take 1.25 mg (1/4 tablet) on fridays   Yes Historical Provider, MD   BP 132/65  Pulse 73  Temp(Src) 98.8 F (37.1 C) (Oral)  Resp 17  Ht 5\' 3"  (1.6 m)  Wt 255 lb (115.667 kg)  BMI 45.18 kg/m2  SpO2 96% Physical Exam  Nursing note and vitals reviewed. Constitutional: She is oriented to person, place, and time. She appears well-developed and well-nourished.  HENT:  Head: Normocephalic and atraumatic.  Eyes: EOM are normal. Pupils are equal, round, and reactive to light.  Neck: Normal range of motion. Neck supple.  Cardiovascular: Normal rate, regular rhythm and normal heart sounds.   No murmur heard. Pulmonary/Chest: Effort normal and breath sounds normal. No respiratory distress. She has no wheezes. She has no rales.  Abdominal: Soft. Bowel sounds are normal. She exhibits no distension. There is no tenderness. There is no rebound and no guarding.  Musculoskeletal: Normal range of motion.  Neurological: She is alert and oriented to person, place, and time. No cranial nerve deficit.  Finger-nose intact bilaterally. External ocular movements intact. Face is symmetric. Good grip strength bilaterally. Heel-to-shin intact bilaterally. Normal gait. Good straight leg raise bilaterally. Negative Romberg.  Skin: Skin is warm and dry.  Psychiatric: She has a normal mood and affect. Her speech is normal.    ED Course  Procedures (including critical care time) Labs Review Labs Reviewed  BASIC METABOLIC PANEL - Abnormal; Notable for the following:    Glucose, Bld 106 (*)    All other components within normal limits  URINALYSIS, ROUTINE W REFLEX MICROSCOPIC - Abnormal; Notable for the following:    Hgb urine dipstick SMALL (*)    All other components within normal limits  CBC  TROPONIN I  URINE MICROSCOPIC-ADD ON    Imaging Review Dg Chest 2 View  10/17/2014   CLINICAL DATA:  Chronic kidney disease on dialysis, lightheadedness, epigastric heaviness,  personal history of hypertension, atrial fibrillation, GERD, hypertensive heart disease  EXAM: CHEST  2 VIEW  COMPARISON:  12/03/2013  FINDINGS: Upper normal heart size.  Normal mediastinal contours and pulmonary vascularity.  Mild elevation of RIGHT diaphragm.  Lungs clear.  No pleural effusion or pneumothorax.  Bones unremarkable.  IMPRESSION: No acute abnormalities.   Electronically Signed   By: Lavonia Dana M.D.   On: 10/17/2014 14:39     EKG Interpretation   Date/Time:  Saturday October 17 2014 13:02:29 EDT Ventricular Rate:  68 PR Interval:  140 QRS Duration: 88 QT Interval:  417 QTC Calculation: 443 R Axis:   -37 Text Interpretation:  Sinus rhythm Left axis deviation Borderline T  abnormalities, inferior leads No significant change since last tracing  Confirmed by Alvino Chapel  MD, Ovid Curd [phone]) on 10/17/2014 2:55:34 PM      MDM   Final diagnoses:  Lightheadedness  Patient with lightheadedness. Began after sleeping in a chair. Nonfocal neurologic exam. patient's symptoms are resolved after 500 mL bolus. I doubt acute CVA at this time. I would not expect resolutions of his symptoms with fluid bolus. She does have risk factors in that she has A. Fib on anticoagulation. Patient feels better will be discharged home and follow-up as needed    Jasper Riling. Alvino Chapel, MD 10/17/14 [phone]

## 2014-11-05 ENCOUNTER — Ambulatory Visit (INDEPENDENT_AMBULATORY_CARE_PROVIDER_SITE_OTHER)
Payer: No Typology Code available for payment source | Admitting: Pharmacist Clinician (PhC)/ Clinical Pharmacy Specialist

## 2014-11-05 DIAGNOSIS — I4891 Unspecified atrial fibrillation: Secondary | ICD-10-CM

## 2014-11-05 DIAGNOSIS — Z5181 Encounter for therapeutic drug level monitoring: Secondary | ICD-10-CM

## 2014-11-05 DIAGNOSIS — Z7901 Long term (current) use of anticoagulants: Secondary | ICD-10-CM

## 2014-11-05 LAB — POCT INR: INR: 3.6

## 2014-11-16 ENCOUNTER — Ambulatory Visit (INDEPENDENT_AMBULATORY_CARE_PROVIDER_SITE_OTHER): Payer: No Typology Code available for payment source | Admitting: *Deleted

## 2014-11-16 VITALS — BP 149/82 | HR 76 | Resp 20 | Wt 254.0 lb

## 2014-11-16 DIAGNOSIS — I480 Paroxysmal atrial fibrillation: Secondary | ICD-10-CM

## 2014-11-16 NOTE — Progress Notes (Signed)
Patient walked in to clinic complaining of SOB with ambulation and palpitations. No complaints of chest pain. States she saw her PCP about 1 week ago and she advised her to see cardiology because of the above complaints. She states they tried to complete an EKG but the machine was not working correctly. EKG done in clinic today shows NSR with occasional PVC's. HR 76. Discussed with Dr.Nishan (DOD) and he advised that she take her Norvasc in the AM and Metoprolol in the PM and follow up with Dr.Nahser  Tomorrow.

## 2014-11-16 NOTE — Patient Instructions (Signed)
Please take the Amlodipine 5 mg in the AM anf the Metoprolol XL 100 mg in the PM. Return to see Dr.Nahser on 11/24 at 430 PM.

## 2014-11-17 ENCOUNTER — Encounter: Payer: Self-pay | Admitting: Cardiovascular Disease

## 2014-11-17 ENCOUNTER — Ambulatory Visit (INDEPENDENT_AMBULATORY_CARE_PROVIDER_SITE_OTHER): Payer: No Typology Code available for payment source | Admitting: Cardiovascular Disease

## 2014-11-17 VITALS — BP 130/90 | HR 83 | Ht 63.0 in | Wt 253.8 lb

## 2014-11-17 DIAGNOSIS — I4891 Unspecified atrial fibrillation: Secondary | ICD-10-CM

## 2014-11-17 DIAGNOSIS — E785 Hyperlipidemia, unspecified: Secondary | ICD-10-CM

## 2014-11-17 DIAGNOSIS — I10 Essential (primary) hypertension: Secondary | ICD-10-CM

## 2014-11-17 NOTE — Patient Instructions (Addendum)
Your physician recommends that you continue on your current medications as directed. Please refer to the Current Medication list given to you today.  Your physician has advised you increase the intake of potassium in your diet:  V8 juice, orange juice, bananas, molasses, tomatoes, potatoes, leafy greens  Your physician wants you to follow-up in: 6 months with Dr. Acie Fredrickson.  You will receive a reminder letter in the mail two months in advance. If you don't receive a letter, please call our office to schedule the follow-up appointment. Your physician recommends that you return for lab work in: 6 months on the day of or a few days before your office visit with Dr. Acie Fredrickson.  You will need to FAST for this appointment - nothing to eat or drink after midnight the night before except water.

## 2014-11-17 NOTE — Assessment & Plan Note (Signed)
Jacqueline Petersen is doing ok BP is better.  Advised her to continue to work on diet / exercise,

## 2014-11-17 NOTE — Assessment & Plan Note (Signed)
The patient is doing well. She is maintaining sinus rhythm. Continue current dose of metoprolol. Continue Coumadin.  She is followed in our coumadin clinic.  Will see her in 6 months.

## 2014-11-17 NOTE — Progress Notes (Signed)
[address] [place], [place]  [address] Phone: [phone] Fax:  [phone]  Date:  11/17/2014   ID:  Jacqueline Petersen, Jacqueline Petersen 11-20-1956, MRN [id number]  PCP:  [facility].  Problem list: 1. Hypertension 2. Hyperlipidemia 3. Paroxysmal atrial fibrillation 4. Obesity 5. Obstructive sleep apnea     History of Present Illness: Jacqueline Petersen is a 59 y.o. female with a hx of HTN, HL, paroxysmal atrial fibrillation previously on Coumadin, GERD and obesity. LHC (03/2007): Normal coronary arteries, EF 50-55%. Patient was recently admitted 12/10-12/13 after presenting with palpitations. She was found to be in atrial fibrillation with RVR. She converted to NSR on IV diltiazem. It was felt that she needed long-term anticoagulation. CHADS2-VASc=2.  She was placed on Coumadin. Echocardiogram (12/04/2013): Moderate LVH, EF 50-55%, normal wall motion.  She has generally been doing okay since discharge. She feels fatigued. She's had occasional palpitations. These are fairly consistent with what she has had in the past. She denies any prolonged rapid palpitations. She denies chest pain, dyspnea, syncope, orthopnea, PND or edema. She is compliant with her CPAP.   March 12, 2014: Jacqueline Petersen has a hx of AF in the remote past.   She has normal coronaries.  She was admitted with Afib- and converted with dilt drip.  She continues to have problems with HTN.  She complains about pain and firmness in her left leg.  She has continued to gain weight He has a history of sleep apnea. She does not look like wearing her CPAP mask because it is uncomfortable at night.  Sept. 16, 2015:  Jacqueline Petersen is doing well.  I have met her in the past.  She was seen by Jacqueline Petersen in March for follow up for her a-fib.  Still eating salt.- diastolic BP is still elevated.  Unable to exercise due to leg pain  Nov. 24, 2015:  Jacqueline Petersen is seen back today for follow up of her HTN. We  added Amlodipine at her last visit.  She has not had any further episodes of atrial fib. She has lots of pain in her knees.  Is scheduled to see an orthopedist soon.   Wt Readings from Last 3 Encounters:  11/17/14 253 lb 12.8 oz (115.123 kg)  11/16/14 254 lb (115.214 kg)  10/17/14 255 lb (115.667 kg)     Past Medical History  Diagnosis Date  . Hypertension   . A-fib   . History of hysterectomy   . Acid reflux   . Hyperlipidemia   . Fatigue   . Personal history of long-term (current) use of anticoagulants   . Dyslipidemia   . GERD (gastroesophageal reflux disease)   . Hypokalemia   . Personal history of hypertensive heart disease   . Obesity     Current Outpatient Prescriptions  Medication Sig Dispense Refill  . acetaminophen (TYLENOL) 500 MG tablet Take 500 mg by mouth every 6 (six) hours as needed (for pain).     Marland Kitchen amLODipine (NORVASC) 5 MG tablet Take 1 tablet (5 mg total) by mouth daily. 90 tablet 3  . B Complex-C (B-COMPLEX WITH VITAMIN C) tablet Take 1 tablet by mouth daily.    . Cholecalciferol (VITAMIN D) 2000 UNITS CAPS Take 2,000 Units by mouth every evening.    . Cinnamon 500 MG capsule Take 500 mg by mouth daily.     . metoprolol succinate (TOPROL-XL) 100 MG 24 hr tablet Take 100 mg by mouth at bedtime. Take with  or immediately following a meal.    . omeprazole (PRILOSEC) 20 MG capsule Take 40 mg by mouth daily.    . rosuvastatin (CRESTOR) 10 MG tablet Take 10 mg by mouth at bedtime.     Marland Kitchen warfarin (COUMADIN) 5 MG tablet Take 1.25-5 mg by mouth daily. Take 5 mg daily except take 1.25 mg (1/4 tablet) on fridays     No current facility-administered medications for this visit.    Allergies:   Lipitor   Social History:  The patient  reports that she has never smoked. She does not have any smokeless tobacco history on file. She reports that she does not drink alcohol or use illicit drugs.   Family History:  The patient's family history includes Coronary artery  disease in an other family member; Hypertension in an other family member.   ROS:  Please see the history of present illness.   She denies any bleeding problems. She has had some recent URI symptoms. She denies fever.   All other systems reviewed and negative.   PHYSICAL EXAM: VS:  BP 130/90 mmHg  Pulse 83  Ht 5' 3"  (1.6 m)  Wt 253 lb 12.8 oz (115.123 kg)  BMI 44.97 kg/m2 Well nourished, well developed, in no acute distress HEENT: normal Neck: no JVD Cardiac:  normal S1, S2; RRR; no murmur Lungs:  clear to auscultation bilaterally, no wheezing, rhonchi or rales Abd: soft, nontender, no hepatomegaly Ext: no edema Skin: warm and dry Neuro:  CNs 2-12 intact, no focal abnormalities noted  EKG:  Nov 17, 2014: Normal sinus rhythm with occasional premature ventricular contractions.    ASSESSMENT AND PLAN:

## 2014-11-17 NOTE — Assessment & Plan Note (Addendum)
continnue diet and exercise.  Continue crestor.  Will check fasting labs in 6 months.

## 2014-11-26 ENCOUNTER — Ambulatory Visit (INDEPENDENT_AMBULATORY_CARE_PROVIDER_SITE_OTHER): Payer: No Typology Code available for payment source | Admitting: *Deleted

## 2014-11-26 DIAGNOSIS — Z7901 Long term (current) use of anticoagulants: Secondary | ICD-10-CM

## 2014-11-26 DIAGNOSIS — Z5181 Encounter for therapeutic drug level monitoring: Secondary | ICD-10-CM

## 2014-11-26 DIAGNOSIS — I4891 Unspecified atrial fibrillation: Secondary | ICD-10-CM

## 2014-11-26 LAB — POCT INR: INR: 2.8

## 2014-12-02 ENCOUNTER — Emergency Department (HOSPITAL_COMMUNITY)
Admission: EM | Admit: 2014-12-02 | Discharge: 2014-12-02 | Disposition: A | Discharge status: Home / Self Care | Payer: No Typology Code available for payment source | Attending: Emergency Medicine | Admitting: Emergency Medicine

## 2014-12-02 ENCOUNTER — Encounter (HOSPITAL_COMMUNITY): Payer: Self-pay | Admitting: Emergency Medicine

## 2014-12-02 DIAGNOSIS — Z8739 Personal history of other diseases of the musculoskeletal system and connective tissue: Secondary | ICD-10-CM | POA: Insufficient documentation

## 2014-12-02 DIAGNOSIS — E669 Obesity, unspecified: Secondary | ICD-10-CM | POA: Insufficient documentation

## 2014-12-02 DIAGNOSIS — E785 Hyperlipidemia, unspecified: Secondary | ICD-10-CM | POA: Insufficient documentation

## 2014-12-02 DIAGNOSIS — K219 Gastro-esophageal reflux disease without esophagitis: Secondary | ICD-10-CM | POA: Insufficient documentation

## 2014-12-02 DIAGNOSIS — Z7901 Long term (current) use of anticoagulants: Secondary | ICD-10-CM | POA: Insufficient documentation

## 2014-12-02 DIAGNOSIS — Z9889 Other specified postprocedural states: Secondary | ICD-10-CM | POA: Insufficient documentation

## 2014-12-02 DIAGNOSIS — R0602 Shortness of breath: Secondary | ICD-10-CM | POA: Insufficient documentation

## 2014-12-02 DIAGNOSIS — Z90710 Acquired absence of both cervix and uterus: Secondary | ICD-10-CM | POA: Insufficient documentation

## 2014-12-02 DIAGNOSIS — R51 Headache: Secondary | ICD-10-CM | POA: Insufficient documentation

## 2014-12-02 DIAGNOSIS — Z79899 Other long term (current) drug therapy: Secondary | ICD-10-CM | POA: Insufficient documentation

## 2014-12-02 DIAGNOSIS — R05 Cough: Secondary | ICD-10-CM | POA: Insufficient documentation

## 2014-12-02 DIAGNOSIS — I493 Ventricular premature depolarization: Secondary | ICD-10-CM | POA: Insufficient documentation

## 2014-12-02 DIAGNOSIS — I119 Hypertensive heart disease without heart failure: Secondary | ICD-10-CM | POA: Insufficient documentation

## 2014-12-02 DIAGNOSIS — I4891 Unspecified atrial fibrillation: Secondary | ICD-10-CM | POA: Insufficient documentation

## 2014-12-02 HISTORY — DX: Unspecified osteoarthritis, unspecified site: M19.90

## 2014-12-02 LAB — CBC WITH DIFFERENTIAL/PLATELET
BASOS ABS: 0.1 10*3/uL (ref 0.0–0.1)
Basophils Relative: 1 % (ref 0–1)
Eosinophils Absolute: 0.2 10*3/uL (ref 0.0–0.7)
Eosinophils Relative: 2 % (ref 0–5)
HCT: 40.7 % (ref 36.0–46.0)
Hemoglobin: 12.9 g/dL (ref 12.0–15.0)
LYMPHS ABS: 4.1 10*3/uL — AB (ref 0.7–4.0)
LYMPHS PCT: 47 % — AB (ref 12–46)
MCH: 27 pg (ref 26.0–34.0)
MCHC: 31.7 g/dL (ref 30.0–36.0)
MCV: 85.1 fL (ref 78.0–100.0)
Monocytes Absolute: 0.4 10*3/uL (ref 0.1–1.0)
Monocytes Relative: 4 % (ref 3–12)
NEUTROS ABS: 4 10*3/uL (ref 1.7–7.7)
Neutrophils Relative %: 46 % (ref 43–77)
PLATELETS: 313 10*3/uL (ref 150–400)
RBC: 4.78 MIL/uL (ref 3.87–5.11)
RDW: 15.3 % (ref 11.5–15.5)
WBC: 8.7 10*3/uL (ref 4.0–10.5)

## 2014-12-02 LAB — BASIC METABOLIC PANEL
ANION GAP: 11 (ref 5–15)
BUN: 11 mg/dL (ref 6–23)
CALCIUM: 9.3 mg/dL (ref 8.4–10.5)
CHLORIDE: 101 meq/L (ref 96–112)
CO2: 28 meq/L (ref 19–32)
Creatinine, Ser: 0.79 mg/dL (ref 0.50–1.10)
GFR calc Af Amer: >90 mL/min (ref >90)
GFR calc non Af Amer: >90 mL/min (ref >90)
Glucose, Bld: 123 mg/dL — ABNORMAL HIGH (ref 70–99)
POTASSIUM: 3.8 meq/L (ref 3.7–5.3)
Sodium: 140 mEq/L (ref 137–147)

## 2014-12-02 LAB — PROTIME-INR
INR: 2.45 — ABNORMAL HIGH (ref 0.00–1.49)
PROTHROMBIN TIME: 26.8 s — AB (ref 11.6–15.2)

## 2014-12-02 LAB — I-STAT TROPONIN, ED: Troponin i, poc: 0 ng/mL (ref 0.00–0.08)

## 2014-12-02 NOTE — Discharge Instructions (Signed)
Follow up with Dr. Acie Fredrickson for further evaluation if symptoms persist. Return to the Emergency Department with worsening or concerning symptoms such as chest pain or shortness of breath. Refer to attached documents for more information.

## 2014-12-02 NOTE — ED Provider Notes (Signed)
CSN: [id number]     Arrival date & time 12/02/14  1204 History   First MD Initiated Contact with Patient 12/02/14 1234     Chief Complaint  Patient presents with  . Palpitations    HPI: Jacqueline Petersen is a 58 year old with PMH of HTN, HLD, and Atrial Fibrillation who presents to the ED on 12/02/2014 for palpitations that have been consistent for 3 weeks. She reports the palpitations are always present but she notices them more when lying down or being still. She cannot recall a time over the past few weeks when she has not had palpitations. Reports having shortness of breath at night when the palpitations are more noticeable. She also notes a small amount of chest pain along her lower left sternal region that comes and goes on a daily basis over the past several weeks. She reports having increased pressure along the frontal area of her scalp and that feels like a tight-band. She also reports consuming cold water this morning, and says her hands tingled at that time. She denies any numbness of weakness along her extremities. The patient was seen by Dr. Acie Fredrickson on 11/17/2014 and says she informed him of the palpitations at that time but says he did not change her medications and everything showed she was in normal sinus rhythm. She reports good compliance with her medications and and says her Coumadin is monitored by Dr. Elmarie Shiley office.  Past Medical History  Diagnosis Date  . Hypertension   . A-fib   . History of hysterectomy   . Acid reflux   . Hyperlipidemia   . Fatigue   . Personal history of long-term (current) use of anticoagulants   . Dyslipidemia   . GERD (gastroesophageal reflux disease)   . Hypokalemia   . Personal history of hypertensive heart disease   . Obesity   . Arthritis    Past Surgical History  Procedure Laterality Date  . Partial hysterectomy    . Cardiac catheterization  04/15/2007    Est. EF of 50-55% -- Smooth and normal coronary arteries -- Normal left ventricular  systolic function.  We will continue with further treatment of her atrial fibrillation     . Abdominal hysterectomy     Family History  Problem Relation Age of Onset  . Coronary artery disease    . Hypertension     History  Substance Use Topics  . Smoking status: Never Smoker   . Smokeless tobacco: Not on file  . Alcohol Use: No   OB History    No data available     Review of Systems  Constitutional: Positive for chills. Negative for fever, diaphoresis, activity change, appetite change and fatigue.  HENT: Negative for congestion, ear discharge, ear pain, facial swelling, nosebleeds, rhinorrhea, sinus pressure, sore throat and voice change.   Eyes: Negative for photophobia and visual disturbance.  Respiratory: Positive for cough and shortness of breath. Negative for choking, chest tightness, wheezing and stridor.   Cardiovascular: Positive for chest pain and palpitations. Negative for leg swelling.  Gastrointestinal: Negative for abdominal distention.  Genitourinary: Negative for dysuria, urgency, frequency, hematuria, flank pain, difficulty urinating and pelvic pain.  Musculoskeletal: Positive for arthralgias. Negative for back pain, joint swelling, gait problem, neck pain and neck stiffness.  Skin: Negative for pallor and rash.  Neurological: Positive for headaches. Negative for dizziness, tremors, seizures, syncope, weakness, light-headedness and numbness.  Hematological: Negative for adenopathy. Does not bruise/bleed easily.      Allergies  Lipitor  Home Medications   Prior to Admission medications   Medication Sig Start Date End Date Taking? Authorizing Provider  acetaminophen (TYLENOL ARTHRITIS PAIN) 650 MG CR tablet Take 1,300 mg by mouth 2 (two) times daily as needed for pain.   Yes Historical Provider, MD  amLODipine (NORVASC) 5 MG tablet Take 1 tablet (5 mg total) by mouth daily. 09/09/14  Yes Thayer Headings, MD  B Complex-C (B-COMPLEX WITH VITAMIN C) tablet Take 1  tablet by mouth daily.   Yes Historical Provider, MD  Cholecalciferol (VITAMIN D) 2000 UNITS CAPS Take 2,000 Units by mouth every evening.   Yes Historical Provider, MD  Cinnamon 500 MG capsule Take 500 mg by mouth daily.    Yes Historical Provider, MD  metoprolol succinate (TOPROL-XL) 100 MG 24 hr tablet Take 100 mg by mouth at bedtime. Take with or immediately following a meal.   Yes Historical Provider, MD  Multiple Vitamin (MULTIVITAMIN WITH MINERALS) TABS tablet Take 1 tablet by mouth daily.   Yes Historical Provider, MD  omeprazole (PRILOSEC) 20 MG capsule Take 40 mg by mouth daily.   Yes Historical Provider, MD  rosuvastatin (CRESTOR) 10 MG tablet Take 10 mg by mouth at bedtime.    Yes Historical Provider, MD  warfarin (COUMADIN) 5 MG tablet Take 2-5 mg by mouth daily. Take 5 mg daily except take 2 mg (1/2 tablet) on Monday & fridays   Yes Historical Provider, MD   BP 127/70 mmHg  Pulse 76  Temp(Src) 97.7 F (36.5 C) (Oral)  Resp 19  SpO2 99% Physical Exam  Constitutional: She is oriented to person, place, and time. She appears well-developed and well-nourished. No distress.  HENT:  Head: Normocephalic and atraumatic.  Nose: Nose normal.  Mouth/Throat: Oropharynx is clear and moist. No oropharyngeal exudate.  Eyes: Conjunctivae are normal. Pupils are equal, round, and reactive to light. Right eye exhibits no discharge. Left eye exhibits no discharge.  Neck: Normal range of motion. Neck supple. No thyromegaly present.  Cardiovascular: Normal rate, regular rhythm, normal heart sounds and intact distal pulses.  Exam reveals no gallop and no friction rub.   No murmur heard. Occasional PVCs noted.   Pulmonary/Chest: Effort normal and breath sounds normal. No stridor. No respiratory distress. She has no wheezes. She has no rales. She exhibits no tenderness.  Abdominal: Soft. Bowel sounds are normal. She exhibits no distension and no mass. There is no tenderness. There is no rebound and no  guarding.  Musculoskeletal: Normal range of motion. She exhibits no edema or tenderness.  Lymphadenopathy:    She has no cervical adenopathy.  Neurological: She is alert and oriented to person, place, and time. No cranial nerve deficit. Coordination normal.  Skin: Skin is warm and dry. No rash noted. She is not diaphoretic. No erythema. No pallor.  Nursing note and vitals reviewed.   ED Course  Procedures (including critical care time) Labs Review Labs Reviewed  CBC WITH DIFFERENTIAL - Abnormal; Notable for the following:    Lymphocytes Relative 47 (*)    Lymphs Abs 4.1 (*)    All other components within normal limits  BASIC METABOLIC PANEL - Abnormal; Notable for the following:    Glucose, Bld 123 (*)    All other components within normal limits  PROTIME-INR - Abnormal; Notable for the following:    Prothrombin Time 26.8 (*)    INR 2.45 (*)    All other components within normal limits  I-STAT TROPOININ, ED    Imaging Review  No results found.   EKG Interpretation   Date/Time:  Wednesday December 02 2014 12:26:57 EST Ventricular Rate:  74 PR Interval:  138 QRS Duration: 91 QT Interval:  418 QTC Calculation: 464 R Axis:   -25 Text Interpretation:  Sinus rhythm Abnormal R-wave progression, late  transition LVH by voltage Nonspecific ST changes Confirmed by Hazle Coca  [phone]) on 12/02/2014 1:37:31 PM      MDM   2:00 PM: Telemetry showed 1 PVC's every 1-2 minutes.  Final diagnoses:  PVC's (premature ventricular contractions)    3:19 PM Patient's EKG unchanged from previous. Vitals stable and patient afebrile. Troponin and remaining labs unremarkable for acute changes. Patient will be discharged with recommended Cardiology follow up. Patient's palpitation sensation likely due to occasional PVCs seen on the monitor during history of physical. Patient instructed to return with worsening or concerning symptoms. INR therapeutic at this time.     Alvina Chou,  PA-C 12/02/14 [place], MD 12/02/14 [phone]

## 2014-12-02 NOTE — ED Notes (Signed)
Pt states that she has been having palpitations for several weeks.  Pt states that she went to her cardiologist about 2 weeks ago and they told her everything was fine.  Pt states that her heart "goes beat, beat, beat, even when I lay down or when I sit up.  Today i drank some water then i got the shakes."

## 2014-12-04 ENCOUNTER — Telehealth: Payer: Self-pay | Admitting: Physician Assistant

## 2014-12-04 ENCOUNTER — Encounter: Payer: Self-pay | Admitting: Physician Assistant

## 2014-12-04 ENCOUNTER — Ambulatory Visit (INDEPENDENT_AMBULATORY_CARE_PROVIDER_SITE_OTHER): Payer: No Typology Code available for payment source | Admitting: Physician Assistant

## 2014-12-04 VITALS — BP 115/64 | HR 64 | Ht 63.0 in | Wt 253.0 lb

## 2014-12-04 DIAGNOSIS — G4733 Obstructive sleep apnea (adult) (pediatric): Secondary | ICD-10-CM

## 2014-12-04 DIAGNOSIS — I10 Essential (primary) hypertension: Secondary | ICD-10-CM

## 2014-12-04 DIAGNOSIS — E785 Hyperlipidemia, unspecified: Secondary | ICD-10-CM

## 2014-12-04 DIAGNOSIS — I480 Paroxysmal atrial fibrillation: Secondary | ICD-10-CM

## 2014-12-04 DIAGNOSIS — R0600 Dyspnea, unspecified: Secondary | ICD-10-CM

## 2014-12-04 DIAGNOSIS — I493 Ventricular premature depolarization: Secondary | ICD-10-CM

## 2014-12-04 DIAGNOSIS — R002 Palpitations: Secondary | ICD-10-CM

## 2014-12-04 LAB — TSH: TSH: 0.95 u[IU]/mL (ref 0.35–4.50)

## 2014-12-04 LAB — MAGNESIUM: Magnesium: 2 mg/dL (ref 1.5–2.5)

## 2014-12-04 MED ORDER — METOPROLOL SUCCINATE ER 50 MG PO TB24: ORAL_TABLET | ORAL | Status: DC

## 2014-12-04 NOTE — Progress Notes (Signed)
Cardiology Office Note   Date:  12/04/2014   ID:  Jacqueline Petersen, Jacqueline Petersen May 31, 1956, MRN [id number]  PCP:  PROVIDER NOT IN SYSTEM  Cardiologist:  Dr. Liam Rogers     History of Present Illness: Jacqueline Petersen is a 58 y.o. female with a hx of HTN, HL, paroxysmal atrial fibrillation previously on Coumadin, GERD and obesity. Admitted in 11/2013 with atrial fibrillation with RVR. She converted to NSR on IV diltiazem. It was felt that she needed long-term anticoagulation. CHADS2-VASc=2. She was placed on Coumadin. Last seen by Dr. Liam Rogers last month in FU.  She recently presented to the ED 12/02/14 with complaints of palpitations.  Troponin was negative. ECG demonstrated NSR. Telemetry demonstrated several PVCs. She returns for follow-up.  Over the last several weeks, she has noted increasing palpitations. She tells me that her son is incarcerated. Her husband has prostate cancer and is getting ready to go through surgery. She does admit to increasing caffeine intake recently. She feels her heart thumping at times. It does remind her of her atrial fibrillation. However, she had symptoms today with several PVCs during her ECG.  She denies chest pain. She does note some dyspnea with exertion. She is NYHA 2-2b. She sleeps on 3 pillows chronically. She denies PND. She sleeps with CPAP. She denies significant LE edema. She denies syncope. She does feel dizzy with her palpitations.   Studies:   - LHC (03/2007): Normal coronary arteries, EF 50-55%.   - Echocardiogram (12/04/2013): Moderate LVH, EF 50-55%, normal wall motion.   Recent Labs: 12/02/2014: BUN 11; Creatinine 0.79; Hemoglobin 12.9; Potassium 3.8; Sodium 140    Recent Radiology: No results found.    Wt Readings from Last 3 Encounters:  12/04/14 253 lb (114.76 kg)  11/17/14 253 lb 12.8 oz (115.123 kg)  11/16/14 254 lb (115.214 kg)     Past Medical History  Diagnosis Date  . Hypertension   . A-fib   .  History of hysterectomy   . Acid reflux   . Hyperlipidemia   . Fatigue   . Personal history of long-term (current) use of anticoagulants   . Dyslipidemia   . GERD (gastroesophageal reflux disease)   . Hypokalemia   . Personal history of hypertensive heart disease   . Obesity   . Arthritis     Current Outpatient Prescriptions  Medication Sig Dispense Refill  . acetaminophen (TYLENOL ARTHRITIS PAIN) 650 MG CR tablet Take 1,300 mg by mouth 2 (two) times daily as needed for pain.    Marland Kitchen amLODipine (NORVASC) 5 MG tablet Take 1 tablet (5 mg total) by mouth daily. 90 tablet 3  . B Complex-C (B-COMPLEX WITH VITAMIN C) tablet Take 1 tablet by mouth daily.    . Cholecalciferol (VITAMIN D) 2000 UNITS CAPS Take 2,000 Units by mouth every evening.    . Cinnamon 500 MG capsule Take 500 mg by mouth daily.     . metoprolol succinate (TOPROL-XL) 100 MG 24 hr tablet Take 100 mg by mouth at bedtime. Take with or immediately following a meal.    . Multiple Vitamin (MULTIVITAMIN WITH MINERALS) TABS tablet Take 1 tablet by mouth daily.    Marland Kitchen omeprazole (PRILOSEC) 20 MG capsule Take 40 mg by mouth daily.    . rosuvastatin (CRESTOR) 10 MG tablet Take 10 mg by mouth at bedtime.     Marland Kitchen warfarin (COUMADIN) 5 MG tablet Take 2-5 mg by mouth daily. Take 5 mg daily except take 2 mg (1/2 tablet)  on Monday & fridays     No current facility-administered medications for this visit.     Allergies:   Lipitor   Social History:  The patient  reports that she has never smoked. She does not have any smokeless tobacco history on file. She reports that she does not drink alcohol or use illicit drugs.   Family History:  The patient's family history includes Coronary artery disease in an other family member; Heart failure in her cousin; Hypertension in her son and another family member. There is no history of Heart attack or Stroke.    ROS:  Please see the history of present illness.   She has occasional diarrhea. She has a  recent URI. She has a nonproductive cough.   All other systems reviewed and negative.    PHYSICAL EXAM: VS:  BP 115/64 mmHg  Pulse 64  Ht 5\' 3"  (1.6 m)  Wt 253 lb (114.76 kg)  BMI 44.83 kg/m2  SpO2 98% Well nourished, well developed, in no acute distress HEENT: normal Neck: no JVD Cardiac:  normal S1, S2;  RRR; no murmur   Lungs:   clear to auscultation bilaterally, no wheezing, rhonchi or rales Abd: soft, nontender, no hepatomegaly Ext: no edema Skin: warm and dry Neuro:  CNs 2-12 intact, no focal abnormalities noted  EKG:  NSR, HR 77, left axis deviation, nonspecific ST-T wave changes, frequent PVCs      ASSESSMENT AND PLAN:   1.  Palpitations:  She is experiencing symptomatic PVCs. She had 3 PVCs during her ECG today which would indicate a fairly high PVC burden. However, I believe that her symptoms are driven by increased stress and increased caffeine. We had a long discussion regarding decreasing her caffeine intake.    -  Check TSH, magnesium today.    -  Repeat echocardiogram to ensure LV function remains normal.    -  Decrease caffeine.    -  Increase Toprol-XL to 100 mg in the morning and 50 mg in the evening.    -  At this point, I do not believe a Holter monitor would be helpful as we did capture PVCs on her ECG today with symptoms. Consider monitor in the future if needed.   2.  Paroxsymal Atrial Fibrillation:  No apparent recurrence. Continue Coumadin. She seems to be tolerating this. 3.  Hypertension:  Controlled. I will hold her Norvasc with the increase in her Toprol. I have asked her to monitor blood pressures at home and call us if they start to increase. At that point, I will resume amlodipine if needed at 2.5 mg or 5 mg depending upon her blood pressure readings. 4.  Hyperlipidemia:   Continue statin. 5.  Sleep Apnea:   Continue CPAP.   Disposition:   FU with  me in 3 weeks.   Signed, Versie Starks, MHS 12/04/2014 9:45 AM    [facility]  [facility], [place], [place]  [address] Phone: [phone]; Fax: [phone]

## 2014-12-04 NOTE — Telephone Encounter (Signed)
New message     Pt saw Nicki Reaper today.  There is no presc [facility] for this lady.  Please call her when the presc has been called in so that she can go back to the health dept and get it.

## 2014-12-04 NOTE — Telephone Encounter (Signed)
i [facility] and left new Rx for Metoprolol succinate 50mg  2 in am, 1 in pm #90/5 refills. Patient informed of this.

## 2014-12-04 NOTE — Patient Instructions (Addendum)
Decrease your caffeine intake.  You should really try to eliminate all caffeine. This can cause your palpitations to increase.  Increase your Toprol-XL 50 mg to 2 tablets (100 mg) in the morning and 1 tablet (50 mg) in the evening.  Take them 12 hours apart.  Hold your Amlodipine (Norvasc) for now.  Check your BP once a day for the next 2 weeks.  If your BP is greater than 140/90, call me back ([phone]).  Labs today:  Magnesium, TSH.  Schedule an Echocardiogram to recheck your heart function.  Your physician has requested that you have an echocardiogram. Echocardiography is a painless test that uses sound waves to create images of your heart. It provides your doctor with information about the size and shape of your heart and how well your heart's chambers and valves are working. This procedure takes approximately one hour. There are no restrictions for this procedure.   Schedule follow up with [name], PA-C in 3 weeks.   Premature Ventricular Contraction Premature ventricular contraction (PVC) is an irregularity of the heart rhythm involving extra or skipped heartbeats. In some cases, they may occur without obvious cause or heart disease. Other times, they can be caused by an electrolyte change in the blood. These need to be corrected. They can also be seen when there is not enough oxygen going to the heart. A common cause of this is plaque or cholesterol buildup. This buildup decreases the blood supply to the heart. In addition, extra beats may be caused or aggravated by:  Excessive smoking.  Alcohol consumption.  Caffeine.  Certain medications  Some street drugs. SYMPTOMS   The sensation of feeling your heart skipping a beat (palpitations).  In many cases, the person may have no symptoms. SIGNS AND TESTS   A physical examination may show an occasional irregularity, but if the PVC beats do not happen often, they may not be found on physical exam.  Blood pressure is  usually normal.  Other tests that may find extra beats of the heart are:  An EKG (electrocardiogram)  A Holter monitor which can monitor your heart over longer periods of time  An Angiogram (study of the heart arteries). TREATMENT  Usually extra heartbeats do not need treatment. The condition is treated only if symptoms are severe or if extra beats are very frequent or are causing problems. An underlying cause, if discovered, may also require treatment.  Treatment may also be needed if there may be a risk for other more serious cardiac arrhythmias.  PREVENTION   Moderation in caffeine, alcohol, and tobacco use may reduce the risk of ectopic heartbeats in some people.  Exercise often helps people who lead a sedentary (inactive) lifestyle. PROGNOSIS  PVC heartbeats are generally harmless and do not need treatment.  RISKS AND COMPLICATIONS   Ventricular tachycardia (occasionally).  There usually are no complications.  Other arrhythmias (occasionally). SEEK IMMEDIATE MEDICAL CARE IF:   You feel palpitations that are frequent or continual.  You develop chest pain or other problems such as shortness of breath, sweating, or nausea and vomiting.  You become light-headed or faint (pass out).  You get worse or do not improve with treatment. Document Released: 07/28/2004 Document Revised: 03/04/2012 Document Reviewed: 02/07/2008 [facility] Patient Information 2015 [place], [place]. This information is not intended to replace advice given to you by your health care provider. Make sure you discuss any questions you have with your health care provider.

## 2014-12-08 ENCOUNTER — Telehealth: Payer: Self-pay | Admitting: Physician Assistant

## 2014-12-08 NOTE — Telephone Encounter (Signed)
I rtnd Cathy's call from [facility] to verify dose of the Toprol XL 100 mg in AM and 50 mg PM. I said yes [name], PA did order the Toprol XL to be taken twice daily for this pt. Tye [place] said thank you for my help.

## 2014-12-08 NOTE — Telephone Encounter (Signed)
[facility] calling, needs information on dosage for prescription that was sent yesterday.   Please call ask for [facility] at [phone].

## 2014-12-10 ENCOUNTER — Other Ambulatory Visit (HOSPITAL_COMMUNITY): Payer: No Typology Code available for payment source | Admitting: *Deleted

## 2014-12-10 ENCOUNTER — Encounter: Payer: Self-pay | Admitting: Physician Assistant

## 2014-12-10 ENCOUNTER — Ambulatory Visit (HOSPITAL_COMMUNITY): Payer: No Typology Code available for payment source | Attending: Cardiology | Admitting: Radiology

## 2014-12-10 DIAGNOSIS — R0600 Dyspnea, unspecified: Secondary | ICD-10-CM

## 2014-12-10 DIAGNOSIS — R002 Palpitations: Secondary | ICD-10-CM | POA: Insufficient documentation

## 2014-12-10 DIAGNOSIS — R079 Chest pain, unspecified: Secondary | ICD-10-CM | POA: Insufficient documentation

## 2014-12-10 DIAGNOSIS — I480 Paroxysmal atrial fibrillation: Secondary | ICD-10-CM

## 2014-12-10 DIAGNOSIS — I10 Essential (primary) hypertension: Secondary | ICD-10-CM | POA: Insufficient documentation

## 2014-12-10 DIAGNOSIS — E785 Hyperlipidemia, unspecified: Secondary | ICD-10-CM | POA: Insufficient documentation

## 2014-12-10 DIAGNOSIS — I493 Ventricular premature depolarization: Secondary | ICD-10-CM

## 2014-12-10 DIAGNOSIS — R0602 Shortness of breath: Secondary | ICD-10-CM | POA: Insufficient documentation

## 2014-12-10 MED ORDER — PERFLUTREN LIPID MICROSPHERE
3.0000 mL | Freq: Once | INTRAVENOUS | Status: AC
  Administered 2014-12-10: 3 mL via INTRAVENOUS

## 2014-12-10 NOTE — Progress Notes (Addendum)
Echocardiogram performed with [facility].  

## 2014-12-11 ENCOUNTER — Other Ambulatory Visit: Payer: Self-pay

## 2014-12-11 ENCOUNTER — Telehealth: Payer: Self-pay | Admitting: Physician Assistant

## 2014-12-11 NOTE — Telephone Encounter (Signed)
Patient calls today after checking Bp yesterday, states it was 169/118. Her Metoprolol was changed to 100mg  in am, 50 mg in pm at last OV. She feels like it is doing the opposite of what it is supposed to do. Feels like her heart is pounding more. i asked her what her BP was today, but she doesn,t know. Asked her to check BP again, and call us back with that. She voiced understanding.

## 2014-12-11 NOTE — Telephone Encounter (Signed)
Resume Amlodipine at 5 mg daily. [name], PA-C   12/11/2014 2:42 PM

## 2014-12-11 NOTE — Telephone Encounter (Signed)
done

## 2014-12-11 NOTE — Telephone Encounter (Signed)
Forward to [employer]

## 2014-12-11 NOTE — Telephone Encounter (Signed)
Pt c/o BP issue:  1. What are your last 5 BP readings? Today 172/92.. 2. Are you having any other symptoms (ex. Dizziness, headache, blurred vision, passed out)?  3. What is your medication issue?   Jacqueline Petersen pt is returning your call and gave her last BP reading.

## 2014-12-11 NOTE — Telephone Encounter (Signed)
New message       Pt states she was advised by scott to call back if bp was high   bp yesterday was 169/118   please give pt a call   pt also has some additional questions about her recent medication change

## 2014-12-11 NOTE — Telephone Encounter (Signed)
This reading was taken at the [facility]. According to [facility] last note, he would add Norvasc if BP was still elevated. [facility] Patient aware of this. Has followup 12/29/2014 with Nicki Reaper

## 2014-12-15 ENCOUNTER — Telehealth: Payer: Self-pay | Admitting: Cardiovascular Disease

## 2014-12-15 NOTE — Telephone Encounter (Signed)
Pt reports she is stilling have frequent daily palps and that this has been occuring for 3 weeks "straight"  Also concerned that her BP is not coming down.  She was restarted on Amlodipine 5 mg on 12/18.  Advised she has not been back on Amlodipine long enough to see much of a change as of yet.  Instructed pt I will forward information to [name] for review and any further orders.  Pt has an appt 1/7 and will f/u then.

## 2014-12-15 NOTE — Telephone Encounter (Signed)
Pt c/o medication issue: 1. Name of Medication: toprolol 2. How are you currently taking this medication (dosage and times per day)? 100mg  in the am and 50 in the pm  3. Are you having a reaction (difficulty breathing--STAT)? No difficulty in breathing  4. What is your medication issue? Pt states she is still having PVC's. She believes that when she takes the medication her palpitations increase.

## 2014-12-15 NOTE — Telephone Encounter (Signed)
Follow Up   Pt calling to report her blood pressure: 160/98

## 2014-12-15 NOTE — Telephone Encounter (Signed)
Discussed with patient. She continues to feel her heart pounding fast after she gets up in the morning, also with activity throughout the day. She feels the pounding in her head. Gets relief from these symptoms when she lies down.  Has a bp cuff at home but wants to [place] the fire station for bp because she thinks it will be more accurate. She will call back with reading.

## 2014-12-17 NOTE — Telephone Encounter (Signed)
I called the patient at home. She continues to have palpitations are worse when she takes the Toprol. I asked her to chnge her Toprol to 50 mg twice a day. She will get her blood pressure checked at the fire station in the next several days. If her blood pressure is greater than 377 systolic, she will increase amlodipine to 10 mg a day.

## 2014-12-24 ENCOUNTER — Ambulatory Visit (INDEPENDENT_AMBULATORY_CARE_PROVIDER_SITE_OTHER)
Payer: No Typology Code available for payment source | Admitting: Pharmacist Clinician (PhC)/ Clinical Pharmacy Specialist

## 2014-12-24 DIAGNOSIS — I4891 Unspecified atrial fibrillation: Secondary | ICD-10-CM

## 2014-12-24 DIAGNOSIS — Z5181 Encounter for therapeutic drug level monitoring: Secondary | ICD-10-CM

## 2014-12-24 DIAGNOSIS — Z7901 Long term (current) use of anticoagulants: Secondary | ICD-10-CM

## 2014-12-24 DIAGNOSIS — I480 Paroxysmal atrial fibrillation: Secondary | ICD-10-CM

## 2014-12-24 LAB — POCT INR: INR: 2.8

## 2014-12-29 ENCOUNTER — Ambulatory Visit: Payer: No Typology Code available for payment source | Admitting: Physician Assistant

## 2014-12-29 ENCOUNTER — Other Ambulatory Visit (HOSPITAL_COMMUNITY): Payer: Self-pay | Admitting: Family Medicine

## 2014-12-29 DIAGNOSIS — Z1231 Encounter for screening mammogram for malignant neoplasm of breast: Secondary | ICD-10-CM

## 2014-12-31 ENCOUNTER — Ambulatory Visit (INDEPENDENT_AMBULATORY_CARE_PROVIDER_SITE_OTHER): Payer: No Typology Code available for payment source | Admitting: Physician Assistant

## 2014-12-31 ENCOUNTER — Encounter: Payer: Self-pay | Admitting: *Deleted

## 2014-12-31 ENCOUNTER — Encounter: Payer: Self-pay | Admitting: Physician Assistant

## 2014-12-31 VITALS — BP 113/60 | HR 73 | Ht 63.0 in | Wt 253.0 lb

## 2014-12-31 DIAGNOSIS — I4891 Unspecified atrial fibrillation: Secondary | ICD-10-CM

## 2014-12-31 DIAGNOSIS — E785 Hyperlipidemia, unspecified: Secondary | ICD-10-CM

## 2014-12-31 DIAGNOSIS — I10 Essential (primary) hypertension: Secondary | ICD-10-CM

## 2014-12-31 DIAGNOSIS — R042 Hemoptysis: Secondary | ICD-10-CM

## 2014-12-31 DIAGNOSIS — G4733 Obstructive sleep apnea (adult) (pediatric): Secondary | ICD-10-CM

## 2014-12-31 DIAGNOSIS — I480 Paroxysmal atrial fibrillation: Secondary | ICD-10-CM

## 2014-12-31 DIAGNOSIS — I493 Ventricular premature depolarization: Secondary | ICD-10-CM

## 2014-12-31 DIAGNOSIS — R002 Palpitations: Secondary | ICD-10-CM

## 2014-12-31 LAB — CBC WITH DIFFERENTIAL/PLATELET
Basophils Absolute: 0.1 10*3/uL (ref 0.0–0.1)
Basophils Relative: 0.6 % (ref 0.0–3.0)
EOS PCT: 1.9 % (ref 0.0–5.0)
Eosinophils Absolute: 0.2 10*3/uL (ref 0.0–0.7)
HEMATOCRIT: 41.1 % (ref 36.0–46.0)
HEMOGLOBIN: 13.4 g/dL (ref 12.0–15.0)
LYMPHS ABS: 4.2 10*3/uL — AB (ref 0.7–4.0)
Lymphocytes Relative: 44.9 % (ref 12.0–46.0)
MCHC: 32.6 g/dL (ref 30.0–36.0)
MCV: 83.4 fl (ref 78.0–100.0)
MONOS PCT: 5.4 % (ref 3.0–12.0)
Monocytes Absolute: 0.5 10*3/uL (ref 0.1–1.0)
NEUTROS ABS: 4.4 10*3/uL (ref 1.4–7.7)
Neutrophils Relative %: 47.2 % (ref 43.0–77.0)
Platelets: 336 10*3/uL (ref 150.0–400.0)
RBC: 4.93 Mil/uL (ref 3.87–5.11)
RDW: 15.6 % — AB (ref 11.5–15.5)
WBC: 9.4 10*3/uL (ref 4.0–10.5)

## 2014-12-31 MED ORDER — AMLODIPINE BESYLATE 5 MG PO TABS: 5.0000 mg | ORAL_TABLET | Freq: Two times a day (BID) | ORAL | Status: DC

## 2014-12-31 MED ORDER — METOPROLOL SUCCINATE ER 50 MG PO TB24
ORAL_TABLET | ORAL | Status: DC
Start: 2014-12-31 — End: 2016-01-26

## 2014-12-31 NOTE — Progress Notes (Signed)
Patient ID: Jacqueline Petersen, female   DOB: [birth year], 59 y.o.   MRN: [id number] EVO 24 hour holter monitor applied to patient.

## 2014-12-31 NOTE — Patient Instructions (Addendum)
REFILLS HAVE BEEN SENT IN FOR TOPROL AND AMLODIPINE  LAB WORK TODAY; CBC W/DIFF  You have been referred to DR. KLEIN DX PVC'S FROM [facility]  Your physician has requested that you have a cardiac MRI TO BE DONE AT [facility] . Cardiac MRI uses a computer to create images of your heart as its beating, producing both still and moving pictures of your heart and major blood vessels. For further information please visit [email or web address] Please follow the instruction sheet given to you today for more information.  YOU WILL NEED TO HAVE A SIGNAL AVERAGE ECG PER SCOTT WEAVER, PAC AND DR. [place]  Your physician recommends that you schedule a follow-up appointment in: [place] DR. Cathie Olden  A chest x-ray takes a picture of the organs and structures inside the chest, including the heart, lungs, and blood vessels. This test can show several things, including, whether the heart is enlarges; whether fluid is building up in the lungs; and whether pacemaker / defibrillator leads are still in place.  Your physician has recommended that you wear a 24 HOUR holter monitor. Holter monitors are medical devices that record the heart's electrical activity. Doctors most often use these monitors to diagnose arrhythmias. Arrhythmias are problems with the speed or rhythm of the heartbeat. The monitor is a small, portable device. You can wear one while you do your normal daily activities. This is usually used to diagnose what is causing palpitations/syncope (passing out).   PER SCOTT WEAVER, PAC YOU WILL NEED TO CALL DR. OSBORNE'S OFFICE TODAY IN REGARDS TO NOSE BLEEDS AND YOUR CPAP MACHINE

## 2014-12-31 NOTE — Progress Notes (Signed)
Cardiology Office Note   Date:  12/31/2014   ID:  Jalaiyah, Throgmorton [id number], MRN [id number]  PCP:  Lynne Logan, MD  Cardiologist:  Dr. Liam Rogers     History of Present Illness: Jacqueline Petersen is a 59 y.o. female with a hx of HTN, HL, paroxysmal atrial fibrillation previously on Coumadin, GERD and obesity. Admitted in 11/2013 with atrial fibrillation with RVR. She converted to NSR on IV diltiazem. It was felt that she needed long-term anticoagulation. CHADS2-VASc=2. She was placed on Coumadin. She presented to the ED 12/02/14 with complaints of palpitations.  Troponin was negative. ECG demonstrated NSR. Telemetry demonstrated several PVCs.  I saw her in FU 12/04/14.  She had several PVCs on her ECG.  She admitted to increased caffeine and I advised her to reduce this.  I adjusted her Toprol-XL to 100 mg in AM and 50 mg in PM.  Follow-up echocardiogram was normal. TSH was also normal.  I held her Norvasc. However, she called in with increasing blood pressures and Norvasc was resumed. She then called back with side effects to the higher dose of Toprol in the morning. I cut her back to Toprol-XL 50 mg twice a day.  She returns for FU.  Her palpitations are only marginally improved.  She continues to have several PVCs on her ECG.  She denies chest pain, significant dyspnea, orthopnea, significant edema.  She sleeps with CPAP.  She has noted over the past few weeks what sounds like epistaxis in the AM along with HAs.  She also states she is coughing up blood but denies a significant cough.  No fevers.  She is a non-smoker.     Studies:   - LHC (03/2007): Normal coronary arteries, EF 50-55%.   - Echocardiogram (12/04/2013): Moderate LVH, EF 50-55%, normal wall motion.  - Echo (12/10/14):  EF 55-60%, normal wall motion, PASP 32 mmHg   Recent Labs: 12/02/2014: BUN 11; Creatinine 0.79; Hemoglobin 12.9; Potassium 3.8; Sodium 140 12/04/2014: TSH 0.95    Recent  Radiology: No results found.    Wt Readings from Last 3 Encounters:  12/31/14 253 lb (114.76 kg)  12/04/14 253 lb (114.76 kg)  11/17/14 253 lb 12.8 oz (115.123 kg)     Past Medical History  Diagnosis Date  . Hypertension   . A-fib   . History of hysterectomy   . Acid reflux   . Hyperlipidemia   . Fatigue   . Personal history of long-term (current) use of anticoagulants   . Dyslipidemia   . GERD (gastroesophageal reflux disease)   . Hypokalemia   . Personal history of hypertensive heart disease   . Obesity   . Arthritis   . Hx of echocardiogram     Echocardiogram (12/15): EF 55-60%, normal wall motion, PASP 32 mmHg    Current Outpatient Prescriptions  Medication Sig Dispense Refill  . acetaminophen (TYLENOL ARTHRITIS PAIN) 650 MG CR tablet Take 1,300 mg by mouth 2 (two) times daily as needed for pain.    Marland Kitchen amLODipine (NORVASC) 5 MG tablet Take 1 tablet (5 mg total) by mouth daily. 90 tablet 3  . B Complex-C (B-COMPLEX WITH VITAMIN C) tablet Take 1 tablet by mouth daily.    . Cholecalciferol (VITAMIN D) 2000 UNITS CAPS Take 2,000 Units by mouth every evening.    . Cinnamon 500 MG capsule Take 500 mg by mouth daily.     . metoprolol succinate (TOPROL-XL) 50 MG 24 hr tablet Take 2 tablets (100  mg) in the morning and 1 tablet (50 mg) in the evening.  Try to take these 12 hours apart. Take with or immediately following a meal. 90 tablet 11  . Multiple Vitamin (MULTIVITAMIN WITH MINERALS) TABS tablet Take 1 tablet by mouth daily.    Marland Kitchen omeprazole (PRILOSEC) 20 MG capsule Take 40 mg by mouth daily.    . rosuvastatin (CRESTOR) 10 MG tablet Take 10 mg by mouth at bedtime.     Marland Kitchen warfarin (COUMADIN) 5 MG tablet Take 2-5 mg by mouth daily. Take 5 mg daily except take 2 mg (1/2 tablet) on Monday & fridays     No current facility-administered medications for this visit.     Allergies:   Lipitor   Social History:  The patient  reports that she has never smoked. She does not have any  smokeless tobacco history on file. She reports that she does not drink alcohol or use illicit drugs.   Family History:  The patient's family history includes Coronary artery disease in an other family member; Heart failure in her cousin; Hypertension in her son and another family member. There is no history of Heart attack or Stroke.    ROS:  Please see the history of present illness.    All other systems reviewed and negative.    PHYSICAL EXAM: VS:  BP 113/60 mmHg  Pulse 73  Ht 5\' 3"  (1.6 m)  Wt 253 lb (114.76 kg)  BMI 44.83 kg/m2 Well nourished, well developed, in no acute distress HEENT: normal Neck: no JVD Cardiac:  normal S1, S2;  RRR; no murmur   Lungs:   clear to auscultation bilaterally, no wheezing, rhonchi or rales Abd: soft, nontender, no hepatomegaly Ext: no edema Skin: warm and dry Neuro:  CNs 2-12 intact, no focal abnormalities noted  EKG:   NSR, HR 73, LAD, unifocal PVCs with LBBB morphology positive in V1-2 and negative in 2, V3-5  ASSESSMENT AND PLAN:   1.  Palpitations:  She continues to have symptomatic PVCs.  I reviewed her case today with Dr. Virl Axe.  Her PVCs seem to be originating from the RV (but not the RVOT).  Her RV appeared normal on the echo.  We discussed whether or not to start Flecainide to control her PVCs.  However, prior to making any changes, Dr. Caryl Comes suggested further testing to rule out underlying problems such as ARVD.      -  Continue current dose of beta blocker.    -  Arrange signal average ECG.    -  Arrange 24 hr Holter to assess PVC burden.    -  Arrange cardiac MRI.    -  Refer to EP (Dr. Caryl Comes) for further evaluation.  2.  Paroxsymal Atrial Fibrillation:  No apparent recurrence. Continue Coumadin. She seems to be tolerating this. 3.  Hypertension:  Controlled.  4.  Hyperlipidemia:    Continue statin. 5.  Sleep Apnea:    Continue CPAP.  6.  Hemoptysis:  She seems to be describing epistaxis.  She is waking up with a HA.  She  seems to be having problems with her CPAP.  She likely needs FU with her sleep medicine provider to further evaluate.    -  Check CBC with diff    -  Obtain CXR    -  FU with Dr. Maxwell Caul.    Disposition:   Refer to Dr. Virl Axe of EP.  FU with Dr. Liam Rogers 3 mos.  Signed, Versie Starks, MHS 12/31/2014 10:00 AM    [facility] [address], [place],   [address] Phone: [phone]; Fax: [phone]

## 2015-01-01 ENCOUNTER — Ambulatory Visit
Admission: RE | Admit: 2015-01-01 | Discharge: 2015-01-01 | Disposition: A | Discharge status: Home / Self Care | Payer: No Typology Code available for payment source | Source: Ambulatory Visit | Attending: Physician Assistant | Admitting: Physician Assistant

## 2015-01-01 ENCOUNTER — Telehealth: Payer: Self-pay | Admitting: *Deleted

## 2015-01-01 DIAGNOSIS — R042 Hemoptysis: Secondary | ICD-10-CM

## 2015-01-01 NOTE — Telephone Encounter (Signed)
pt notified about lab results. She did not get CXR yesterday because she had 24 hour heart monitor put on, but is going later this afternoon to get CXR since she is now done w/monitor. Pt verbalized understanding to results given today.

## 2015-01-04 ENCOUNTER — Encounter: Payer: Self-pay | Admitting: Cardiology

## 2015-01-05 ENCOUNTER — Telehealth: Payer: Self-pay | Admitting: Cardiovascular Disease

## 2015-01-05 NOTE — Telephone Encounter (Signed)
Patient may take mobic

## 2015-01-05 NOTE — Telephone Encounter (Signed)
Informed patient that per Dr. Acie Fredrickson, she may take Mobic for arthritic knee.   Encounter faxed to Dr. Debroah Loop office per patient request.

## 2015-01-05 NOTE — Telephone Encounter (Signed)
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 Percell Miller st she will prescribe Mobic for knee pain pending Dr. Elmarie Shiley approval.  Informed patient that Dr. Acie Fredrickson is out of the office today but that the patient will be updated soon.

## 2015-01-05 NOTE — Telephone Encounter (Signed)
New Message      Patient's and   Dr. Lou Miner want to know if it ok to take Movic for her knees. She needs to know because she has knee pain.    Please give patient a call   Thanks

## 2015-01-18 ENCOUNTER — Telehealth: Payer: Self-pay | Admitting: Cardiovascular Disease

## 2015-01-18 NOTE — Telephone Encounter (Signed)
New Message  Pt called she is on [place]. Requests a call back to discuss if she can take a tylenol

## 2015-01-18 NOTE — Telephone Encounter (Signed)
Returned call to pt, advised ok to take Tylenol arthritis while on Coumadin.

## 2015-01-18 NOTE — Telephone Encounter (Signed)
Error.. Sending this message back to coumadin

## 2015-01-20 ENCOUNTER — Telehealth: Payer: Self-pay | Admitting: *Deleted

## 2015-01-20 ENCOUNTER — Ambulatory Visit (HOSPITAL_COMMUNITY)
Admission: RE | Admit: 2015-01-20 | Discharge: 2015-01-20 | Disposition: A | Discharge status: Home / Self Care | Payer: No Typology Code available for payment source | Source: Ambulatory Visit | Attending: Physician Assistant | Admitting: Physician Assistant

## 2015-01-20 DIAGNOSIS — I493 Ventricular premature depolarization: Secondary | ICD-10-CM

## 2015-01-20 LAB — CREATININE, SERUM
Creatinine, Ser: 0.72 mg/dL (ref 0.50–1.10)
GFR calc Af Amer: >90 mL/min (ref >90)

## 2015-01-20 NOTE — Telephone Encounter (Signed)
pt notified about lab results. She states she did not get the Cardiac MRI done today since they could get an IV in her.

## 2015-01-21 ENCOUNTER — Ambulatory Visit (INDEPENDENT_AMBULATORY_CARE_PROVIDER_SITE_OTHER): Payer: No Typology Code available for payment source | Admitting: *Deleted

## 2015-01-21 DIAGNOSIS — Z7901 Long term (current) use of anticoagulants: Secondary | ICD-10-CM

## 2015-01-21 DIAGNOSIS — I480 Paroxysmal atrial fibrillation: Secondary | ICD-10-CM

## 2015-01-21 DIAGNOSIS — Z5181 Encounter for therapeutic drug level monitoring: Secondary | ICD-10-CM

## 2015-01-21 DIAGNOSIS — I4891 Unspecified atrial fibrillation: Secondary | ICD-10-CM

## 2015-01-21 LAB — POCT INR: INR: 4.7

## 2015-01-26 ENCOUNTER — Ambulatory Visit (HOSPITAL_COMMUNITY)
Admission: RE | Admit: 2015-01-26 | Discharge: 2015-01-26 | Disposition: A | Discharge status: Home / Self Care | Payer: No Typology Code available for payment source | Source: Ambulatory Visit | Attending: Family Medicine | Admitting: Family Medicine

## 2015-01-26 DIAGNOSIS — Z1231 Encounter for screening mammogram for malignant neoplasm of breast: Secondary | ICD-10-CM | POA: Insufficient documentation

## 2015-02-01 ENCOUNTER — Ambulatory Visit (INDEPENDENT_AMBULATORY_CARE_PROVIDER_SITE_OTHER): Payer: No Typology Code available for payment source

## 2015-02-01 DIAGNOSIS — I4891 Unspecified atrial fibrillation: Secondary | ICD-10-CM

## 2015-02-01 DIAGNOSIS — Z5181 Encounter for therapeutic drug level monitoring: Secondary | ICD-10-CM

## 2015-02-01 DIAGNOSIS — Z7901 Long term (current) use of anticoagulants: Secondary | ICD-10-CM

## 2015-02-01 LAB — POCT INR: INR: 4

## 2015-02-12 ENCOUNTER — Ambulatory Visit (INDEPENDENT_AMBULATORY_CARE_PROVIDER_SITE_OTHER): Payer: No Typology Code available for payment source | Admitting: Internal Medicine

## 2015-02-12 ENCOUNTER — Ambulatory Visit (INDEPENDENT_AMBULATORY_CARE_PROVIDER_SITE_OTHER): Payer: No Typology Code available for payment source

## 2015-02-12 ENCOUNTER — Encounter: Payer: Self-pay | Admitting: Internal Medicine

## 2015-02-12 VITALS — BP 112/70 | HR 70 | Ht 64.0 in | Wt 258.6 lb

## 2015-02-12 DIAGNOSIS — I4891 Unspecified atrial fibrillation: Secondary | ICD-10-CM

## 2015-02-12 DIAGNOSIS — I480 Paroxysmal atrial fibrillation: Secondary | ICD-10-CM

## 2015-02-12 DIAGNOSIS — Z5181 Encounter for therapeutic drug level monitoring: Secondary | ICD-10-CM

## 2015-02-12 DIAGNOSIS — I493 Ventricular premature depolarization: Secondary | ICD-10-CM

## 2015-02-12 DIAGNOSIS — Z7901 Long term (current) use of anticoagulants: Secondary | ICD-10-CM

## 2015-02-12 LAB — POCT INR: INR: 2.8

## 2015-02-12 NOTE — Progress Notes (Signed)
ELECTROPHYSIOLOGY CONSULT NOTE  Patient ID: Jacqueline Petersen, MRN: [id number], DOB/AGE: May 28, 1956 59 y.o. Admit date: (Not on file) Date of Consult: 02/12/2015  Primary Physician: Lynne Logan, MD Primary Cardiologist: Pnahser  Chief Complaint: PVXCs   HPI Jacqueline Petersen is a 59 y.o. female  With a history of paroxysmal atrial fibrillation and an echocardiogram 12/15 demonstrating normal left ventricular function with moderate left ventricular hypertrophy. Remote catheterization 4/08 demonstrated normal coronary arteries.  She has had very symptomatic PVCs. Holter monitoring demonstrated 11% burden. They were left bundle superior axis morphology. She has since then eliminated the caffeine from her diet and notes a significant diminution in the burden.  She denies chest pain or shortness of breath. She does have some peripheral edema. She falls asleep while I'm talking to her but she is diagnosed with  obstructive sleep apnea and uses CPAP and her car was read recently.    Past Medical History  Diagnosis Date  . Hypertension   . A-fib   . History of hysterectomy   . Acid reflux   . Hyperlipidemia   . Fatigue   . Personal history of long-term (current) use of anticoagulants   . Dyslipidemia   . GERD (gastroesophageal reflux disease)   . Hypokalemia   . Personal history of hypertensive heart disease   . Obesity   . Arthritis   . Hx of echocardiogram     Echocardiogram (12/15): EF 55-60%, normal wall motion, PASP 32 mmHg      Surgical History:  Past Surgical History  Procedure Laterality Date  . Partial hysterectomy    . Cardiac catheterization  04/15/2007    Est. EF of 50-55% -- Smooth and normal coronary arteries -- Normal left ventricular systolic function.  We will continue with further treatment of her atrial fibrillation     . Abdominal hysterectomy       Home Meds: Prior to Admission medications   Medication Sig Start Date End Date Taking?  Authorizing Provider  acetaminophen (TYLENOL ARTHRITIS PAIN) 650 MG CR tablet Take 1,300 mg by mouth 2 (two) times daily as needed for pain.   Yes Historical Provider, MD  amLODipine (NORVASC) 5 MG tablet Take 1 tablet (5 mg total) by mouth 2 (two) times daily. 12/31/14  Yes Scott T Weaver, PA-C  B Complex-C (B-COMPLEX WITH VITAMIN C) tablet Take 1 tablet by mouth daily.   Yes Historical Provider, MD  Cholecalciferol (VITAMIN D) 2000 UNITS CAPS Take 2,000 Units by mouth every evening.   Yes Historical Provider, MD  Cinnamon 500 MG capsule Take 500 mg by mouth daily.    Yes Historical Provider, MD  metoprolol succinate (TOPROL-XL) 50 MG 24 hr tablet Take 1 tablet (50 mg) in the morning and 2 tablest (100 mg) in the evening.  Try to take these 12 hours apart. Take with or immediately following a meal. Patient taking differently: Take 2 tablets (100 mg) in the morning and 1 tablet (50 mg) in the evening.  Try to take these 12 hours apart. Take with or immediately following a meal. 12/31/14  Yes Scott T Weaver, PA-C  omeprazole (PRILOSEC) 20 MG capsule Take 40 mg by mouth daily.   Yes Historical Provider, MD  rosuvastatin (CRESTOR) 10 MG tablet Take 10 mg by mouth at bedtime.    Yes Historical Provider, MD  warfarin (COUMADIN) 5 MG tablet Take 2-5 mg by mouth daily. Take 5 mg daily except take 2 mg (1/2 tablet) on Monday & fridays  Yes Historical Provider, MD      Allergies:  Allergies  Allergen Reactions  . Lipitor [Atorvastatin Calcium] Swelling    History   Social History  . Marital Status: Married    Spouse Name: N/A  . Number of Children: N/A  . Years of Education: N/A   Occupational History  . Not on file.   Social History Main Topics  . Smoking status: Never Smoker   . Smokeless tobacco: Not on file  . Alcohol Use: No  . Drug Use: No  . Sexual Activity: Not on file   Other Topics Concern  . Not on file   Social History Narrative     Family History  Problem Relation Age  of Onset  . Coronary artery disease    . Hypertension    . Heart failure Cousin   . Heart attack Neg Hx   . Stroke Neg Hx   . Hypertension Son      ROS:  Please see the history of present illness.   All other systems reviewed and negative.    Physical Exam: Blood pressure 112/70, pulse 70, height 5\' 4"  (1.626 m), weight 258 lb 9.6 oz (117.3 kg). General: Well developed, well nourished female in no acute distress. Head: Normocephalic, atraumatic, sclera non-icteric, no xanthomas, nares are without discharge. EENT: normal Lymph Nodes:  none Back: without scoliosis/kyphosis\, no CVA tendersness Neck: Negative for carotid bruits. JVD not elevated. Lungs: Clear bilaterally to auscultation without wheezes, rales, or rhonchi. Breathing is unlabored. Heart: RRR with S1 S2. No murmur , rubs, or gallops appreciated. Abdomen: Soft, non-tender, non-distended with normoactive bowel sounds. No hepatomegaly. No rebound/guarding. No obvious abdominal masses. Msk:  Strength and tone appear normal for age. Extremities: No clubbing or cyanosis. 2+ edema.  Distal pedal pulses are 2+ and equal bilaterally. Skin: Warm and Dry Neuro: Alert and oriented X 3. CN III-XII intact Grossly normal sensory and motor function . Psych:  Responds to questions appropriately with a normal affect.      Labs: Cardiac Enzymes No results for input(s): CKTOTAL, CKMB, TROPONINI in the last 72 hours. CBC Lab Results  Component Value Date   WBC 9.4 12/31/2014   HGB 13.4 12/31/2014   HCT 41.1 12/31/2014   MCV 83.4 12/31/2014   PLT 336.0 12/31/2014   PROTIME:  Recent Labs  02/12/15 0845  INR 2.8   Chemistry No results for input(s): NA, K, CL, CO2, BUN, CREATININE, CALCIUM, PROT, BILITOT, ALKPHOS, ALT, AST, GLUCOSE in the last 168 hours.  Invalid input(s): LABALBU Lipids Lab Results  Component Value Date   CHOL 186 12/02/2010   HDL 46 12/02/2010   LDLCALC 114* 12/02/2010   TRIG 128 12/02/2010   BNP No  results found for: PROBNP Miscellaneous No results found for: DDIMER  Radiology/Studies:  Mm Digital Screening Bilateral  01/26/2015   CLINICAL DATA:  Screening.  EXAM: DIGITAL SCREENING BILATERAL MAMMOGRAM WITH CAD  COMPARISON:  Previous exam(s).  ACR Breast Density Category b: There are scattered areas of fibroglandular density.  FINDINGS: There are no findings suspicious for malignancy. Images were processed with CAD.  IMPRESSION: No mammographic evidence of malignancy. A result letter of this screening mammogram will be mailed directly to the patient.  RECOMMENDATION: Screening mammogram in one year. (Code:SM-B-01Y)  BI-RADS CATEGORY  1: Negative.   Electronically Signed   By: Hassan Rowan M.D.   On: 01/26/2015 14:28    EKG: Normal sinus rhythm at 70 Intervals 14/08/41 Biphasic T waves V2-V3 with poor  R-wave progression  Term monitor 2/16 demonstrated PVCs comprising 11% of the total burden. They appeared to be left bundle with a superior axis.   Assessment and Plan:   PVCs left bundle superior axis  Hypertension  Peripheral edema  Atrial fibrillation  Abnormal ECG  Her ECG is borderline abnormal with T-wave inversion in V2 and flat/inverted in lead V3. Her PVCs have a morphology with a superior axis and a left bundle suggesting a right ventricular floor origin. He was raised the specter of ARVC. We will obtain a signal average ECG as a screening tool. There is no interval worsening of LV systolic function not withstanding the high burden of PVCs noted on Holter monitoring. It has been demonstrated repeatedly that patients with asymptomatic PVCs are much more likely developed PVC-cardiomyopathy, the thinking being that there is a greater burden and hence greater risk. The fact that she is feeling much much better with fewer PVCs suggests that the removal of caffeine and other stimulants and the up titration of her beta blockers has been effective in mitigating the risk of PVC burden and  cardiomyopathy. We are left with the question as to underlying cause and a signal average ECG may be helpful looking for the presence or absence of a cardiomyopathic process.  I'm not sure as to whether her weight would preclude cardiac MR.  We discussed hypertension and the potential benefits of an ACE inhibitor versus her amlodipine which might be contributing to her edema, this   perhaps more likely because of the absence of jugular venous distention. She is not inclined however to take an ACE inhibitor as it has some "renal risk".  We discussed the use of the NOACs compared to Coumadin. We briefly reviewed the data of at least comparability in stroke prevention, bleeding and outcome. We discussed some of the new once wherein somewhat associated with decreased ischemic stroke risk, one to be taken daily, and has been shown to be comparable and bleeding risk to aspirin.  We also discussed bleeding associated with warfarin as well as NOACs and a wall bleeding as a complication of all these drugs intracranial bleeding is more frequently associated with warfarin then the NOACs and a GI bleeding is more commonly associated with the latter  She will look into whether she has drug coverage for NOACs.  I have recommended that she follow back up with Dr. Acie Fredrickson in one year's time with a repeat echo again to assess for evidence of interval damage to her LV function associated with her PVC burden. We will also do the signal average ECG as noted above.    Virl Axe

## 2015-02-12 NOTE — Patient Instructions (Signed)
Your physician recommends that you continue on your current medications as directed. Please refer to the Current Medication list given to you today.  Your physician has requested that you have an Signal average ECG.  Your physician has requested that you have an echocardiogram in one year. Echocardiography is a painless test that uses sound waves to create images of your heart. It provides your doctor with information about the size and shape of your heart and how well your heart's chambers and valves are working. This procedure takes approximately one hour. There are no restrictions for this procedure.  Your physician wants you to follow-up in: 1 year with Dr. Acie Fredrickson.  You will receive a reminder letter in the mail two months in advance. If you don't receive a letter, please call our office to schedule the follow-up appointment.

## 2015-02-15 ENCOUNTER — Ambulatory Visit (HOSPITAL_COMMUNITY)
Admission: RE | Admit: 2015-02-15 | Discharge: 2015-02-15 | Disposition: A | Discharge status: Home / Self Care | Payer: No Typology Code available for payment source | Source: Ambulatory Visit | Attending: Physician Assistant | Admitting: Physician Assistant

## 2015-02-15 DIAGNOSIS — R002 Palpitations: Secondary | ICD-10-CM | POA: Insufficient documentation

## 2015-02-15 DIAGNOSIS — I493 Ventricular premature depolarization: Secondary | ICD-10-CM

## 2015-02-15 DIAGNOSIS — R9431 Abnormal electrocardiogram [ECG] [EKG]: Secondary | ICD-10-CM | POA: Insufficient documentation

## 2015-03-05 ENCOUNTER — Ambulatory Visit (INDEPENDENT_AMBULATORY_CARE_PROVIDER_SITE_OTHER): Payer: No Typology Code available for payment source

## 2015-03-05 DIAGNOSIS — Z5181 Encounter for therapeutic drug level monitoring: Secondary | ICD-10-CM

## 2015-03-05 DIAGNOSIS — I4891 Unspecified atrial fibrillation: Secondary | ICD-10-CM

## 2015-03-05 DIAGNOSIS — Z7901 Long term (current) use of anticoagulants: Secondary | ICD-10-CM

## 2015-03-05 LAB — POCT INR: INR: 2.6

## 2015-04-02 ENCOUNTER — Ambulatory Visit (INDEPENDENT_AMBULATORY_CARE_PROVIDER_SITE_OTHER): Payer: No Typology Code available for payment source

## 2015-04-02 DIAGNOSIS — Z5181 Encounter for therapeutic drug level monitoring: Secondary | ICD-10-CM

## 2015-04-02 DIAGNOSIS — Z7901 Long term (current) use of anticoagulants: Secondary | ICD-10-CM

## 2015-04-02 DIAGNOSIS — I4891 Unspecified atrial fibrillation: Secondary | ICD-10-CM

## 2015-04-02 LAB — POCT INR: INR: 1.9

## 2015-04-26 ENCOUNTER — Other Ambulatory Visit: Payer: Self-pay | Admitting: *Deleted

## 2015-04-26 ENCOUNTER — Other Ambulatory Visit: Payer: Self-pay | Admitting: Cardiovascular Disease

## 2015-04-30 ENCOUNTER — Ambulatory Visit (INDEPENDENT_AMBULATORY_CARE_PROVIDER_SITE_OTHER): Payer: No Typology Code available for payment source | Admitting: *Deleted

## 2015-04-30 DIAGNOSIS — Z7901 Long term (current) use of anticoagulants: Secondary | ICD-10-CM

## 2015-04-30 DIAGNOSIS — Z5181 Encounter for therapeutic drug level monitoring: Secondary | ICD-10-CM

## 2015-04-30 DIAGNOSIS — I4891 Unspecified atrial fibrillation: Secondary | ICD-10-CM

## 2015-04-30 LAB — POCT INR: INR: 2.1

## 2015-05-28 ENCOUNTER — Ambulatory Visit (INDEPENDENT_AMBULATORY_CARE_PROVIDER_SITE_OTHER): Payer: No Typology Code available for payment source | Admitting: *Deleted

## 2015-05-28 DIAGNOSIS — Z5181 Encounter for therapeutic drug level monitoring: Secondary | ICD-10-CM

## 2015-05-28 DIAGNOSIS — Z7901 Long term (current) use of anticoagulants: Secondary | ICD-10-CM

## 2015-05-28 DIAGNOSIS — I4891 Unspecified atrial fibrillation: Secondary | ICD-10-CM

## 2015-05-28 LAB — POCT INR: INR: 2.6

## 2015-06-25 ENCOUNTER — Ambulatory Visit (INDEPENDENT_AMBULATORY_CARE_PROVIDER_SITE_OTHER): Payer: No Typology Code available for payment source | Admitting: *Deleted

## 2015-06-25 DIAGNOSIS — I4891 Unspecified atrial fibrillation: Secondary | ICD-10-CM

## 2015-06-25 DIAGNOSIS — Z7901 Long term (current) use of anticoagulants: Secondary | ICD-10-CM

## 2015-06-25 DIAGNOSIS — Z5181 Encounter for therapeutic drug level monitoring: Secondary | ICD-10-CM

## 2015-06-25 LAB — POCT INR: INR: 3

## 2015-08-06 ENCOUNTER — Ambulatory Visit (INDEPENDENT_AMBULATORY_CARE_PROVIDER_SITE_OTHER): Payer: No Typology Code available for payment source | Admitting: *Deleted

## 2015-08-06 DIAGNOSIS — Z7901 Long term (current) use of anticoagulants: Secondary | ICD-10-CM

## 2015-08-06 DIAGNOSIS — Z5181 Encounter for therapeutic drug level monitoring: Secondary | ICD-10-CM

## 2015-08-06 DIAGNOSIS — I4891 Unspecified atrial fibrillation: Secondary | ICD-10-CM

## 2015-08-06 LAB — POCT INR: INR: 3

## 2015-08-16 ENCOUNTER — Emergency Department (HOSPITAL_COMMUNITY)
Admission: EM | Admit: 2015-08-16 | Discharge: 2015-08-16 | Disposition: A | Discharge status: Home / Self Care | Payer: No Typology Code available for payment source | Attending: Emergency Medicine | Admitting: Emergency Medicine

## 2015-08-16 ENCOUNTER — Emergency Department (HOSPITAL_COMMUNITY): Payer: No Typology Code available for payment source

## 2015-08-16 ENCOUNTER — Encounter (HOSPITAL_COMMUNITY): Payer: Self-pay | Admitting: *Deleted

## 2015-08-16 DIAGNOSIS — E669 Obesity, unspecified: Secondary | ICD-10-CM | POA: Insufficient documentation

## 2015-08-16 DIAGNOSIS — Z9889 Other specified postprocedural states: Secondary | ICD-10-CM | POA: Insufficient documentation

## 2015-08-16 DIAGNOSIS — I119 Hypertensive heart disease without heart failure: Secondary | ICD-10-CM | POA: Insufficient documentation

## 2015-08-16 DIAGNOSIS — I480 Paroxysmal atrial fibrillation: Secondary | ICD-10-CM | POA: Insufficient documentation

## 2015-08-16 DIAGNOSIS — Z7901 Long term (current) use of anticoagulants: Secondary | ICD-10-CM | POA: Insufficient documentation

## 2015-08-16 DIAGNOSIS — R079 Chest pain, unspecified: Secondary | ICD-10-CM

## 2015-08-16 DIAGNOSIS — R110 Nausea: Secondary | ICD-10-CM

## 2015-08-16 DIAGNOSIS — I10 Essential (primary) hypertension: Secondary | ICD-10-CM | POA: Insufficient documentation

## 2015-08-16 DIAGNOSIS — M1990 Unspecified osteoarthritis, unspecified site: Secondary | ICD-10-CM | POA: Insufficient documentation

## 2015-08-16 DIAGNOSIS — R070 Pain in throat: Secondary | ICD-10-CM | POA: Insufficient documentation

## 2015-08-16 DIAGNOSIS — R1013 Epigastric pain: Secondary | ICD-10-CM

## 2015-08-16 DIAGNOSIS — Z79899 Other long term (current) drug therapy: Secondary | ICD-10-CM | POA: Insufficient documentation

## 2015-08-16 DIAGNOSIS — E785 Hyperlipidemia, unspecified: Secondary | ICD-10-CM | POA: Insufficient documentation

## 2015-08-16 DIAGNOSIS — R0602 Shortness of breath: Secondary | ICD-10-CM | POA: Insufficient documentation

## 2015-08-16 DIAGNOSIS — K219 Gastro-esophageal reflux disease without esophagitis: Secondary | ICD-10-CM | POA: Insufficient documentation

## 2015-08-16 LAB — CBC WITH DIFFERENTIAL/PLATELET
Basophils Absolute: 0.1 10*3/uL (ref 0.0–0.1)
Basophils Relative: 1 % (ref 0–1)
EOS ABS: 0.2 10*3/uL (ref 0.0–0.7)
Eosinophils Relative: 2 % (ref 0–5)
HEMATOCRIT: 40.1 % (ref 36.0–46.0)
HEMOGLOBIN: 13.2 g/dL (ref 12.0–15.0)
LYMPHS ABS: 4 10*3/uL (ref 0.7–4.0)
Lymphocytes Relative: 40 % (ref 12–46)
MCH: 27.7 pg (ref 26.0–34.0)
MCHC: 32.9 g/dL (ref 30.0–36.0)
MCV: 84.2 fL (ref 78.0–100.0)
MONO ABS: 0.6 10*3/uL (ref 0.1–1.0)
MONOS PCT: 6 % (ref 3–12)
NEUTROS ABS: 5.1 10*3/uL (ref 1.7–7.7)
NEUTROS PCT: 52 % (ref 43–77)
Platelets: 324 10*3/uL (ref 150–400)
RBC: 4.76 MIL/uL (ref 3.87–5.11)
RDW: 15.1 % (ref 11.5–15.5)
WBC: 9.8 10*3/uL (ref 4.0–10.5)

## 2015-08-16 LAB — I-STAT TROPONIN, ED: Troponin i, poc: 0 ng/mL (ref 0.00–0.08)

## 2015-08-16 LAB — BASIC METABOLIC PANEL
Anion gap: 11 (ref 5–15)
BUN: 10 mg/dL (ref 6–20)
CHLORIDE: 105 mmol/L (ref 101–111)
CO2: 24 mmol/L (ref 22–32)
CREATININE: 0.76 mg/dL (ref 0.44–1.00)
Calcium: 8.7 mg/dL — ABNORMAL LOW (ref 8.9–10.3)
GFR calc Af Amer: >60 mL/min (ref >60)
GFR calc non Af Amer: >60 mL/min (ref >60)
Glucose, Bld: 100 mg/dL — ABNORMAL HIGH (ref 65–99)
Potassium: 3.5 mmol/L (ref 3.5–5.1)
Sodium: 140 mmol/L (ref 135–145)

## 2015-08-16 LAB — PROTIME-INR
INR: 2.34 — ABNORMAL HIGH (ref 0.00–1.49)
Prothrombin Time: 25.4 seconds — ABNORMAL HIGH (ref 11.6–15.2)

## 2015-08-16 LAB — I-STAT CG4 LACTIC ACID, ED: LACTIC ACID, VENOUS: 1 mmol/L (ref 0.5–2.0)

## 2015-08-16 MED ORDER — ASPIRIN 81 MG PO CHEW
324.0000 mg | CHEWABLE_TABLET | Freq: Once | ORAL | Status: AC
  Administered 2015-08-16: 324 mg via ORAL
  Filled 2015-08-16: qty 4

## 2015-08-16 NOTE — Discharge Instructions (Signed)
Continue taking the Omeprazole as directed

## 2015-08-16 NOTE — ED Notes (Signed)
Pt states that she felt weak and tired and so she went to the fire dept. States she took a dose of amlodipine before she left home. States that her BP was high at the fire dept and so they called EMS.

## 2015-08-16 NOTE — ED Provider Notes (Signed)
CSN: [id number]     Arrival date & time 08/16/15  1644 History   First MD Initiated Contact with Patient 08/16/15 1645     Chief Complaint  Patient presents with  . Chest Pain     (Consider location/radiation/quality/duration/timing/severity/associated sxs/prior Treatment) HPI Comments: Patient with a history of Paroxysmal Atrial Fibrillation currently on Coumadin presents today with a chief complaint of pressure located in the epigastric area of her abdomen.  She states that she has been having the pressure intermittently for the past 3-4 days.  She has not noticed an association with food or exertion.  She states that she feels like the pain goes up into her throat.  No radiation to the back, jaw, or upper extremities.  Pain is associated with some nausea, but no vomiting.  She denies any chest pain or pressure at this time.  Also denies any abdominal pain or pressure at this time.  She does report some mild SOB when she experiences the pressure, but no SOB at this time.  She denies fever, chills, cough, hemoptysis, diarrhea, urinary symptoms, numbness, tingling, dizziness, or syncope.  Patient is a 59 y.o. female presenting with chest pain. The history is provided by the patient.  Chest Pain   Past Medical History  Diagnosis Date  . Hypertension   . A-fib   . History of hysterectomy   . Acid reflux   . Hyperlipidemia   . Fatigue   . Personal history of long-term (current) use of anticoagulants   . Dyslipidemia   . GERD (gastroesophageal reflux disease)   . Hypokalemia   . Personal history of hypertensive heart disease   . Obesity   . Arthritis   . Hx of echocardiogram     Echocardiogram (12/15): EF 55-60%, normal wall motion, PASP 32 mmHg   Past Surgical History  Procedure Laterality Date  . Partial hysterectomy    . Cardiac catheterization  04/15/2007    Est. EF of 50-55% -- Smooth and normal coronary arteries -- Normal left ventricular systolic function.  We will continue  with further treatment of her atrial fibrillation     . Abdominal hysterectomy     Family History  Problem Relation Age of Onset  . Coronary artery disease    . Hypertension    . Heart failure Cousin   . Heart attack Neg Hx   . Stroke Neg Hx   . Hypertension Son    Social History  Substance Use Topics  . Smoking status: Never Smoker   . Smokeless tobacco: None  . Alcohol Use: No   OB History    No data available     Review of Systems  Cardiovascular: Positive for chest pain.  All other systems reviewed and are negative.     Allergies  Lipitor  Home Medications   Prior to Admission medications   Medication Sig Start Date End Date Taking? Authorizing Provider  acetaminophen (TYLENOL ARTHRITIS PAIN) 650 MG CR tablet Take 1,300 mg by mouth 2 (two) times daily as needed for pain.    Historical Provider, MD  amLODipine (NORVASC) 5 MG tablet Take 1 tablet (5 mg total) by mouth 2 (two) times daily. 12/31/14   Liliane Shi, PA-C  B Complex-C (B-COMPLEX WITH VITAMIN C) tablet Take 1 tablet by mouth daily.    Historical Provider, MD  Cholecalciferol (VITAMIN D) 2000 UNITS CAPS Take 2,000 Units by mouth every evening.    Historical Provider, MD  Cinnamon 500 MG capsule Take 500 mg  by mouth daily.     Historical Provider, MD  metoprolol succinate (TOPROL-XL) 50 MG 24 hr tablet Take 1 tablet (50 mg) in the morning and 2 tablest (100 mg) in the evening.  Try to take these 12 hours apart. Take with or immediately following a meal. Patient taking differently: Take 2 tablets (100 mg) in the morning and 1 tablet (50 mg) in the evening.  Try to take these 12 hours apart. Take with or immediately following a meal. 12/31/14   Liliane Shi, PA-C  omeprazole (PRILOSEC) 20 MG capsule Take 40 mg by mouth daily.    Historical Provider, MD  rosuvastatin (CRESTOR) 10 MG tablet Take 10 mg by mouth at bedtime.     Historical Provider, MD  warfarin (COUMADIN) 5 MG tablet Take as directed by coumadin  clinic 04/26/15   Thayer Headings, MD   BP 119/64 mmHg  Pulse 79  Temp(Src) 98 F (36.7 C) (Oral)  Resp 23  Ht 5\' 5"  (1.651 m)  Wt 260 lb (117.935 kg)  BMI 43.27 kg/m2  SpO2 97% Physical Exam  Constitutional: She appears well-developed and well-nourished. No distress.  HENT:  Head: Normocephalic and atraumatic.  Mouth/Throat: Oropharynx is clear and moist.  Neck: Normal range of motion. Neck supple.  Cardiovascular: Normal rate, regular rhythm, normal heart sounds and intact distal pulses.   Pulses:      Dorsalis pedis pulses are 2+ on the right side, and 2+ on the left side.  Pulmonary/Chest: Effort normal and breath sounds normal. She exhibits no tenderness.  Abdominal: Soft. Bowel sounds are normal. She exhibits no distension and no mass. There is no tenderness. There is no rebound, no guarding and negative Murphy's sign.  Musculoskeletal: Normal range of motion.  No LE edema or erythema bilaterally  Neurological: She is alert.  Skin: Skin is warm and dry. She is not diaphoretic.  Psychiatric: She has a normal mood and affect.  Nursing note and vitals reviewed.   ED Course  Procedures (including critical care time) Labs Review Labs Reviewed  CBC WITH DIFFERENTIAL/PLATELET  BASIC METABOLIC PANEL  PROTIME-INR  Randolm Idol, ED    Imaging Review Dg Chest 2 View  08/16/2015   CLINICAL DATA:  Chest pain for 3 days  EXAM: CHEST  2 VIEW  COMPARISON:  01/01/2015  FINDINGS: Cardiomediastinal silhouette is stable. No acute infiltrate or pleural effusion. No pulmonary edema. Mild degenerative changes thoracic spine.  IMPRESSION: No active cardiopulmonary disease.   Electronically Signed   By: Lahoma Crocker M.D.   On: 08/16/2015 17:29   I have personally reviewed and evaluated these images and lab results as part of my medical decision-making.   EKG Interpretation   Date/Time:  Monday August 16 2015 16:57:51 EDT Ventricular Rate:  76 PR Interval:  135 QRS Duration: 90 QT  Interval:  418 QTC Calculation: 470 R Axis:   -31 Text Interpretation:  Sinus rhythm Left axis deviation No significant  change since last tracing Confirmed by ZACKOWSKI  MD, SCOTT [phone]) on  08/16/2015 5:00:40 PM      MDM   Final diagnoses:  Chest pain   Patient presents today with a chief complaint of intermittent epigastric abdominal pain that has been present for the past 3-4 days.  Abdomen non tender to palpation at this time.  She reports that the pain radiates into her throat.  She does have a history of GERD and is currently on Omeprazole.  She states that she is supposed to  be taking the Omeprazole twice daily, but has only been taking it once a day.  No ischemic changes on EKG.  Troponin negative.  CXR negative.  Patient has been pain free during ED course.  Feel that the patient is stable for discharge.  Return precautions given.    Hyman Bible, PA-C 08/19/15 [place], MD 08/20/15 [phone]

## 2015-08-16 NOTE — ED Notes (Signed)
[place] [facility] via [facility] with epigastric pain onset x 3 days, pt takes Warfarin & was told by PCP not to take ASA, pt hx of A fib, pt denies radiation of pain, c/o nausea with pain, denies v/d, pt A&O x4, follows commands, speaks in complete sentences, NAD

## 2015-08-26 ENCOUNTER — Other Ambulatory Visit: Payer: Self-pay | Admitting: Cardiovascular Disease

## 2015-09-17 ENCOUNTER — Ambulatory Visit (INDEPENDENT_AMBULATORY_CARE_PROVIDER_SITE_OTHER): Payer: No Typology Code available for payment source

## 2015-09-17 DIAGNOSIS — Z7901 Long term (current) use of anticoagulants: Secondary | ICD-10-CM

## 2015-09-17 DIAGNOSIS — Z5181 Encounter for therapeutic drug level monitoring: Secondary | ICD-10-CM

## 2015-09-17 DIAGNOSIS — I4891 Unspecified atrial fibrillation: Secondary | ICD-10-CM

## 2015-09-17 LAB — POCT INR: INR: 2.5

## 2015-10-29 ENCOUNTER — Ambulatory Visit (INDEPENDENT_AMBULATORY_CARE_PROVIDER_SITE_OTHER): Payer: No Typology Code available for payment source | Admitting: *Deleted

## 2015-10-29 DIAGNOSIS — Z7901 Long term (current) use of anticoagulants: Secondary | ICD-10-CM

## 2015-10-29 DIAGNOSIS — Z5181 Encounter for therapeutic drug level monitoring: Secondary | ICD-10-CM

## 2015-10-29 DIAGNOSIS — I4891 Unspecified atrial fibrillation: Secondary | ICD-10-CM

## 2015-10-29 LAB — POCT INR: INR: 2.5

## 2015-12-10 ENCOUNTER — Ambulatory Visit (INDEPENDENT_AMBULATORY_CARE_PROVIDER_SITE_OTHER): Payer: No Typology Code available for payment source | Admitting: *Deleted

## 2015-12-10 ENCOUNTER — Encounter: Payer: Self-pay | Admitting: *Deleted

## 2015-12-10 DIAGNOSIS — Z7901 Long term (current) use of anticoagulants: Secondary | ICD-10-CM

## 2015-12-10 DIAGNOSIS — I4891 Unspecified atrial fibrillation: Secondary | ICD-10-CM

## 2015-12-10 DIAGNOSIS — Z5181 Encounter for therapeutic drug level monitoring: Secondary | ICD-10-CM

## 2015-12-10 LAB — POCT INR: INR: 2.1

## 2015-12-15 ENCOUNTER — Other Ambulatory Visit: Payer: Self-pay | Admitting: Cardiovascular Disease

## 2015-12-21 ENCOUNTER — Other Ambulatory Visit: Payer: Self-pay

## 2015-12-21 DIAGNOSIS — Z1231 Encounter for screening mammogram for malignant neoplasm of breast: Secondary | ICD-10-CM

## 2016-01-10 ENCOUNTER — Encounter (HOSPITAL_COMMUNITY): Payer: Self-pay | Admitting: *Deleted

## 2016-01-10 ENCOUNTER — Other Ambulatory Visit (HOSPITAL_COMMUNITY): Payer: No Typology Code available for payment source

## 2016-01-10 ENCOUNTER — Ambulatory Visit (HOSPITAL_COMMUNITY): Payer: No Typology Code available for payment source | Attending: Cardiovascular Disease

## 2016-01-10 ENCOUNTER — Other Ambulatory Visit: Payer: Self-pay

## 2016-01-10 DIAGNOSIS — I493 Ventricular premature depolarization: Secondary | ICD-10-CM | POA: Insufficient documentation

## 2016-01-10 DIAGNOSIS — I480 Paroxysmal atrial fibrillation: Secondary | ICD-10-CM | POA: Insufficient documentation

## 2016-01-10 DIAGNOSIS — I517 Cardiomegaly: Secondary | ICD-10-CM | POA: Insufficient documentation

## 2016-01-10 DIAGNOSIS — I10 Essential (primary) hypertension: Secondary | ICD-10-CM | POA: Insufficient documentation

## 2016-01-10 DIAGNOSIS — Z6841 Body Mass Index (BMI) 40.0 and over, adult: Secondary | ICD-10-CM | POA: Insufficient documentation

## 2016-01-10 DIAGNOSIS — E669 Obesity, unspecified: Secondary | ICD-10-CM | POA: Insufficient documentation

## 2016-01-10 DIAGNOSIS — E785 Hyperlipidemia, unspecified: Secondary | ICD-10-CM | POA: Insufficient documentation

## 2016-01-14 ENCOUNTER — Telehealth: Payer: Self-pay | Admitting: *Deleted

## 2016-01-14 NOTE — Telephone Encounter (Signed)
Pt has been notified of echo results by phone with verbal understanding 

## 2016-01-21 ENCOUNTER — Ambulatory Visit (INDEPENDENT_AMBULATORY_CARE_PROVIDER_SITE_OTHER): Payer: No Typology Code available for payment source | Admitting: *Deleted

## 2016-01-21 DIAGNOSIS — I4891 Unspecified atrial fibrillation: Secondary | ICD-10-CM

## 2016-01-21 DIAGNOSIS — Z7901 Long term (current) use of anticoagulants: Secondary | ICD-10-CM

## 2016-01-21 DIAGNOSIS — Z5181 Encounter for therapeutic drug level monitoring: Secondary | ICD-10-CM

## 2016-01-21 LAB — POCT INR: INR: 2.2

## 2016-01-26 ENCOUNTER — Encounter: Payer: Self-pay | Admitting: Cardiovascular Disease

## 2016-01-26 ENCOUNTER — Ambulatory Visit (INDEPENDENT_AMBULATORY_CARE_PROVIDER_SITE_OTHER): Payer: No Typology Code available for payment source | Admitting: Cardiovascular Disease

## 2016-01-26 VITALS — BP 130/70 | HR 72 | Ht 65.0 in | Wt 259.4 lb

## 2016-01-26 DIAGNOSIS — I493 Ventricular premature depolarization: Secondary | ICD-10-CM

## 2016-01-26 DIAGNOSIS — I10 Essential (primary) hypertension: Secondary | ICD-10-CM

## 2016-01-26 DIAGNOSIS — I480 Paroxysmal atrial fibrillation: Secondary | ICD-10-CM

## 2016-01-26 DIAGNOSIS — I4891 Unspecified atrial fibrillation: Secondary | ICD-10-CM

## 2016-01-26 MED ORDER — METOPROLOL TARTRATE 50 MG PO TABS: 50.0000 mg | ORAL_TABLET | Freq: Two times a day (BID) | ORAL | Status: DC

## 2016-01-26 MED ORDER — AMLODIPINE BESYLATE 5 MG PO TABS: 5.0000 mg | ORAL_TABLET | Freq: Two times a day (BID) | ORAL | Status: DC

## 2016-01-26 NOTE — Patient Instructions (Addendum)
Medication Instructions:  Your medication regimen is working well We have changed your prescription from Metoprolol Succinate (Toprol XL) to Metoprolol Tartrate (Lopressor)   Labwork: None Ordered   Testing/Procedures: None Ordered   Follow-Up: Your physician wants you to follow-up in: 1 year with  Dr. Acie Fredrickson.  You will receive a reminder letter in the mail two months in advance. If you don't receive a letter, please call our office to schedule the follow-up appointment.   If you need a refill on your cardiac medications before your next appointment, please call your pharmacy.   Thank you for choosing [facility]! Christen Bame, RN [phone]

## 2016-01-26 NOTE — Progress Notes (Signed)
[address] [place], [place]  [address] Phone: [phone] Fax:  [phone]  Date:  01/26/2016   ID:  Hillarie, Harrigan 1956-09-20, MRN [id number]  PCP:  [facility].  Problem list: 1. Hypertension 2. Hyperlipidemia 3. Paroxysmal atrial fibrillation 4. Obesity 5. Obstructive sleep apnea 6. PVCs      History of Present Illness: Jacqueline Petersen is a 60 y.o. female with a hx of HTN, HL, paroxysmal atrial fibrillation previously on Coumadin, GERD and obesity. LHC (03/2007): Normal coronary arteries, EF 50-55%. Patient was recently admitted 12/10-12/13 after presenting with palpitations. She was found to be in atrial fibrillation with RVR. She converted to NSR on IV diltiazem. It was felt that she needed long-term anticoagulation. CHADS2-VASc=2.  She was placed on Coumadin. Echocardiogram (12/04/2013): Moderate LVH, EF 50-55%, normal wall motion.  She has generally been doing okay since discharge. She feels fatigued. She's had occasional palpitations. These are fairly consistent with what she has had in the past. She denies any prolonged rapid palpitations. She denies chest pain, dyspnea, syncope, orthopnea, PND or edema. She is compliant with her CPAP.   March 12, 2014: Ms. Ayars has a hx of AF in the remote past.   She has normal coronaries.  She was admitted with Afib- and converted with dilt drip.  She continues to have problems with HTN.  She complains about pain and firmness in her left leg.  She has continued to gain weight He has a history of sleep apnea. She does not look like wearing her CPAP mask because it is uncomfortable at night.  Sept. 16, 2015:  Pt is doing well.  I have met her in the past.  She was seen by [name] in March for follow up for her a-fib.  Still eating salt.- diastolic BP is still elevated.  Unable to exercise due to leg pain  Nov. 24, 2015:  Mr. Wileman is seen back today for follow up of her  HTN. We added Amlodipine at her last visit.  She has not had any further episodes of atrial fib. She has lots of pain in her knees.  Is scheduled to see an orthopedist soon.  Feb. 1, 2017:  She has  Seen Dr. Caryl Comes since I last seen her. She has significant number of premature ventricular contractions. A 24-hour Holter revealed 11% PVC burden. These have greatly improved when she limited caffeine from her diet.  Signal average ECG was normal   Repeat echocardiogram reveals normal left ventricular systolic function. She is doing well.   No dyspnea, no CP  Her CPAP is not working.    She needs to have right knee replacement .      Wt Readings from Last 3 Encounters:  01/26/16 259 lb 6.4 oz (117.663 kg)  08/16/15 260 lb (117.935 kg)  02/12/15 258 lb 9.6 oz (117.3 kg)     Past Medical History  Diagnosis Date  . Hypertension   . A-fib ([place])   . History of hysterectomy   . Acid reflux   . Hyperlipidemia   . Fatigue   . Personal history of long-term (current) use of anticoagulants   . Dyslipidemia   . GERD (gastroesophageal reflux disease)   . Hypokalemia   . Personal history of hypertensive heart disease   . Obesity   . Arthritis   . Hx of echocardiogram     Echocardiogram (12/15): EF 55-60%, normal wall motion, PASP 32 mmHg    Current  Outpatient Prescriptions  Medication Sig Dispense Refill  . acetaminophen (TYLENOL ARTHRITIS PAIN) 650 MG CR tablet Take 1,300 mg by mouth 2 (two) times daily as needed for pain.    Marland Kitchen amLODipine (NORVASC) 5 MG tablet Take 1 tablet (5 mg total) by mouth 2 (two) times daily. 60 tablet 11  . B Complex-C (B-COMPLEX WITH VITAMIN C) tablet Take 1 tablet by mouth daily.    . Cholecalciferol (VITAMIN D) 2000 UNITS CAPS Take 2,000 Units by mouth every evening.    . Cinnamon 500 MG capsule Take 500 mg by mouth daily.     . metoprolol succinate (TOPROL-XL) 50 MG 24 hr tablet Take 1 tablet (50 mg) in the morning and 2 tablest (100 mg) in the evening.  Try  to take these 12 hours apart. Take with or immediately following a meal. 90 tablet 11  . omeprazole (PRILOSEC) 20 MG capsule Take 40 mg by mouth daily.    . rosuvastatin (CRESTOR) 10 MG tablet Take 10 mg by mouth at bedtime.     Marland Kitchen warfarin (COUMADIN) 5 MG tablet TAKE AS DIRECTED BY COUMADIN [facility] 90 tablet 0  . metoprolol succinate (TOPROL-XL) 100 MG 24 hr tablet Take 100 mg by mouth at bedtime. Reported on 01/26/2016     No current facility-administered medications for this visit.    Allergies:   Lipitor   Social History:  The patient  reports that she has never smoked. She does not have any smokeless tobacco history on file. She reports that she does not drink alcohol or use illicit drugs.   Family History:  The patient's family history includes Heart failure in her cousin; Hypertension in her son. There is no history of Heart attack or Stroke.   ROS:  Please see the history of present illness.   She denies any bleeding problems. She has had some recent URI symptoms. She denies fever.   All other systems reviewed and negative.   PHYSICAL EXAM: VS:  BP 130/70 mmHg  Pulse 72  Ht 5' 5" (1.651 m)  Wt 259 lb 6.4 oz (117.663 kg)  BMI 43.17 kg/m2 Well nourished, well developed, in no acute distress HEENT: normal Neck: no JVD Cardiac:  normal S1, S2; RRR; no murmur Lungs:  clear to auscultation bilaterally, no wheezing, rhonchi or rales Abd: soft, nontender, no hepatomegaly Ext: no edema Skin: warm and dry Neuro:  CNs 2-12 intact, no focal abnormalities noted  EKG:   ASSESSMENT AND PLAN:  1. Hypertension - BP is stable ,   2. Hyperlipidemia - managed by her medical doctor   3. Paroxysmal atrial fibrillation - in NSR   4. Obesity - she needs to lose weight ,  5. Obstructive sleep apnea - CPAP machine is currently broken,   She will ask Dr. Nancee Liter office about this  6. PVCs  - continue metoprolol 50 BID     Nahser, Wonda Cheng, MD  01/26/2016 3:35 PM    [facility] [place],  [place], [place]  [address] Pager [phone] Phone: [phone]; Fax: [phone]   [facility]  [address] [place],   [address] [phone]   Fax [phone]

## 2016-01-31 ENCOUNTER — Ambulatory Visit
Admission: RE | Admit: 2016-01-31 | Discharge: 2016-01-31 | Disposition: A | Discharge status: Home / Self Care | Payer: No Typology Code available for payment source | Source: Ambulatory Visit

## 2016-01-31 DIAGNOSIS — Z1231 Encounter for screening mammogram for malignant neoplasm of breast: Secondary | ICD-10-CM

## 2016-03-03 ENCOUNTER — Ambulatory Visit (INDEPENDENT_AMBULATORY_CARE_PROVIDER_SITE_OTHER): Payer: No Typology Code available for payment source | Admitting: Surgery

## 2016-03-03 DIAGNOSIS — Z7901 Long term (current) use of anticoagulants: Secondary | ICD-10-CM

## 2016-03-03 DIAGNOSIS — Z5181 Encounter for therapeutic drug level monitoring: Secondary | ICD-10-CM

## 2016-03-03 DIAGNOSIS — I480 Paroxysmal atrial fibrillation: Secondary | ICD-10-CM

## 2016-03-03 DIAGNOSIS — I4891 Unspecified atrial fibrillation: Secondary | ICD-10-CM

## 2016-03-03 LAB — POCT INR: INR: 1.7

## 2016-03-17 ENCOUNTER — Ambulatory Visit (INDEPENDENT_AMBULATORY_CARE_PROVIDER_SITE_OTHER): Payer: No Typology Code available for payment source | Admitting: *Deleted

## 2016-03-17 DIAGNOSIS — I480 Paroxysmal atrial fibrillation: Secondary | ICD-10-CM

## 2016-03-17 DIAGNOSIS — Z5181 Encounter for therapeutic drug level monitoring: Secondary | ICD-10-CM

## 2016-03-17 DIAGNOSIS — Z7901 Long term (current) use of anticoagulants: Secondary | ICD-10-CM

## 2016-03-17 DIAGNOSIS — I4891 Unspecified atrial fibrillation: Secondary | ICD-10-CM

## 2016-03-17 LAB — POCT INR: INR: 2.1

## 2016-04-14 ENCOUNTER — Ambulatory Visit (INDEPENDENT_AMBULATORY_CARE_PROVIDER_SITE_OTHER): Payer: No Typology Code available for payment source | Admitting: Surgery

## 2016-04-14 DIAGNOSIS — Z7901 Long term (current) use of anticoagulants: Secondary | ICD-10-CM

## 2016-04-14 DIAGNOSIS — Z5181 Encounter for therapeutic drug level monitoring: Secondary | ICD-10-CM

## 2016-04-14 DIAGNOSIS — I480 Paroxysmal atrial fibrillation: Secondary | ICD-10-CM

## 2016-04-14 DIAGNOSIS — I4891 Unspecified atrial fibrillation: Secondary | ICD-10-CM

## 2016-04-14 LAB — POCT INR: INR: 2.1

## 2016-04-16 ENCOUNTER — Other Ambulatory Visit: Payer: Self-pay | Admitting: Cardiovascular Disease

## 2016-05-26 ENCOUNTER — Ambulatory Visit (INDEPENDENT_AMBULATORY_CARE_PROVIDER_SITE_OTHER): Payer: No Typology Code available for payment source | Admitting: *Deleted

## 2016-05-26 DIAGNOSIS — I4891 Unspecified atrial fibrillation: Secondary | ICD-10-CM

## 2016-05-26 DIAGNOSIS — I480 Paroxysmal atrial fibrillation: Secondary | ICD-10-CM

## 2016-05-26 DIAGNOSIS — Z7901 Long term (current) use of anticoagulants: Secondary | ICD-10-CM

## 2016-05-26 DIAGNOSIS — Z5181 Encounter for therapeutic drug level monitoring: Secondary | ICD-10-CM

## 2016-05-26 LAB — POCT INR: INR: 3

## 2016-06-20 ENCOUNTER — Telehealth: Payer: Self-pay | Admitting: *Deleted

## 2016-06-20 ENCOUNTER — Telehealth: Payer: Self-pay | Admitting: Cardiovascular Disease

## 2016-06-20 ENCOUNTER — Other Ambulatory Visit: Payer: Self-pay

## 2016-06-20 DIAGNOSIS — I10 Essential (primary) hypertension: Secondary | ICD-10-CM

## 2016-06-20 DIAGNOSIS — I4891 Unspecified atrial fibrillation: Secondary | ICD-10-CM

## 2016-06-20 MED ORDER — METOPROLOL TARTRATE 50 MG PO TABS: 50.0000 mg | ORAL_TABLET | Freq: Two times a day (BID) | ORAL | Status: DC

## 2016-06-20 MED ORDER — AMLODIPINE BESYLATE 5 MG PO TABS: 5.0000 mg | ORAL_TABLET | Freq: Two times a day (BID) | ORAL | Status: DC

## 2016-06-20 NOTE — Telephone Encounter (Signed)
Received walk in form requesting [facility] and sent to [facility] dept. Spoke with patient and made her aware that she should let [facility] know and they can be transferred to the health dept. Patient verbalized understanding and thanked me for the call.

## 2016-06-20 NOTE — Telephone Encounter (Signed)
Walk in refill rqst received. Rx sent to [facility] as requested by the pt. For Metoprolol (Lopressor) 50mg  bid #180 R-1 Amlodipine 5mg  bid #180 R-1

## 2016-06-20 NOTE — Telephone Encounter (Signed)
Walk In Pt form-pt needs refills on medicine-gave to Refill department

## 2016-07-07 ENCOUNTER — Ambulatory Visit (INDEPENDENT_AMBULATORY_CARE_PROVIDER_SITE_OTHER): Payer: Medicaid Other | Admitting: *Deleted

## 2016-07-07 DIAGNOSIS — I480 Paroxysmal atrial fibrillation: Secondary | ICD-10-CM | POA: Diagnosis not present

## 2016-07-07 DIAGNOSIS — I4891 Unspecified atrial fibrillation: Secondary | ICD-10-CM | POA: Diagnosis not present

## 2016-07-07 DIAGNOSIS — Z5181 Encounter for therapeutic drug level monitoring: Secondary | ICD-10-CM | POA: Diagnosis not present

## 2016-07-07 DIAGNOSIS — Z7901 Long term (current) use of anticoagulants: Secondary | ICD-10-CM

## 2016-07-07 LAB — POCT INR: INR: 1.9

## 2016-07-10 MED FILL — SIMVASTATIN 40 MG TABLET: 40 | 30 days supply | Qty: 30 | Fill #0

## 2016-07-26 MED FILL — WARFARIN SODIUM 5 MG TABLET: 5 | 30 days supply | Qty: 30 | Fill #0

## 2016-08-01 MED FILL — ROSUVASTATIN CALCIUM 10 MG: 10 | 30 days supply | Qty: 30 | Fill #0

## 2016-08-14 MED FILL — METOPROLOL TARTRATE 50 MG T: 50 | 30 days supply | Qty: 60 | Fill #0

## 2016-08-18 ENCOUNTER — Encounter (INDEPENDENT_AMBULATORY_CARE_PROVIDER_SITE_OTHER): Payer: Self-pay

## 2016-08-18 ENCOUNTER — Ambulatory Visit (INDEPENDENT_AMBULATORY_CARE_PROVIDER_SITE_OTHER): Payer: Medicaid Other | Admitting: *Deleted

## 2016-08-18 ENCOUNTER — Telehealth: Payer: Self-pay | Admitting: Nurse Practitioner

## 2016-08-18 ENCOUNTER — Telehealth: Payer: Self-pay | Admitting: Cardiovascular Disease

## 2016-08-18 DIAGNOSIS — Z7901 Long term (current) use of anticoagulants: Secondary | ICD-10-CM | POA: Diagnosis not present

## 2016-08-18 DIAGNOSIS — Z5181 Encounter for therapeutic drug level monitoring: Secondary | ICD-10-CM | POA: Diagnosis not present

## 2016-08-18 DIAGNOSIS — I4891 Unspecified atrial fibrillation: Secondary | ICD-10-CM

## 2016-08-18 LAB — POCT INR: INR: 2.6

## 2016-08-18 MED ORDER — CARVEDILOL 12.5 MG PO TABS: 12.5000 mg | ORAL_TABLET | Freq: Two times a day (BID) | ORAL | 11 refills | Status: DC

## 2016-08-18 MED FILL — CARVEDILOL 12.5 MG TABLET: 12.5 | 30 days supply | Qty: 60 | Fill #0

## 2016-08-18 NOTE — Telephone Encounter (Signed)
Spoke with patient who states she feels very tired on Lopressor 50 mg.  She states prior to February she took Toprol XL 100 mg at night and went to bed so she did not notice fatigue. She states she went to fire station today and heart rate was 80 bpm. She states she has been taking lopressor only at night because when she takes the morning dose she feels her heart flutter.  I advised that if she decreases her metoprolol dose that she will likely note more arrhythmias.  She states she has better insurance coverage than she did previously so she can try a non-generic.  I advised I will route message to Dr. Acie Fredrickson for advice and call her back.  She verbalized understanding and agreement.

## 2016-08-18 NOTE — Telephone Encounter (Signed)
Walk in pt form-questions about medication please call-gave to [facility].

## 2016-08-18 NOTE — Telephone Encounter (Signed)
She may try the Toprol XL 100 mg a day if she thinks that she did better on that.

## 2016-08-18 NOTE — Telephone Encounter (Signed)
Spoke with patient who states Toprol did not work well for her either.  She would like to try a different agent.  I spoke with Dr. Acie Fredrickson who advised she switch to Carvedilol 12.5 mg twice daily.  Patient verbalized understanding and agreement and requests Rx be sent to [facility].  She thanked me for the call.

## 2016-08-24 ENCOUNTER — Telehealth: Payer: Self-pay | Admitting: *Deleted

## 2016-08-24 MED ORDER — METOPROLOL TARTRATE 25 MG PO TABS: 25.0000 mg | ORAL_TABLET | Freq: Two times a day (BID) | ORAL | 11 refills | Status: DC

## 2016-08-24 NOTE — Telephone Encounter (Signed)
Spoke with patient who states since starting Carvedilol 12.5 mg bid, she has been urinating frequently, feeling washed out, and dizzy.  She states she cannot walk without stumbling.  She took 1/2 dose today and states symptoms were no better.  She does not want to try carvedilol 6.25 mg for a few more days to see if symptoms improve. I asked about hydration and she says that she drinks some water but cannot tolerate a lot of water right now with the problem of increased urination on carvedilol. She requests to restart metoprolol (lopressor) 25 mg bid. I advised that she may d/c carvedilol and restart lopressor 25 mg bid.  I scheduled her to see Dr. Acie Fredrickson next week to try to resolve this issue and find out what she can tolerate long term.  She verbalized understanding and agreement and thanked me for the call.

## 2016-08-24 NOTE — Telephone Encounter (Signed)
Patient is experiencing dizziness and fatigue on new medication and wants to know what else can she take or do. She wants to know can she go back on her old medication and take half of half. She would like a call back as soon as possible to know what to do. Will forward message to both Dr. Acie Fredrickson and Sharyn Lull.

## 2016-08-25 NOTE — Telephone Encounter (Signed)
Agree with note by Michelle Swinyer, RN  

## 2016-08-29 ENCOUNTER — Encounter: Payer: Self-pay | Admitting: Cardiovascular Disease

## 2016-08-30 ENCOUNTER — Ambulatory Visit (INDEPENDENT_AMBULATORY_CARE_PROVIDER_SITE_OTHER): Payer: Medicaid Other | Admitting: Cardiovascular Disease

## 2016-08-30 ENCOUNTER — Encounter (INDEPENDENT_AMBULATORY_CARE_PROVIDER_SITE_OTHER): Payer: Self-pay

## 2016-08-30 ENCOUNTER — Encounter: Payer: Self-pay | Admitting: Cardiovascular Disease

## 2016-08-30 VITALS — BP 140/80 | HR 69 | Ht 65.0 in | Wt 257.2 lb

## 2016-08-30 DIAGNOSIS — I493 Ventricular premature depolarization: Secondary | ICD-10-CM

## 2016-08-30 DIAGNOSIS — I10 Essential (primary) hypertension: Secondary | ICD-10-CM

## 2016-08-30 DIAGNOSIS — I480 Paroxysmal atrial fibrillation: Secondary | ICD-10-CM

## 2016-08-30 NOTE — Patient Instructions (Signed)
Medication Instructions:  Your physician recommends that you continue on your current medications as directed. Please refer to the Current Medication list given to you today.   Labwork: None Ordered   Testing/Procedures: None Ordered   Follow-Up: Your physician wants you to follow-up in: 6 months with Dr. Nahser.  You will receive a reminder letter in the mail two months in advance. If you don't receive a letter, please call our office to schedule the follow-up appointment.   If you need a refill on your cardiac medications before your next appointment, please call your pharmacy.   Thank you for choosing [facility]! Katrice Goel, RN [phone]    

## 2016-08-30 NOTE — Progress Notes (Signed)
[address], [place], [place]  [address] Phone: [phone] Fax:  [phone]  Date:  08/30/2016   ID:  Jacqueline Petersen [birth year], MRN [id number]  PCP:  [facility].  Problem list: 1. Hypertension 2. Hyperlipidemia 3. Paroxysmal atrial fibrillation 4. Obesity 5. Obstructive sleep apnea 6. PVCs      History of Present Illness: Jacqueline Petersen is a 60 y.o. female with a hx of HTN, HL, paroxysmal atrial fibrillation previously on Coumadin, GERD and obesity. LHC (03/2007): Normal coronary arteries, EF 50-55%. Patient was recently admitted 12/10-12/13 after presenting with palpitations. She was found to be in atrial fibrillation with RVR. She converted to NSR on IV diltiazem. It was felt that she needed long-term anticoagulation. CHADS2-VASc=2.  She was placed on Coumadin. Echocardiogram (12/04/2013): Moderate LVH, EF 50-55%, normal wall motion.  She has generally been doing okay since discharge. She feels fatigued. She's had occasional palpitations. These are fairly consistent with what she has had in the past. She denies any prolonged rapid palpitations. She denies chest pain, dyspnea, syncope, orthopnea, PND or edema. She is compliant with her CPAP.   March 12, 2014: Jacqueline Petersen has a hx of AF in the remote past.   She has normal coronaries.  She was admitted with Afib- and converted with dilt drip.  She continues to have problems with HTN.  She complains about pain and firmness in her left leg.  She has continued to gain weight He has a history of sleep apnea. She does not look like wearing her CPAP mask because it is uncomfortable at night.  Sept. 16, 2015:  Jacqueline Petersen is doing well.  I have met her in the past.  She was seen by Jacqueline Petersen in March for follow up for her a-fib.  Still eating salt.- diastolic BP is still elevated.  Unable to exercise due to leg pain  Nov. 24, 2015:  Jacqueline Petersen is seen back today for follow up of her  HTN. We added Amlodipine at her last visit.  She has not had any further episodes of atrial fib. She has lots of pain in her knees.  Is scheduled to see an orthopedist soon.  Feb. 1, 2017:  She has  Seen Jacqueline Petersen since I last seen her. She has significant number of premature ventricular contractions. A 24-hour Holter revealed 11% PVC burden. These have greatly improved when she limited caffeine from her diet.  Signal average ECG was normal   Repeat echocardiogram reveals normal left ventricular systolic function. She is doing well.   No dyspnea, no CP  Her CPAP is not working.    She needs to have right knee replacement .     Sept. 6, 2017:  Doing well.  No further palpitations  - better since she is avoiding caffiene.    Wt Readings from Last 3 Encounters:  08/30/16 257 lb 3.2 oz (116.7 kg)  01/26/16 259 lb 6.4 oz (117.7 kg)  08/16/15 260 lb (117.9 kg)     Past Medical History:  Diagnosis Date  . A-fib ([place])   . Acid reflux   . Arthritis   . Dyslipidemia   . Fatigue   . GERD (gastroesophageal reflux disease)   . History of hysterectomy   . Hx of echocardiogram    Echocardiogram (12/15): EF 55-60%, normal wall motion, PASP 32 mmHg  . Hyperlipidemia   . Hypertension   . Hypokalemia   . Obesity   . Personal history of  hypertensive heart disease   . Personal history of long-term (current) use of anticoagulants     Current Outpatient Prescriptions  Medication Sig Dispense Refill  . acetaminophen (TYLENOL ARTHRITIS PAIN) 650 MG CR tablet Take 1,300 mg by mouth 2 (two) times daily as needed for pain.    Marland Kitchen amLODipine (NORVASC) 5 MG tablet Take 1 tablet (5 mg total) by mouth 2 (two) times daily. 180 tablet 1  . B Complex-C (B-COMPLEX WITH VITAMIN C) tablet Take 1 tablet by mouth daily.    . Cholecalciferol (VITAMIN D) 2000 UNITS CAPS Take 2,000 Units by mouth every evening.    . Cinnamon 500 MG capsule Take 500 mg by mouth daily.     . metoprolol tartrate (LOPRESSOR) 25  MG tablet Take 1 tablet (25 mg total) by mouth 2 (two) times daily. 60 tablet 11  . omeprazole (PRILOSEC) 20 MG capsule Take 40 mg by mouth daily.    . rosuvastatin (CRESTOR) 10 MG tablet Take 10 mg by mouth at bedtime.     Marland Kitchen warfarin (COUMADIN) 5 MG tablet TAKE AS DIRECTED BY COUMADIN CLINIC. 90 tablet 1   No current facility-administered medications for this visit.     Allergies:   Lipitor [atorvastatin calcium]   Social History:  The patient  reports that she has never smoked. She has never used smokeless tobacco. She reports that she does not drink alcohol or use drugs.   Family History:  The patient's family history includes Heart failure in her cousin; Hypertension in her son.   ROS:  Please see the history of present illness.   She denies any bleeding problems. She has had some recent URI symptoms. She denies fever.   All other systems reviewed and negative.   PHYSICAL EXAM: VS:  BP 140/80 (BP Location: Left Arm, Patient Position: Sitting, Cuff Size: Large)   Pulse 69   Ht 5' 5"  (1.651 m)   Wt 257 lb 3.2 oz (116.7 kg)   BMI 42.80 kg/m  Well nourished, well developed, in no acute distress  HEENT: normal  Neck: no JVD  Cardiac:  normal S1, S2; RRR; no murmur  Lungs:  clear to auscultation bilaterally, no wheezing, rhonchi or rales  Abd: soft, nontender, no hepatomegaly  Ext: no edema  Skin: warm and dry  Neuro:  CNs 2-12 intact, no focal abnormalities noted  EKG:   NSR at 69.   LAFB.   ASSESSMENT AND PLAN:  1. Hypertension - BP is stable ,  Encouraged her to avoid salt .   continue metoprolol 25 BID She is tolerating the metoprolol better than the coreg.  2. Hyperlipidemia - managed by her medical doctor   3. Paroxysmal atrial fibrillation - in NSR , On coumadin .   4. Obesity - she needs to lose weight ,   5. Obstructive sleep apnea - wears her CPAP  Still takes it off in the middle of the night . 6. PVCs  - continue metoprolol 25 BID     [name], MD   08/30/2016 10:18 AM    [facility] [place],  [place], [place]  [address] Pager [phone] Phone: [phone]; Fax: [phone]

## 2016-09-01 MED FILL — AMLODIPINE BESYLATE 5 MG TA: 5 | 30 days supply | Qty: 30 | Fill #0

## 2016-09-01 MED FILL — WARFARIN SODIUM 5 MG TABLET: 5 | 30 days supply | Qty: 30 | Fill #1

## 2016-09-01 MED FILL — OMEPRAZOLE DR 20 MG CAPSULE: 20 | 30 days supply | Qty: 30 | Fill #0

## 2016-09-01 MED FILL — ROSUVASTATIN CALCIUM 10 MG: 10 | 30 days supply | Qty: 30 | Fill #1

## 2016-09-29 ENCOUNTER — Ambulatory Visit (INDEPENDENT_AMBULATORY_CARE_PROVIDER_SITE_OTHER): Payer: Medicaid Other | Admitting: *Deleted

## 2016-09-29 DIAGNOSIS — Z5181 Encounter for therapeutic drug level monitoring: Secondary | ICD-10-CM

## 2016-09-29 DIAGNOSIS — I4891 Unspecified atrial fibrillation: Secondary | ICD-10-CM | POA: Diagnosis not present

## 2016-09-29 DIAGNOSIS — Z7901 Long term (current) use of anticoagulants: Secondary | ICD-10-CM | POA: Diagnosis not present

## 2016-09-29 LAB — POCT INR: INR: 2.3

## 2016-10-04 MED FILL — ROSUVASTATIN CALCIUM 10 MG: 10 | 30 days supply | Qty: 30 | Fill #2

## 2016-10-06 MED FILL — WARFARIN SODIUM 5 MG TABLET: 5 | 30 days supply | Qty: 30 | Fill #2

## 2016-10-10 MED FILL — METOPROLOL TARTRATE 50 MG T: 50 | 30 days supply | Qty: 60 | Fill #0

## 2016-10-12 ENCOUNTER — Emergency Department (HOSPITAL_COMMUNITY): Payer: Medicaid Other

## 2016-10-12 ENCOUNTER — Emergency Department (HOSPITAL_COMMUNITY)
Admission: EM | Admit: 2016-10-12 | Discharge: 2016-10-12 | Disposition: A | Discharge status: Home / Self Care | Payer: Medicaid Other | Attending: Emergency Medicine | Admitting: Emergency Medicine

## 2016-10-12 ENCOUNTER — Encounter (HOSPITAL_COMMUNITY): Payer: Self-pay

## 2016-10-12 DIAGNOSIS — I10 Essential (primary) hypertension: Secondary | ICD-10-CM | POA: Diagnosis not present

## 2016-10-12 DIAGNOSIS — Z8679 Personal history of other diseases of the circulatory system: Secondary | ICD-10-CM | POA: Insufficient documentation

## 2016-10-12 DIAGNOSIS — Z7901 Long term (current) use of anticoagulants: Secondary | ICD-10-CM | POA: Diagnosis not present

## 2016-10-12 DIAGNOSIS — R079 Chest pain, unspecified: Secondary | ICD-10-CM | POA: Diagnosis present

## 2016-10-12 LAB — CBC
HEMATOCRIT: 42 % (ref 36.0–46.0)
HEMOGLOBIN: 13.4 g/dL (ref 12.0–15.0)
MCH: 26.9 pg (ref 26.0–34.0)
MCHC: 31.9 g/dL (ref 30.0–36.0)
MCV: 84.2 fL (ref 78.0–100.0)
Platelets: 332 10*3/uL (ref 150–400)
RBC: 4.99 MIL/uL (ref 3.87–5.11)
RDW: 15.1 % (ref 11.5–15.5)
WBC: 10.7 10*3/uL — ABNORMAL HIGH (ref 4.0–10.5)

## 2016-10-12 LAB — BASIC METABOLIC PANEL
ANION GAP: 7 (ref 5–15)
BUN: 10 mg/dL (ref 6–20)
CALCIUM: 8.7 mg/dL — AB (ref 8.9–10.3)
CO2: 28 mmol/L (ref 22–32)
Chloride: 103 mmol/L (ref 101–111)
Creatinine, Ser: 0.64 mg/dL (ref 0.44–1.00)
GLUCOSE: 118 mg/dL — AB (ref 65–99)
POTASSIUM: 3.3 mmol/L — AB (ref 3.5–5.1)
Sodium: 138 mmol/L (ref 135–145)

## 2016-10-12 LAB — I-STAT TROPONIN, ED
TROPONIN I, POC: 0 ng/mL (ref 0.00–0.08)
Troponin i, poc: 0 ng/mL (ref 0.00–0.08)

## 2016-10-12 MED ORDER — MORPHINE SULFATE (PF) 4 MG/ML IV SOLN
4.0000 mg | Freq: Once | INTRAVENOUS | Status: AC
Start: 2016-10-12 — End: 2016-10-12
  Administered 2016-10-12: 4 mg via INTRAVENOUS
  Filled 2016-10-12: qty 1

## 2016-10-12 MED ORDER — GI COCKTAIL ~~LOC~~
30.0000 mL | Freq: Once | ORAL | Status: AC
  Administered 2016-10-12: 30 mL via ORAL
  Filled 2016-10-12: qty 30

## 2016-10-12 NOTE — ED Provider Notes (Signed)
[facility] DEPT Provider Note   CSN: [id number] Arrival date & time:        History   Chief Complaint Chief Complaint  Patient presents with  . Chest Pain    HPI Jacqueline Petersen is a 60 y.o. female who  has a past medical history of A-fib ([place]); Acid reflux; Arthritis; Dyslipidemia; Fatigue; GERD (gastroesophageal reflux disease); History of hysterectomy; echocardiogram; Hyperlipidemia; Hypertension; Hypokalemia; Obesity; Personal history of hypertensive heart disease; and Personal history of long-term (current) use of anticoagulants.  She presents to the ED with a cc of chest pain. The patient states that last night she ate half a pot of chitlins, a bowl of ice cream, and a bag of  No potato chips. Patient states that before bed she began having burning chest pain and pain in her throat. She states that she went to bed and thought it would get better, except that when she awoke around 5 AM it was much worse. She states that she felt pressure, nausea and some throat burning. Patient was given 324 mg of aspirin and 2 nitroglycerin with significant relief of her symptoms prior to arrival. She states that her pain was "100 out of 10" this morning when she woke up and is now a 5 out of 10. She denies a history of coronary artery disease. Patient states that she had some diarrhea this morning, but that occurs frequently when she eats ice cream. Cardiologist: Dr. Acie Fredrickson  HPI  Past Medical History:  Diagnosis Date  . A-fib ([place])   . Acid reflux   . Arthritis   . Dyslipidemia   . Fatigue   . GERD (gastroesophageal reflux disease)   . History of hysterectomy   . Hx of echocardiogram    Echocardiogram (12/15): EF 55-60%, normal wall motion, PASP 32 mmHg  . Hyperlipidemia   . Hypertension   . Hypokalemia   . Obesity   . Personal history of hypertensive heart disease   . Personal history of long-term (current) use of anticoagulants     Patient Active Problem List   Diagnosis Date Noted  . PVC's (premature ventricular contractions) 01/26/2016  . Encounter for therapeutic drug monitoring 05/08/2014  . Long term (current) use of anticoagulants 12/09/2013  . Atrial fibrillation with RVR ([place]) 12/03/2013  . Hyperlipidemia 06/12/2007  . Essential hypertension 06/12/2007  . ATRIAL FIBRILLATION 06/12/2007  . GERD 06/12/2007    Past Surgical History:  Procedure Laterality Date  . ABDOMINAL HYSTERECTOMY    . CARDIAC CATHETERIZATION  04/15/2007   Est. EF of 50-55% -- Smooth and normal coronary arteries -- Normal left ventricular systolic function.  We will continue with further treatment of her atrial fibrillation     . PARTIAL HYSTERECTOMY      OB History    No data available       Home Medications    Prior to Admission medications   Medication Sig Start Date End Date Taking? Authorizing Provider  acetaminophen (TYLENOL ARTHRITIS PAIN) 650 MG CR tablet Take 1,300 mg by mouth 2 (two) times daily as needed for pain.   Yes Historical Provider, MD  amLODipine (NORVASC) 5 MG tablet Take 1 tablet (5 mg total) by mouth 2 (two) times daily. 06/20/16  Yes Thayer Headings, MD  B Complex-C (B-COMPLEX WITH VITAMIN C) tablet Take 1 tablet by mouth daily.   Yes Historical Provider, MD  Cholecalciferol (VITAMIN D) 2000 UNITS CAPS Take 2,000 Units by mouth every evening.   Yes Historical Provider, MD  Cinnamon 500 MG capsule Take 500 mg by mouth daily.    Yes Historical Provider, MD  metoprolol tartrate (LOPRESSOR) 25 MG tablet Take 1 tablet (25 mg total) by mouth 2 (two) times daily. 08/24/16  Yes Thayer Headings, MD  omeprazole (PRILOSEC) 20 MG capsule Take 20 mg by mouth daily.    Yes Historical Provider, MD  rosuvastatin (CRESTOR) 10 MG tablet Take 10 mg by mouth at bedtime.    Yes Historical Provider, MD  warfarin (COUMADIN) 5 MG tablet Take 2.5-5 mg by mouth See admin instructions. Take 1/2 tablet on Monday, Wednesday and Friday then take 1 tablet all the other  days   Yes Historical Provider, MD  warfarin (COUMADIN) 5 MG tablet TAKE AS DIRECTED BY COUMADIN CLINIC. Patient not taking: Reported on 10/12/2016 04/17/16   Thayer Headings, MD    Family History Family History  Problem Relation Age of Onset  . Coronary artery disease    . Hypertension    . Heart failure Cousin   . Hypertension Son   . Heart attack Neg Hx   . Stroke Neg Hx     Social History Social History  Substance Use Topics  . Smoking status: Never Smoker  . Smokeless tobacco: Never Used  . Alcohol use No     Allergies   Lipitor [atorvastatin calcium]   Review of Systems Review of Systems Ten systems reviewed and are negative for acute change, except as noted in the HPI.   Physical Exam Updated Vital Signs BP 120/65   Pulse 79   Temp 98.5 F (36.9 C) (Oral)   Resp 19   Ht 5\' 3"  (1.6 m)   Wt 120.2 kg   SpO2 98%   BMI 46.94 kg/m   Physical Exam  Constitutional: She is oriented to person, place, and time. She appears well-developed and well-nourished. No distress.  HENT:  Head: Normocephalic and atraumatic.  Eyes: Conjunctivae are normal. No scleral icterus.  Neck: Normal range of motion.  Cardiovascular: Normal rate, regular rhythm and normal heart sounds.  Exam reveals no gallop and no friction rub.   No murmur heard. Pulmonary/Chest: Effort normal and breath sounds normal. No respiratory distress.  Abdominal: Soft. Bowel sounds are normal. She exhibits no distension and no mass. There is no tenderness. There is no guarding.  Neurological: She is alert and oriented to person, place, and time.  Skin: Skin is warm and dry. She is not diaphoretic.  Nursing note and vitals reviewed.    ED Treatments / Results  Labs (all labs ordered are listed, but only abnormal results are displayed) Labs Reviewed  BASIC METABOLIC PANEL - Abnormal; Notable for the following:       Result Value   Potassium 3.3 (*)    Glucose, Bld 118 (*)    Calcium 8.7 (*)    All  other components within normal limits  CBC - Abnormal; Notable for the following:    WBC 10.7 (*)    All other components within normal limits  I-STAT TROPOININ, ED  I-STAT TROPOININ, ED    EKG  EKG Interpretation  Date/Time:  Thursday October 12 2016 06:39:50 EDT Ventricular Rate:  79 PR Interval:    QRS Duration: 107 QT Interval:  403 QTC Calculation: 462 R Axis:   -31 Text Interpretation:  Sinus rhythm Left ventricular hypertrophy Borderline T abnormalities, inferior leads No significant change since last tracing Confirmed by Maryan Rued  MD, Loree Fee ([id number]) on 10/12/2016 9:44:58 AM  Radiology Dg Chest 2 View  Result Date: 10/12/2016 CLINICAL DATA:  Chest pains. EXAM: CHEST  2 VIEW COMPARISON:  08/16/2015.  01/01/2015. FINDINGS: Mediastinum hilar structures normal. Heart size normal. Mild increase interstitial markings noted bilaterally suggesting mild pneumonitis. No pleural effusion or pneumothorax . IMPRESSION: Mild increase interstitial markings noted bilaterally. This suggests mild pneumonitis. Exam is otherwise unremarkable. Electronically Signed   By: Marcello Moores  Register   On: 10/12/2016 07:28    Procedures Procedures (including critical care time)  Medications Ordered in ED Medications  morphine 4 MG/ML injection 4 mg (4 mg Intravenous Given 10/12/16 0840)  gi cocktail (Maalox,Lidocaine,Donnatal) (30 mLs Oral Given 10/12/16 0847)     Initial Impression / Assessment and Plan / ED Course  I have reviewed the triage vital signs and the nursing notes.  Pertinent labs & imaging results that were available during my care of the patient were reviewed by me and considered in my medical decision making (see chart for details).  Clinical Course  Comment By Time  Patient has a slight, nonproductive cough. Margarita Mail, PA-C 10/19 0827    Patient with heart score of making her low risk for MACE. Chest pain resolved and no repeat  Chest pain.  Patient is to be  discharged with recommendation to follow up with PCP in regards to today's hospital visit. Chest pain is not likely of cardiac or pulmonary. VSS, [place] low risk without sob no tracheal deviation, no JVD or new murmur, RRR, breath sounds equal bilaterally, EKG without acute abnormalities,2 negative troponins, and negative CXR. Pt has been advised to continue on her PPI, eat small meals,and return to the ED is CP becomes exertional, associated with diaphoresis or nausea, radiates to left jaw/arm, worsens or becomes concerning in any way. Pt appears reliable for follow up and is agreeable to discharge.   Case has been discussed with and seen by Dr. Maryan Rued  who agrees with the above plan to discharge.    Final Clinical Impressions(s) / ED Diagnoses   Final diagnoses:  Chest pain, unspecified type    New Prescriptions New Prescriptions   No medications on file     Margarita Mail, PA-C 10/12/16 [place], MD 10/12/16 2141

## 2016-10-12 NOTE — Discharge Instructions (Signed)
Your chest pain does not appear to be due to an emergent cause. Eat small meals and take your Prilosec. Your caregiver has diagnosed you as having chest pain that is not specific for one problem, but does not require admission.  You are at low risk for an acute heart condition or other serious illness. Chest pain comes from many different causes.  SEEK IMMEDIATE MEDICAL ATTENTION IF: You have severe chest pain, especially if the pain is crushing or pressure-like and spreads to the arms, back, neck, or jaw, or if you have sweating, nausea (feeling sick to your stomach), or shortness of breath. THIS IS AN EMERGENCY. Don't wait to see if the pain will go away. Get medical help at once. Call 911 or 0 (operator). DO NOT drive yourself to the hospital.  Your chest pain gets worse and does not go away with rest.  You have an attack of chest pain lasting longer than usual, despite rest and treatment with the medications your caregiver has prescribed.  You wake from sleep with chest pain or shortness of breath.  You feel dizzy or faint.  You have chest pain not typical of your usual pain for which you originally saw your caregiver.

## 2016-10-12 NOTE — ED Notes (Signed)
Patient transported to X-ray 

## 2016-10-12 NOTE — ED Triage Notes (Addendum)
Pt arrives via EMS after chest pain that began last night after "eating too much chitlins, ice cream, soda, and no salt chips". Pt reports SOB. 324mg  asa and 2 nitro given pta. 22 LAC

## 2016-10-25 MED FILL — OMEPRAZOLE 20 MG CAPSULE DR: 20 | 30 days supply | Qty: 30 | Fill #1

## 2016-10-25 MED FILL — AMLODIPINE BESYLATE 5 MG TA: 5 | 30 days supply | Qty: 30 | Fill #1

## 2016-10-31 ENCOUNTER — Other Ambulatory Visit: Payer: Self-pay | Admitting: Cardiovascular Disease

## 2016-10-31 MED FILL — ROSUVASTATIN CALCIUM 10 MG: 10 | 30 days supply | Qty: 30 | Fill #0

## 2016-10-31 MED FILL — WARFARIN SODIUM 5 MG TABLET: 5 | 90 days supply | Qty: 90 | Fill #0

## 2016-11-10 ENCOUNTER — Ambulatory Visit (INDEPENDENT_AMBULATORY_CARE_PROVIDER_SITE_OTHER): Payer: Medicaid Other | Admitting: *Deleted

## 2016-11-10 DIAGNOSIS — I4891 Unspecified atrial fibrillation: Secondary | ICD-10-CM | POA: Diagnosis not present

## 2016-11-10 DIAGNOSIS — Z7901 Long term (current) use of anticoagulants: Secondary | ICD-10-CM

## 2016-11-10 DIAGNOSIS — Z5181 Encounter for therapeutic drug level monitoring: Secondary | ICD-10-CM | POA: Diagnosis not present

## 2016-11-10 LAB — POCT INR: INR: 2.3

## 2016-11-28 MED FILL — OMEPRAZOLE 20 MG CAPSULE DR: 20 | 30 days supply | Qty: 30 | Fill #0

## 2016-11-29 MED FILL — METOPROLOL TARTRATE 50 MG T: 50 | 30 days supply | Qty: 60 | Fill #1

## 2016-12-04 MED FILL — AMLODIPINE BESYLATE 5 MG TA: 5 | 30 days supply | Qty: 30 | Fill #2

## 2016-12-04 MED FILL — ROSUVASTATIN CALCIUM 10 MG: 10 | 30 days supply | Qty: 30 | Fill #1

## 2017-01-01 ENCOUNTER — Other Ambulatory Visit: Payer: Self-pay | Admitting: Family Medicine

## 2017-01-01 DIAGNOSIS — Z1231 Encounter for screening mammogram for malignant neoplasm of breast: Secondary | ICD-10-CM

## 2017-01-02 MED FILL — ROSUVASTATIN CALCIUM 10 MG: 10 | 30 days supply | Qty: 30 | Fill #2

## 2017-01-02 MED FILL — OMEPRAZOLE 20 MG CAPSULE DR: 20 | 30 days supply | Qty: 30 | Fill #1

## 2017-01-02 MED FILL — METOPROLOL TARTRATE 50 MG T: 50 | 30 days supply | Qty: 60 | Fill #2

## 2017-01-05 ENCOUNTER — Telehealth: Payer: Self-pay | Admitting: Nurse Practitioner

## 2017-01-05 ENCOUNTER — Other Ambulatory Visit (HOSPITAL_BASED_OUTPATIENT_CLINIC_OR_DEPARTMENT_OTHER): Payer: Self-pay

## 2017-01-05 DIAGNOSIS — R0683 Snoring: Secondary | ICD-10-CM

## 2017-01-05 DIAGNOSIS — G4730 Sleep apnea, unspecified: Secondary | ICD-10-CM

## 2017-01-05 DIAGNOSIS — G4710 Hypersomnia, unspecified: Secondary | ICD-10-CM

## 2017-01-05 NOTE — Telephone Encounter (Signed)
Received form from patient with notes from [facility] asking Dr. Acie Fredrickson if it is okay for her to take voltaren. I advised her that per Dr. Acie Fredrickson, she may take voltaren.  She verbalized understanding and thanked me for the call.

## 2017-01-18 MED FILL — VOLTAREN 1% GEL: 1 | 11 days supply | Qty: 100 | Fill #0

## 2017-01-31 ENCOUNTER — Ambulatory Visit
Admission: RE | Admit: 2017-01-31 | Discharge: 2017-01-31 | Disposition: A | Discharge status: Home / Self Care | Payer: Medicaid Other | Source: Ambulatory Visit | Attending: Family Medicine | Admitting: Family Medicine

## 2017-01-31 DIAGNOSIS — Z1231 Encounter for screening mammogram for malignant neoplasm of breast: Secondary | ICD-10-CM

## 2017-01-31 MED FILL — OMEPRAZOLE 20 MG CAPSULE DR: 20 | 30 days supply | Qty: 30 | Fill #2

## 2017-02-05 MED FILL — ROSUVASTATIN CALCIUM 10 MG: 10 | 30 days supply | Qty: 30 | Fill #3

## 2017-02-05 MED FILL — AMLODIPINE BESYLATE 5 MG TA: 5 | 30 days supply | Qty: 30 | Fill #0

## 2017-02-11 ENCOUNTER — Ambulatory Visit (HOSPITAL_BASED_OUTPATIENT_CLINIC_OR_DEPARTMENT_OTHER): Payer: Medicaid Other | Attending: Internal Medicine | Admitting: Internal Medicine

## 2017-02-11 DIAGNOSIS — G4710 Hypersomnia, unspecified: Secondary | ICD-10-CM | POA: Diagnosis present

## 2017-02-11 DIAGNOSIS — G4730 Sleep apnea, unspecified: Secondary | ICD-10-CM | POA: Diagnosis present

## 2017-02-11 DIAGNOSIS — G4733 Obstructive sleep apnea (adult) (pediatric): Secondary | ICD-10-CM | POA: Diagnosis not present

## 2017-02-11 DIAGNOSIS — R0683 Snoring: Secondary | ICD-10-CM | POA: Diagnosis present

## 2017-02-17 DIAGNOSIS — R0683 Snoring: Secondary | ICD-10-CM | POA: Diagnosis not present

## 2017-02-17 NOTE — Procedures (Signed)
Patient Name: Jacqueline Petersen, Jacqueline Petersen Date: 02/11/2017 Gender: Female D.O.B: May 14, 1956 Age (years): 61 Referring Provider: Carlena Sax Height (inches): 66 Interpreting Physician: Baird Lyons MD, ABSM Weight (lbs): 258 RPSGT: Joni Reining BMI: 37 MRN: [id number] Neck Size: 16.00 CLINICAL INFORMATION Sleep Study Type: Split Night CPAP  Indication for sleep study: Excessive Daytime Sleepiness, Fatigue, Obesity, OSA, Snoring, Witnessed Apneas  Epworth Sleepiness Score: 21  SLEEP STUDY TECHNIQUE As per the AASM Manual for the Scoring of Sleep and Associated Events v2.3 (April 2016) with a hypopnea requiring 4% desaturations.  The channels recorded and monitored were frontal, central and occipital EEG, electrooculogram (EOG), submentalis EMG (chin), nasal and oral airflow, thoracic and abdominal wall motion, anterior tibialis EMG, snore microphone, electrocardiogram, and pulse oximetry. Continuous positive airway pressure (CPAP) was initiated when the patient met split night criteria and was titrated according to treat sleep-disordered breathing.  MEDICATIONS Medications self-administered by patient taken the night of the study : none reported  RESPIRATORY PARAMETERS Diagnostic  Total AHI (/hr): 30.9 RDI (/hr): 34.2 OA Index (/hr): 0.9 CA Index (/hr): 0.0 REM AHI (/hr): N/A NREM AHI (/hr): 30.9 Supine AHI (/hr): 53.8 Non-supine AHI (/hr): 2.09 Min O2 Sat (%): 85.00 Mean O2 (%): 93.75 Time below 88% (min): 2.3   Titration  Optimal Pressure (cm): 10 AHI at Optimal Pressure (/hr): 0.0 Min O2 at Optimal Pressure (%): 94.0 Supine % at Optimal (%): 100 Sleep % at Optimal (%): 88   SLEEP ARCHITECTURE The recording time for the entire night was 402.6 minutes.  During a baseline period of 174.3 minutes, the patient slept for 130.0 minutes in REM and nonREM, yielding a sleep efficiency of 74.6%. Sleep onset after lights out was 25.6 minutes with a REM latency of N/A minutes. The  patient spent 10.77% of the night in stage N1 sleep, 74.23% in stage N2 sleep, 15.00% in stage N3 and 0.00% in REM.  During the titration period of 223.4 minutes, the patient slept for 198.0 minutes in REM and nonREM, yielding a sleep efficiency of 88.6%. Sleep onset after CPAP initiation was 14.0 minutes with a REM latency of 7.5 minutes. The patient spent 4.80% of the night in stage N1 sleep, 51.52% in stage N2 sleep, 11.11% in stage N3 and 32.58% in REM.  CARDIAC DATA The 2 lead EKG demonstrated sinus rhythm. The mean heart rate was 69.15 beats per minute. Other EKG findings include: PVCs.  LEG MOVEMENT DATA The total Periodic Limb Movements of Sleep (PLMS) were 0. The PLMS index was 0.00 .  IMPRESSIONS - Severe obstructive sleep apnea occurred during the diagnostic portion of the study (AHI = 30.9/hour). An optimal PAP pressure was selected for this patient ( 10 cm of water) - No significant central sleep apnea occurred during the diagnostic portion of the study (CAI = 0.0/hour). - Severe oxygen desaturation was noted during the diagnostic portion of the study (Min O2 = 85.00%). - The patient snored with Moderate snoring volume during the diagnostic portion of the study. - EKG findings include PVCs. - Clinically significant periodic limb movements did not occur during sleep.  DIAGNOSIS - Obstructive Sleep Apnea (327.23 [G47.33 ICD-10])  RECOMMENDATIONS - Trial of CPAP therapy on 10 cm H2O with a Small size Fisher&Paykel Full Face Mask Simplus mask and heated humidification. - Avoid alcohol, sedatives and other CNS depressants that may worsen sleep apnea and disrupt normal sleep architecture. - Sleep hygiene should be reviewed to assess factors that may improve sleep quality. - Weight management and regular  exercise should be initiated or continued.  [Electronically signed] 02/17/2017 04:34 PM  Baird Lyons MD, [place], American Board of Sleep Medicine   NPI:  [id number]  [place], American Board of Sleep Medicine  ELECTRONICALLY SIGNED ON:  02/17/2017, 4:23 PM [place] PH: [phone]   FX: [phone] [place]

## 2017-02-22 MED FILL — METOPROLOL TARTRATE 50 MG T: 50 | 30 days supply | Qty: 60 | Fill #3

## 2017-02-22 MED FILL — WARFARIN SODIUM 5 MG TABLET: 5 | 90 days supply | Qty: 90 | Fill #1

## 2017-02-23 ENCOUNTER — Encounter: Payer: Self-pay | Admitting: Cardiovascular Disease

## 2017-03-05 ENCOUNTER — Encounter: Payer: Self-pay | Admitting: Cardiovascular Disease

## 2017-03-05 ENCOUNTER — Ambulatory Visit (INDEPENDENT_AMBULATORY_CARE_PROVIDER_SITE_OTHER): Payer: Medicaid Other

## 2017-03-05 ENCOUNTER — Ambulatory Visit (INDEPENDENT_AMBULATORY_CARE_PROVIDER_SITE_OTHER): Payer: Medicaid Other | Admitting: Cardiovascular Disease

## 2017-03-05 VITALS — BP 130/80 | HR 75 | Ht 63.0 in | Wt 256.1 lb

## 2017-03-05 DIAGNOSIS — Z7901 Long term (current) use of anticoagulants: Secondary | ICD-10-CM | POA: Diagnosis not present

## 2017-03-05 DIAGNOSIS — I4891 Unspecified atrial fibrillation: Secondary | ICD-10-CM

## 2017-03-05 DIAGNOSIS — I480 Paroxysmal atrial fibrillation: Secondary | ICD-10-CM

## 2017-03-05 DIAGNOSIS — I493 Ventricular premature depolarization: Secondary | ICD-10-CM

## 2017-03-05 DIAGNOSIS — Z5181 Encounter for therapeutic drug level monitoring: Secondary | ICD-10-CM

## 2017-03-05 DIAGNOSIS — I10 Essential (primary) hypertension: Secondary | ICD-10-CM

## 2017-03-05 LAB — POCT INR: INR: 1.7

## 2017-03-05 NOTE — Patient Instructions (Signed)
Your physician recommends that you continue on your current medications as directed. Please refer to the Current Medication list given to you today.   Your physician recommends that you return for lab work in:  BMET  AT  [place] wants you to follow-up in: [place] will receive a reminder letter in the mail two months in advance. If you don't receive a letter, please call our office to schedule the follow-up appointment.

## 2017-03-05 NOTE — Progress Notes (Signed)
[address] [place], [place]  [address] Phone: [phone] Fax:  [phone]  Date:  03/05/2017   ID:  Jacqueline Petersen, Jacqueline Petersen Feb 25, 1956, MRN [id number]  PCP:  [facility].  Problem list: 1. Hypertension 2. Hyperlipidemia 3. Paroxysmal atrial fibrillation 4. Obesity 5. Obstructive sleep apnea 6. PVCs      History of Present Illness: Jacqueline Petersen is a 61 y.o. female with a hx of HTN, HL, paroxysmal atrial fibrillation previously on Coumadin, GERD and obesity. LHC (03/2007): Normal coronary arteries, EF 50-55%. Patient was recently admitted 12/10-12/13 after presenting with palpitations. She was found to be in atrial fibrillation with RVR. She converted to NSR on IV diltiazem. It was felt that she needed long-term anticoagulation. CHADS2-VASc=2.  She was placed on Coumadin. Echocardiogram (12/04/2013): Moderate LVH, EF 50-55%, normal wall motion.  She has generally been doing okay since discharge. She feels fatigued. She's had occasional palpitations. These are fairly consistent with what she has had in the past. She denies any prolonged rapid palpitations. She denies chest pain, dyspnea, syncope, orthopnea, PND or edema. She is compliant with her CPAP.   March 12, 2014: Ms. Winslett has a hx of AF in the remote past.   She has normal coronaries.  She was admitted with Afib- and converted with dilt drip.  She continues to have problems with HTN.  She complains about pain and firmness in her left leg.  She has continued to gain weight He has a history of sleep apnea. She does not look like wearing her CPAP mask because it is uncomfortable at night.  Sept. 16, 2015:  Pt is doing well.  I have met her in the past.  She was seen by [name] in March for follow up for her a-fib.  Still eating salt.- diastolic BP is still elevated.  Unable to exercise due to leg pain  Nov. 24, 2015:  Mr. Peckenpaugh is seen back today for follow up of her  HTN. We added Amlodipine at her last visit.  She has not had any further episodes of atrial fib. She has lots of pain in her knees.  Is scheduled to see an orthopedist soon.  Feb. 1, 2017:  She has  Seen Dr. Caryl Comes since I last seen her. She has significant number of premature ventricular contractions. A 24-hour Holter revealed 11% PVC burden. These have greatly improved when she limited caffeine from her diet.  Signal average ECG was normal   Repeat echocardiogram reveals normal left ventricular systolic function. She is doing well.   No dyspnea, no CP  Her CPAP is not working.    She needs to have right knee replacement .     Sept. 6, 2017:  Doing well.  No further palpitations  - better since she is avoiding caffiene.  March 05, 2017:  Jacqueline Petersen is seen today. Feeling sluggish. Using her CPAP - fairly well 4 hrs at night    Wt Readings from Last 3 Encounters:  03/05/17 256 lb 1.9 oz (116.2 kg)  02/11/17 258 lb (117 kg)  10/12/16 265 lb (120.2 kg)     Past Medical History:  Diagnosis Date  . A-fib ([place])   . Acid reflux   . Arthritis   . Dyslipidemia   . Fatigue   . GERD (gastroesophageal reflux disease)   . History of hysterectomy   . Hx of echocardiogram    Echocardiogram (12/15): EF 55-60%, normal wall motion, PASP 32 mmHg  .  Hyperlipidemia   . Hypertension   . Hypokalemia   . Obesity   . Personal history of hypertensive heart disease   . Personal history of long-term (current) use of anticoagulants     Current Outpatient Prescriptions  Medication Sig Dispense Refill  . acetaminophen (TYLENOL ARTHRITIS PAIN) 650 MG CR tablet Take 1,300 mg by mouth 2 (two) times daily as needed for pain.    Marland Kitchen amLODipine (NORVASC) 5 MG tablet Take 1 tablet (5 mg total) by mouth 2 (two) times daily. 180 tablet 1  . B Complex-C (B-COMPLEX WITH VITAMIN C) tablet Take 1 tablet by mouth daily.    . Cholecalciferol (VITAMIN D) 2000 UNITS CAPS Take 2,000 Units by mouth every evening.     . Cinnamon 500 MG capsule Take 500 mg by mouth daily.     . metoprolol tartrate (LOPRESSOR) 25 MG tablet Take 1 tablet (25 mg total) by mouth 2 (two) times daily. 60 tablet 11  . omeprazole (PRILOSEC) 20 MG capsule Take 20 mg by mouth daily.     . rosuvastatin (CRESTOR) 10 MG tablet Take 10 mg by mouth at bedtime.     Marland Kitchen warfarin (COUMADIN) 5 MG tablet TAKE AS DIRECTED BY COUMADIN CLINIC 90 tablet 1   No current facility-administered medications for this visit.     Allergies:   Lipitor [atorvastatin calcium]   Social History:  The patient  reports that she has never smoked. She has never used smokeless tobacco. She reports that she does not drink alcohol or use drugs.   Family History:  The patient's family history includes Heart failure in her cousin; Hypertension in her son.   ROS:  Please see the history of present illness.   She denies any bleeding problems. She has had some recent URI symptoms. She denies fever.   All other systems reviewed and negative.   PHYSICAL EXAM: VS:  BP 130/80 (BP Location: Right Arm, Patient Position: Sitting, Cuff Size: Large)   Pulse 75   Ht _0  (1.6 m)   Wt 256 lb 1.9 oz (116.2 kg)   SpO2 98%   BMI 45.37 kg/m  Well nourished, well developed, in no acute distress  HEENT: normal  Neck: no JVD  Cardiac:  normal S1, S2; RRR; no murmur  Lungs:  clear to auscultation bilaterally, no wheezing, rhonchi or rales  Abd: soft, nontender, no hepatomegaly  Ext: no edema  Skin: warm and dry  Neuro:  CNs 2-12 intact, no focal abnormalities noted  EKG:     ASSESSMENT AND PLAN:  1. Hypertension - BP is stable ,  Encouraged her to avoid salt .   continue metoprolol 25 BID She is tolerating the metoprolol better than the coreg.   2. Hyperlipidemia - managed by her medical doctor   3. Paroxysmal atrial fibrillation - in NSR , On coumadin .   4. Obesity - she needs to lose weight ,   5. Obstructive sleep apnea - wears her CPAP  Still takes it off  in the middle of the night . 6. PVCs  - continue metoprolol 25 BID     [name], MD  03/05/2017 11:30 AM    [facility] [place],  [place], [place]  [address] Pager [phone] Phone: [phone]; Fax: [phone]

## 2017-03-07 MED FILL — ROSUVASTATIN CALCIUM 10 MG: 10 | 30 days supply | Qty: 30 | Fill #4

## 2017-03-12 MED FILL — OMEPRAZOLE 20 MG CAPSULE DR: 20 | 30 days supply | Qty: 30 | Fill #3

## 2017-03-23 ENCOUNTER — Ambulatory Visit (INDEPENDENT_AMBULATORY_CARE_PROVIDER_SITE_OTHER): Payer: Medicaid Other | Admitting: *Deleted

## 2017-03-23 DIAGNOSIS — I4891 Unspecified atrial fibrillation: Secondary | ICD-10-CM

## 2017-03-23 DIAGNOSIS — Z7901 Long term (current) use of anticoagulants: Secondary | ICD-10-CM

## 2017-03-23 DIAGNOSIS — Z5181 Encounter for therapeutic drug level monitoring: Secondary | ICD-10-CM | POA: Diagnosis not present

## 2017-03-23 LAB — POCT INR: INR: 2.6

## 2017-03-30 MED FILL — AMLODIPINE BESYLATE 5 MG TA: 5 | 30 days supply | Qty: 30 | Fill #1

## 2017-04-04 MED FILL — ROSUVASTATIN CALCIUM 10 MG: 10 | 30 days supply | Qty: 30 | Fill #5

## 2017-04-04 MED FILL — METHYLPREDNISOLONE 4 MG TAB: 4 | 6 days supply | Qty: 21 | Fill #0

## 2017-04-11 MED FILL — OMEPRAZOLE 20 MG CAPSULE DR: 20 | 30 days supply | Qty: 30 | Fill #0

## 2017-04-23 ENCOUNTER — Ambulatory Visit (INDEPENDENT_AMBULATORY_CARE_PROVIDER_SITE_OTHER): Payer: Medicaid Other | Admitting: *Deleted

## 2017-04-23 ENCOUNTER — Encounter (INDEPENDENT_AMBULATORY_CARE_PROVIDER_SITE_OTHER): Payer: Self-pay

## 2017-04-23 DIAGNOSIS — Z5181 Encounter for therapeutic drug level monitoring: Secondary | ICD-10-CM | POA: Diagnosis not present

## 2017-04-23 DIAGNOSIS — Z7901 Long term (current) use of anticoagulants: Secondary | ICD-10-CM | POA: Diagnosis not present

## 2017-04-23 DIAGNOSIS — I4891 Unspecified atrial fibrillation: Secondary | ICD-10-CM

## 2017-04-23 LAB — POCT INR: INR: 3.1

## 2017-05-02 ENCOUNTER — Other Ambulatory Visit: Payer: Self-pay | Admitting: Orthopedic Surgery

## 2017-05-02 DIAGNOSIS — M545 Low back pain: Secondary | ICD-10-CM

## 2017-05-07 ENCOUNTER — Ambulatory Visit (INDEPENDENT_AMBULATORY_CARE_PROVIDER_SITE_OTHER): Payer: Medicaid Other | Admitting: *Deleted

## 2017-05-07 DIAGNOSIS — Z5181 Encounter for therapeutic drug level monitoring: Secondary | ICD-10-CM

## 2017-05-07 DIAGNOSIS — I4891 Unspecified atrial fibrillation: Secondary | ICD-10-CM

## 2017-05-07 DIAGNOSIS — Z7901 Long term (current) use of anticoagulants: Secondary | ICD-10-CM | POA: Diagnosis not present

## 2017-05-07 LAB — POCT INR: INR: 2.4

## 2017-05-07 MED FILL — ROSUVASTATIN CALCIUM 10 MG: 10 | 30 days supply | Qty: 30 | Fill #0

## 2017-05-08 ENCOUNTER — Ambulatory Visit
Admission: RE | Admit: 2017-05-08 | Discharge: 2017-05-08 | Disposition: A | Discharge status: Home / Self Care | Payer: Medicaid Other | Source: Ambulatory Visit | Attending: Orthopedic Surgery | Admitting: Orthopedic Surgery

## 2017-05-08 DIAGNOSIS — M545 Low back pain: Secondary | ICD-10-CM

## 2017-05-18 MED FILL — METOPROLOL SUCC ER 100 MG T: 100 | 30 days supply | Qty: 30 | Fill #0

## 2017-05-18 MED FILL — AMLODIPINE BESYLATE 5 MG TA: 5 | 30 days supply | Qty: 30 | Fill #2

## 2017-05-18 MED FILL — OMEPRAZOLE 20 MG CAPSULE DR: 20 | 30 days supply | Qty: 30 | Fill #1

## 2017-05-24 ENCOUNTER — Emergency Department (HOSPITAL_COMMUNITY)
Admission: EM | Admit: 2017-05-24 | Discharge: 2017-05-24 | Disposition: A | Discharge status: Home / Self Care | Payer: Medicaid Other | Attending: Emergency Medicine | Admitting: Emergency Medicine

## 2017-05-24 ENCOUNTER — Encounter (HOSPITAL_COMMUNITY): Payer: Self-pay

## 2017-05-24 DIAGNOSIS — L237 Allergic contact dermatitis due to plants, except food: Secondary | ICD-10-CM | POA: Insufficient documentation

## 2017-05-24 DIAGNOSIS — R21 Rash and other nonspecific skin eruption: Secondary | ICD-10-CM | POA: Diagnosis present

## 2017-05-24 HISTORY — DX: Zoster without complications: B02.9

## 2017-05-24 NOTE — Discharge Instructions (Signed)
There are several things you can do to help with the discomfort from poison ivy. You can take oatmeal baths and apply a cool compress or ice to places like the face and eyes. You can continue to apply calamine lotions to the arms and legs. Benadryl is over the counter and may help you sleep, but does not necessarily help the symptoms of poison icy. For the areas under the breast, you can try an over the counter product called Burrow's solution. If the redness and swelling around the eye worsens, you can follow up with your primary care provider. If you develop shortness of breath, fever, chills, or if the rash significantly worsens, you can return to the Emergency Department for re-evaluation.

## 2017-05-24 NOTE — ED Provider Notes (Signed)
[facility] DEPT Provider Note   CSN: [id number] Arrival date & time: 05/24/17  2035  By signing my name below, I, Neta Mends, attest that this documentation has been prepared under the direction and in the presence of Dontavious Emily, PA-C. Electronically Signed: Neta Mends, ED Scribe. 05/24/2017. 11:30 PM.    History   Chief Complaint Chief Complaint  Patient presents with  . Rash    The history is provided by the patient. No language interpreter was used.   HPI Comments:  Jacqueline Petersen is a 61 y.o. female with PMHx of HTN who presents to the Emergency Department complaining of a worsening generalized rash x 2 days. Pt states that 2 days ago she was working in her garden, and has had a spreading itchy rash to her arms, legs, breast, neck and face. She first noticed the rash on her right wrist. Pt has used calamine with no relief. Denies all other associated symptoms at this time, including shortness of breath difficulty breathing, or throat swelling.    Past Medical History:  Diagnosis Date  . A-fib ([place])   . Acid reflux   . Arthritis   . Dyslipidemia   . Fatigue   . GERD (gastroesophageal reflux disease)   . History of hysterectomy   . Hx of echocardiogram    Echocardiogram (12/15): EF 55-60%, normal wall motion, PASP 32 mmHg  . Hyperlipidemia   . Hypertension   . Hypokalemia   . Obesity   . Personal history of hypertensive heart disease   . Personal history of long-term (current) use of anticoagulants   . Shingles     Patient Active Problem List   Diagnosis Date Noted  . PVC's (premature ventricular contractions) 01/26/2016  . Encounter for therapeutic drug monitoring 05/08/2014  . Long term (current) use of anticoagulants 12/09/2013  . Atrial fibrillation with RVR ([place]) 12/03/2013  . Hyperlipidemia 06/12/2007  . Essential hypertension 06/12/2007  . ATRIAL FIBRILLATION 06/12/2007  . GERD 06/12/2007    Past Surgical History:    Procedure Laterality Date  . ABDOMINAL HYSTERECTOMY    . CARDIAC CATHETERIZATION  04/15/2007   Est. EF of 50-55% -- Smooth and normal coronary arteries -- Normal left ventricular systolic function.  We will continue with further treatment of her atrial fibrillation     . PARTIAL HYSTERECTOMY      OB History    No data available       Home Medications    Prior to Admission medications   Medication Sig Start Date End Date Taking? Authorizing Provider  acetaminophen (TYLENOL ARTHRITIS PAIN) 650 MG CR tablet Take 1,300 mg by mouth 2 (two) times daily as needed for pain.    [provider]  amLODipine (NORVASC) 5 MG tablet Take 1 tablet (5 mg total) by mouth 2 (two) times daily. 06/20/16   Nahser, Wonda Cheng, MD  B Complex-C (B-COMPLEX WITH VITAMIN C) tablet Take 1 tablet by mouth daily.    [provider]  Cholecalciferol (VITAMIN D) 2000 UNITS CAPS Take 2,000 Units by mouth every evening.    [provider]  Cinnamon 500 MG capsule Take 500 mg by mouth daily.     [provider]  metoprolol tartrate (LOPRESSOR) 25 MG tablet Take 1 tablet (25 mg total) by mouth 2 (two) times daily. 08/24/16   Nahser, Wonda Cheng, MD  omeprazole (PRILOSEC) 20 MG capsule Take 20 mg by mouth daily.     [provider]  rosuvastatin (CRESTOR) 10  MG tablet Take 10 mg by mouth at bedtime.     [provider]  warfarin (COUMADIN) 5 MG tablet TAKE AS DIRECTED BY COUMADIN CLINIC 10/31/16   Nahser, Wonda Cheng, MD    Family History Family History  Problem Relation Age of Onset  . Coronary artery disease Unknown   . Hypertension Unknown   . Heart failure Cousin   . Hypertension Son   . Heart attack Neg Hx   . Stroke Neg Hx     Social History Social History  Substance Use Topics  . Smoking status: Never Smoker  . Smokeless tobacco: Never Used  . Alcohol use No     Allergies   Lipitor [atorvastatin calcium]   Review of Systems Review of Systems   Constitutional: Negative for activity change and chills.  Respiratory: Negative for shortness of breath.   Cardiovascular: Negative for chest pain.  Gastrointestinal: Negative for abdominal pain.  Musculoskeletal: Negative for back pain.  Skin: Positive for rash.   Physical Exam Updated Vital Signs BP 140/80 (BP Location: Right Arm)   Pulse 70   Temp 98.3 F (36.8 C) (Oral)   Resp 16   SpO2 100%   Physical Exam  Constitutional: No distress.  HENT:  Head: Normocephalic.  Eyes: Conjunctivae are normal.  Redness around 3/4 of upper, inner and lower inner quadrants of the left eye with minimal injection.   Neck: Neck supple.  Cardiovascular: Normal rate and regular rhythm.  Exam reveals no gallop and no friction rub.   No murmur heard. Pulmonary/Chest: Effort normal. No respiratory distress.  Abdominal: Soft. She exhibits no distension.  Neurological: She is alert.  Skin: Skin is warm. No rash noted.  Linear rash to right posterior forearm. Scattered vesicles along right lower leg. Redness with diffuse vesicles under the right breast.   Psychiatric: Her behavior is normal.  Nursing note and vitals reviewed.    ED Treatments / Results  DIAGNOSTIC STUDIES:  Oxygen Saturation is 100% on RA, normal by my interpretation.    COORDINATION OF CARE:  10:47 PM Discussed treatment plan with pt at bedside and pt agreed to plan.   Labs (all labs ordered are listed, but only abnormal results are displayed) Labs Reviewed - No data to display  EKG  EKG Interpretation None       Radiology No results found.  Procedures Procedures (including critical care time)  Medications Ordered in ED Medications - No data to display   Initial Impression / Assessment and Plan / ED Course  I have reviewed the triage vital signs and the nursing notes.  Pertinent labs & imaging results that were available during my care of the patient were reviewed by me and considered in my medical  decision making (see chart for details).    Poison Ivy Pt with a h/o of DM Type II with presentation consistant with allergic contact dermatitis. Discussed the patient with Dr. Wilson Singer, attending physician regarding the involvement of the left eye. Will hold off on prednisone at this time given the redness and swelling has actually improved over the course of the evening per the patient.. Encouraged the patient to follow up with her PCP for a re-check in 2-3 days. OTC supportive therapy recommended. Discussed contagiousness & home care. Strict return precautions discussed. Pt afebrile and in NAD prior to dc. Airway intact without compromise. No genital involvement of rash.   Final Clinical Impressions(s) / ED Diagnoses   Final diagnoses:  Poison ivy dermatitis  New Prescriptions Discharge Medication List as of 05/24/2017 11:42 PM    I personally performed the services described in this documentation, which was scribed in my presence. The recorded information has been reviewed and is accurate.     Joanne Gavel, PA-C 05/29/17 0446    Virgel Manifold, MD 06/04/17 [phone]

## 2017-05-24 NOTE — ED Triage Notes (Signed)
Pt states that she was pulling weeds that other day in her garden and later broke out in a rash, small bumps to R arm, under breast, neck and cheek. Pt states rash is very painful and itching. Hx of shingles. Pt states that she tried calamine lotion without relief

## 2017-05-24 NOTE — ED Notes (Signed)
Pt was pulling weeds in her yard and didn't know she was pulling up poison oak.  Pt states rash is itchy and painful.  Left eye involved.

## 2017-06-04 ENCOUNTER — Ambulatory Visit (INDEPENDENT_AMBULATORY_CARE_PROVIDER_SITE_OTHER): Payer: Medicaid Other | Admitting: *Deleted

## 2017-06-04 DIAGNOSIS — I4891 Unspecified atrial fibrillation: Secondary | ICD-10-CM

## 2017-06-04 DIAGNOSIS — Z5181 Encounter for therapeutic drug level monitoring: Secondary | ICD-10-CM

## 2017-06-04 DIAGNOSIS — Z7901 Long term (current) use of anticoagulants: Secondary | ICD-10-CM | POA: Diagnosis not present

## 2017-06-04 LAB — POCT INR: INR: 2

## 2017-06-12 MED FILL — ROSUVASTATIN CALCIUM 10 MG: 10 | 30 days supply | Qty: 30 | Fill #1

## 2017-06-15 MED FILL — METOPROLOL SUCC ER 100 MG T: 100 | 30 days supply | Qty: 30 | Fill #0

## 2017-06-19 ENCOUNTER — Other Ambulatory Visit: Payer: Self-pay | Admitting: Cardiovascular Disease

## 2017-06-19 MED FILL — WARFARIN SODIUM 5 MG TABLET: 5 | 90 days supply | Qty: 90 | Fill #0

## 2017-06-20 MED FILL — OMEPRAZOLE 20 MG CAPSULE DR: 20 | 30 days supply | Qty: 30 | Fill #2

## 2017-07-09 ENCOUNTER — Encounter (INDEPENDENT_AMBULATORY_CARE_PROVIDER_SITE_OTHER): Payer: Self-pay

## 2017-07-09 ENCOUNTER — Ambulatory Visit (INDEPENDENT_AMBULATORY_CARE_PROVIDER_SITE_OTHER): Payer: Medicaid Other | Admitting: *Deleted

## 2017-07-09 DIAGNOSIS — Z7901 Long term (current) use of anticoagulants: Secondary | ICD-10-CM | POA: Diagnosis not present

## 2017-07-09 DIAGNOSIS — I4891 Unspecified atrial fibrillation: Secondary | ICD-10-CM | POA: Diagnosis not present

## 2017-07-09 DIAGNOSIS — Z5181 Encounter for therapeutic drug level monitoring: Secondary | ICD-10-CM | POA: Diagnosis not present

## 2017-07-09 LAB — POCT INR: INR: 2.4

## 2017-07-11 MED FILL — ROSUVASTATIN CALCIUM 10 MG: 10 | 30 days supply | Qty: 30 | Fill #2

## 2017-07-11 MED FILL — METOPROLOL SUCC ER 100 MG T: 100 | 30 days supply | Qty: 30 | Fill #1

## 2017-07-17 ENCOUNTER — Encounter: Payer: Self-pay | Admitting: Internal Medicine

## 2017-07-17 ENCOUNTER — Ambulatory Visit (INDEPENDENT_AMBULATORY_CARE_PROVIDER_SITE_OTHER): Payer: Medicaid Other | Admitting: Internal Medicine

## 2017-07-17 VITALS — BP 122/70 | HR 76 | Ht 63.0 in | Wt 256.0 lb

## 2017-07-17 DIAGNOSIS — G4733 Obstructive sleep apnea (adult) (pediatric): Secondary | ICD-10-CM

## 2017-07-17 DIAGNOSIS — I4891 Unspecified atrial fibrillation: Secondary | ICD-10-CM | POA: Diagnosis not present

## 2017-07-17 NOTE — Patient Instructions (Signed)
Order- Would like new DME to replace old, worn out CPAP machine, auto 5-20, mask of choice, humidifier, supplies, Airview  Dx OSA   Please call as needed

## 2017-07-17 NOTE — Assessment & Plan Note (Signed)
On healthy weight range for her body frame, likely significantly complicate several of her medical problems including OSA, HBP. Counseling.

## 2017-07-17 NOTE — Assessment & Plan Note (Signed)
Pulse regular consistent with sinus rhythm or very well-controlled atrial fibrillation by palpation at this visit. Followed by cardiology.

## 2017-07-17 NOTE — Assessment & Plan Note (Signed)
Qualifies for CPAP and asks for a new DME company. We will use auto Pap and add Airview.

## 2017-07-17 NOTE — Progress Notes (Signed)
07/17/17- 61 year old female never smoker for sleep medicine evaluation.  NPSG 02/11/17      AHI 30.9/ hr, desaturation to 85%, CPAP titrated to 10 cwp,    Body weight 258 lbs Self Referral. DME: [facility]. Had recent Sleep study 01-2017. Pt needs new CPAP with all supplies. Pt's mask is not fitting well either.  She describes witnessed apnea, daytime sleepiness and loud snoring. Previous sleep study years ago-wore out CPAP machine and hasn't been using it regularly, but remembers it is helpful. Denies ENT surgery, lung disease. Treated for atrial fibrillation on Coumadin, GERD, HBP. She wants to get restarted on CPAP.  Prior to Admission medications   Medication Sig Start Date End Date Taking? Authorizing Provider  acetaminophen (TYLENOL ARTHRITIS PAIN) 650 MG CR tablet Take 1,300 mg by mouth 2 (two) times daily as needed for pain.   Yes [provider]  amLODipine (NORVASC) 5 MG tablet Take 1 tablet (5 mg total) by mouth 2 (two) times daily. Patient taking differently: Take 5 mg by mouth daily.  06/20/16  Yes Nahser, Wonda Cheng, MD  B Complex-C (B-COMPLEX WITH VITAMIN C) tablet Take 1 tablet by mouth daily.   Yes [provider]  Cholecalciferol (VITAMIN D) 2000 UNITS CAPS Take 2,000 Units by mouth every evening.   Yes [provider]  Cinnamon 500 MG capsule Take 500 mg by mouth daily.    Yes [provider]  metoprolol succinate (TOPROL-XL) 100 MG 24 hr tablet Take 1 tablet by mouth daily. 07/11/17  Yes [provider]  omeprazole (PRILOSEC) 20 MG capsule Take 20 mg by mouth daily.    Yes [provider]  rosuvastatin (CRESTOR) 10 MG tablet Take 10 mg by mouth at bedtime.    Yes [provider]  warfarin (COUMADIN) 5 MG tablet TAKE BY MOUTH AS DIRECTED BY COUMADIN CLINIC 06/19/17  Yes Nahser, Wonda Cheng, MD   Past Medical History:  Diagnosis Date  . A-fib ([place])   . Acid reflux   . Arthritis   . Dyslipidemia   . Fatigue   . GERD  (gastroesophageal reflux disease)   . History of hysterectomy   . Hx of echocardiogram    Echocardiogram (12/15): EF 55-60%, normal wall motion, PASP 32 mmHg  . Hyperlipidemia   . Hypertension   . Hypokalemia   . Obesity   . Personal history of hypertensive heart disease   . Personal history of long-term (current) use of anticoagulants   . Shingles    Past Surgical History:  Procedure Laterality Date  . ABDOMINAL HYSTERECTOMY    . CARDIAC CATHETERIZATION  04/15/2007   Est. EF of 50-55% -- Smooth and normal coronary arteries -- Normal left ventricular systolic function.  We will continue with further treatment of her atrial fibrillation     . PARTIAL HYSTERECTOMY     Family History  Problem Relation Age of Onset  . Coronary artery disease Unknown   . Hypertension Unknown   . Heart failure Cousin   . Hypertension Son   . Heart attack Neg Hx   . Stroke Neg Hx    Social History   Social History  . Marital status: Married    Spouse name: N/A  . Number of children: N/A  . Years of education: N/A   Occupational History  . Not on file.   Social History Main Topics  . Smoking status: Never Smoker  . Smokeless tobacco: Never Used  . Alcohol use No  . Drug use: No  .  Sexual activity: Not on file   Other Topics Concern  . Not on file   Social History Narrative  . No narrative on file   ROS-see HPI   + = pos Constitutional:    weight loss, night sweats, fevers, chills, fatigue, lassitude. HEENT:    headaches, difficulty swallowing, tooth/dental problems, sore throat,       sneezing, itching, ear ache, nasal congestion, post nasal drip, snoring CV:    chest pain, orthopnea, PND, swelling in lower extremities, anasarca,                                                            dizziness, palpitations Resp:   shortness of breath with exertion or at rest.                productive cough,   non-productive cough, coughing up of blood.              change in color of mucus.   wheezing.   Skin:    rash or lesions. GI:  No-   heartburn, indigestion, abdominal pain, nausea, vomiting, diarrhea,                 change in bowel habits, loss of appetite GU: dysuria, change in color of urine, no urgency or frequency.   flank pain. MS:   joint pain, stiffness, decreased range of motion, back pain. Neuro-     nothing unusual Psych:  change in mood or affect.  depression or anxiety.   memory loss.  OBJ- Physical Exam General- Alert, Oriented, Affect-appropriate, Distress- none acute, obese  Skin- rash-none, lesions- none, excoriation- none Lymphadenopathy- none Head- atraumatic            Eyes- Gross vision intact, PERRLA, conjunctivae and secretions clear            Ears- Hearing, canals-normal            Nose- Clear, no-Septal dev, mucus, polyps, erosion, perforation             Throat- Mallampati II-III , mucosa clear , drainage- none, tonsils- atrophic, has lower teeth, upper full denture Neck- flexible , trachea midline, no stridor , thyroid nl, carotid no bruit Chest - symmetrical excursion , unlabored           Heart/CV- RRR , no murmur , no gallop  , no rub, nl s1 s2                           - JVD- none , edema- none, stasis changes- none, varices- none           Lung- clear to P&A, wheeze- none, cough- none , dullness-none, rub- none           Chest wall-  Abd-  Br/ Gen/ Rectal- Not done, not indicated Extrem- cyanosis- none, clubbing, none, atrophy- none, strength- nl Neuro- grossly intact to observation

## 2017-07-17 NOTE — Assessment & Plan Note (Signed)
>>  ASSESSMENT AND PLAN FOR ATRIAL FIBRILLATION WITH RVR ([facility]) WRITTEN ON 07/17/2017  5:17 PM BY YOUNG, CLINTON D, MD  Pulse regular consistent with sinus rhythm or very well-controlled atrial fibrillation by palpation at this visit. Followed by cardiology.

## 2017-07-19 MED FILL — OMEPRAZOLE 20 MG CAP: 20 | 30 days supply | Qty: 30 | Fill #3

## 2017-08-13 MED FILL — METOPROLOL SUCC ER 100 MG T: 100 | 30 days supply | Qty: 30 | Fill #2

## 2017-08-13 MED FILL — OMEPRAZOLE 20 MG CAP: 20 | 30 days supply | Qty: 30 | Fill #4

## 2017-08-13 MED FILL — ROSUVASTATIN CALCIUM 10 MG: 10 | 30 days supply | Qty: 30 | Fill #3

## 2017-08-20 ENCOUNTER — Ambulatory Visit (INDEPENDENT_AMBULATORY_CARE_PROVIDER_SITE_OTHER): Payer: Medicaid Other | Admitting: Pharmacist

## 2017-08-20 DIAGNOSIS — Z7901 Long term (current) use of anticoagulants: Secondary | ICD-10-CM | POA: Diagnosis not present

## 2017-08-20 DIAGNOSIS — Z5181 Encounter for therapeutic drug level monitoring: Secondary | ICD-10-CM

## 2017-08-20 DIAGNOSIS — I4891 Unspecified atrial fibrillation: Secondary | ICD-10-CM | POA: Diagnosis not present

## 2017-08-20 LAB — POCT INR: INR: 2.6

## 2017-09-04 ENCOUNTER — Telehealth: Payer: Self-pay | Admitting: Internal Medicine

## 2017-09-04 MED FILL — AMLODIPINE BESYLATE 5 MG TA: 5 | 30 days supply | Qty: 30 | Fill #3

## 2017-09-04 MED FILL — METOPROLOL SUCC ER 100 MG T: 100 | 30 days supply | Qty: 30 | Fill #3

## 2017-09-04 NOTE — Telephone Encounter (Signed)
LMTCB Sally E Ottinger ° °

## 2017-09-04 NOTE — Telephone Encounter (Signed)
Spoke to [employer] [email or web address]  and pt has given this info to them as well and they have sumited a auth for the cpap machine and pt is aware she will hear from them once this is authorized [employer]

## 2017-09-14 MED FILL — ROSUVASTATIN CALCIUM 10 MG: 10 | 30 days supply | Qty: 30 | Fill #4

## 2017-09-19 MED FILL — OMEPRAZOLE 20 MG CAP: 20 | 30 days supply | Qty: 30 | Fill #5

## 2017-10-01 ENCOUNTER — Ambulatory Visit (INDEPENDENT_AMBULATORY_CARE_PROVIDER_SITE_OTHER): Payer: Medicaid Other | Admitting: *Deleted

## 2017-10-01 ENCOUNTER — Encounter (INDEPENDENT_AMBULATORY_CARE_PROVIDER_SITE_OTHER): Payer: Self-pay

## 2017-10-01 DIAGNOSIS — I4891 Unspecified atrial fibrillation: Secondary | ICD-10-CM | POA: Diagnosis not present

## 2017-10-01 DIAGNOSIS — Z5181 Encounter for therapeutic drug level monitoring: Secondary | ICD-10-CM

## 2017-10-01 DIAGNOSIS — Z7901 Long term (current) use of anticoagulants: Secondary | ICD-10-CM

## 2017-10-01 LAB — POCT INR: INR: 2

## 2017-10-12 MED FILL — WARFARIN SODIUM 5 MG TABLET: 5 | 90 days supply | Qty: 90 | Fill #1

## 2017-10-12 MED FILL — METOPROLOL SUCC ER 100 MG T: 100 | 30 days supply | Qty: 30 | Fill #0

## 2017-10-12 MED FILL — ROSUVASTATIN CALCIUM 10 MG: 10 | 30 days supply | Qty: 30 | Fill #5

## 2017-10-15 ENCOUNTER — Encounter: Payer: Self-pay | Admitting: Internal Medicine

## 2017-10-17 ENCOUNTER — Ambulatory Visit (INDEPENDENT_AMBULATORY_CARE_PROVIDER_SITE_OTHER): Payer: Medicaid Other | Admitting: Internal Medicine

## 2017-10-17 ENCOUNTER — Encounter: Payer: Self-pay | Admitting: Internal Medicine

## 2017-10-17 VITALS — BP 122/74 | HR 70 | Ht 63.0 in | Wt 259.6 lb

## 2017-10-17 DIAGNOSIS — G4733 Obstructive sleep apnea (adult) (pediatric): Secondary | ICD-10-CM | POA: Diagnosis not present

## 2017-10-17 DIAGNOSIS — I480 Paroxysmal atrial fibrillation: Secondary | ICD-10-CM | POA: Diagnosis not present

## 2017-10-17 MED FILL — OMEPRAZOLE 20 MG CAP: 20 | 30 days supply | Qty: 30 | Fill #0

## 2017-10-17 NOTE — Patient Instructions (Addendum)
We can continue CPAP auto 5-20, mask of choice, humidifier, supplies, AirView    Dx OSA  Our goal is to have you comfortable using CPAP all night, every night, and anytime you sleep. You need to use it at a minimum, 4 hours most nights.   Please call the home care company if you need to

## 2017-10-17 NOTE — Progress Notes (Signed)
07/17/17- 61 year old female never smoker for sleep medicine evaluation.  NPSG 02/11/17      AHI 30.9/ hr, desaturation to 85%, CPAP titrated to 10 cwp,    Body weight 258 lbs Self Referral. DME: [facility]. Had recent Sleep study 01-2017. Pt needs new CPAP with all supplies. Pt's mask is not fitting well either.  She describes witnessed apnea, daytime sleepiness and loud snoring. Previous sleep study years ago-wore out CPAP machine and hasn't been using it regularly, but remembers it is helpful. Denies ENT surgery, lung disease. Treated for atrial fibrillation on Coumadin, GERD, HBP. She wants to get restarted on CPAP.  10/17/17-- 61 year old female never smoker followed for OSA, complicated by A. fib/Coumadin, GERD, HBP CPAP auto 5-10/ [facility] OSA;DME: [facility]. Pt wears CPAP nightly except for when power outage. DL attached.  Download 50% 4 hour compliance with AHI 1.6/hour. A lot of short nights but uses every night. Still falling asleep in her chair before she puts her CPAP on and gets in bed so a lot of sleep hours are not recorded.  ROS-see HPI   + = pos Constitutional:    weight loss, night sweats, fevers, chills, fatigue, lassitude. HEENT:    headaches, difficulty swallowing, tooth/dental problems, sore throat,       sneezing, itching, ear ache, nasal congestion, post nasal drip, snoring CV:    chest pain, orthopnea, PND, swelling in lower extremities, anasarca,                                                            dizziness, + palpitations Resp:   shortness of breath with exertion or at rest.                productive cough,   non-productive cough, coughing up of blood.              change in color of mucus.  wheezing.   Skin:    rash or lesions. GI:  No-   heartburn, indigestion, abdominal pain, nausea, vomiting, diarrhea,                 change in bowel habits, loss of appetite GU: dysuria, change in color of urine, no urgency or frequency.   flank pain. MS:   joint pain, stiffness,  decreased range of motion, back pain. Neuro-     nothing unusual Psych:  change in mood or affect.  depression or anxiety.   memory loss.  OBJ- Physical Exam General- Alert, Oriented, Affect-appropriate, Distress- none acute, + obese  Skin- rash-none, lesions- none, excoriation- none Lymphadenopathy- none Head- atraumatic            Eyes- Gross vision intact, PERRLA, conjunctivae and secretions clear            Ears- Hearing, canals-normal            Nose- Clear, no-Septal dev, mucus, polyps, erosion, perforation             Throat- Mallampati II-III , mucosa clear , drainage- none, tonsils- atrophic, + has lower teeth,                               upper full denture Neck- flexible , trachea midline, no stridor , thyroid  nl, carotid no bruit Chest - symmetrical excursion , unlabored           Heart/CV- RRR , no murmur , no gallop  , no rub, nl s1 s2                           - JVD- none , edema- none, stasis changes- none, varices- none           Lung- clear to P&A, wheeze- none, cough- none , dullness-none, rub- none           Chest wall-  Abd-  Br/ Gen/ Rectal- Not done, not indicated Extrem- cyanosis- none, clubbing, none, atrophy- none, strength- nl Neuro- grossly intact to observation

## 2017-10-25 MED FILL — VESIcare 5 MG TABS: 5 | 30 days supply | Qty: 30 | Fill #0

## 2017-10-28 NOTE — Assessment & Plan Note (Signed)
Again discussed impact of obesity on her medical problems. She has not been motivated enough to change lifestyle sufficiently to accomplish weight loss. Consider bariatric referral.

## 2017-10-28 NOTE — Assessment & Plan Note (Signed)
Good pulse rate control at this visit. She is in sinus rhythm or very well controlled. Followed by cardiology.

## 2017-10-28 NOTE — Assessment & Plan Note (Signed)
CPAP works when she wears it. Her main problem is falling asleep in the evening without it on. Many nights she doesn't get 4 hours of sleep in bed wearing CPAP. We discussed medical benefits and compliance goals.

## 2017-11-09 MED FILL — METOPROLOL SUCC ER 100 MG T: 100 | 30 days supply | Qty: 30 | Fill #1

## 2017-11-09 MED FILL — ROSUVASTATIN CALCIUM 10 MG: 10 | 30 days supply | Qty: 30 | Fill #0

## 2017-11-12 ENCOUNTER — Ambulatory Visit (INDEPENDENT_AMBULATORY_CARE_PROVIDER_SITE_OTHER): Payer: Medicaid Other | Admitting: *Deleted

## 2017-11-12 ENCOUNTER — Encounter (INDEPENDENT_AMBULATORY_CARE_PROVIDER_SITE_OTHER): Payer: Self-pay

## 2017-11-12 DIAGNOSIS — I4891 Unspecified atrial fibrillation: Secondary | ICD-10-CM | POA: Diagnosis not present

## 2017-11-12 DIAGNOSIS — Z5181 Encounter for therapeutic drug level monitoring: Secondary | ICD-10-CM

## 2017-11-12 DIAGNOSIS — Z7901 Long term (current) use of anticoagulants: Secondary | ICD-10-CM | POA: Diagnosis not present

## 2017-11-12 LAB — POCT INR: INR: 2

## 2017-11-12 NOTE — Patient Instructions (Signed)
Today take 1 tablet then continue taking 1 tablet daily except 1/2 tablet Mondays, Wednesdays, and Fridays.  Repeat INR in 6 weeks. Call our office if you start on any new medications or if you have any procedures [phone].

## 2017-11-22 MED FILL — OMEPRAZOLE 20 MG CAP: 20 | 30 days supply | Qty: 30 | Fill #1

## 2017-11-22 MED FILL — IBUPROFEN 800 MG TAB: 800 | 5 days supply | Qty: 21 | Fill #0

## 2017-11-22 MED FILL — traMADol HCL 50 MG TABS: 50 | 5 days supply | Qty: 21 | Fill #0

## 2017-11-27 MED FILL — VESIcare 5 MG TABS: 5 | 30 days supply | Qty: 30 | Fill #1

## 2017-12-03 MED FILL — AMLODIPINE BESYLATE 5 MG TA: 5 | 30 days supply | Qty: 30 | Fill #0

## 2017-12-10 MED FILL — METOPROLOL SUCC ER 100 MG T: 100 | 30 days supply | Qty: 30 | Fill #2

## 2017-12-10 MED FILL — ROSUVASTATIN CALCIUM 10 MG: 10 | 30 days supply | Qty: 30 | Fill #1

## 2017-12-14 MED FILL — OMEPRAZOLE 20 MG CAP: 20 | 30 days supply | Qty: 30 | Fill #2

## 2017-12-24 ENCOUNTER — Other Ambulatory Visit: Payer: Self-pay | Admitting: Family Medicine

## 2017-12-24 DIAGNOSIS — Z1231 Encounter for screening mammogram for malignant neoplasm of breast: Secondary | ICD-10-CM

## 2018-01-01 MED FILL — HYDROCODON-APAP 5-325: 5-325 | 1 days supply | Qty: 4 | Fill #0

## 2018-01-09 MED FILL — METOPROLOL SUCC ER 100 MG T: 100 | 30 days supply | Qty: 30 | Fill #3

## 2018-01-09 MED FILL — OMEPRAZOLE 20 MG CAP: 20 | 30 days supply | Qty: 30 | Fill #3

## 2018-01-09 MED FILL — AMLODIPINE BESYLATE 5 MG TA: 5 | 30 days supply | Qty: 30 | Fill #1

## 2018-01-09 MED FILL — ROSUVASTATIN CALCIUM 10 MG: 10 | 30 days supply | Qty: 30 | Fill #2

## 2018-01-10 MED FILL — VESIcare 5 MG TABS: 5 | 30 days supply | Qty: 30 | Fill #2

## 2018-01-22 MED FILL — PANTOPRAZOLE SOD DR 40 MG T: 40 | 30 days supply | Qty: 30 | Fill #0

## 2018-01-23 ENCOUNTER — Ambulatory Visit (INDEPENDENT_AMBULATORY_CARE_PROVIDER_SITE_OTHER): Payer: Medicaid Other | Admitting: Pharmacist

## 2018-01-23 DIAGNOSIS — I4891 Unspecified atrial fibrillation: Secondary | ICD-10-CM

## 2018-01-23 DIAGNOSIS — Z7901 Long term (current) use of anticoagulants: Secondary | ICD-10-CM | POA: Diagnosis not present

## 2018-01-23 DIAGNOSIS — Z5181 Encounter for therapeutic drug level monitoring: Secondary | ICD-10-CM

## 2018-01-23 LAB — POCT INR: INR: 2.1

## 2018-01-23 MED ORDER — WARFARIN SODIUM 5 MG PO TABS: ORAL_TABLET | ORAL | 1 refills | Status: DC

## 2018-01-23 MED FILL — WARFARIN SODIUM 5 MG TABLET: 5 | 90 days supply | Qty: 90 | Fill #0

## 2018-01-23 NOTE — Patient Instructions (Signed)
Description   Continue taking 1 tablet daily except 1/2 tablet Mondays, Wednesdays, and Fridays.  Repeat INR in 6 weeks. Call our office if you start on any new medications or if you have any procedures [phone].

## 2018-02-01 ENCOUNTER — Ambulatory Visit
Admission: RE | Admit: 2018-02-01 | Discharge: 2018-02-01 | Disposition: A | Discharge status: Home / Self Care | Payer: Medicaid Other | Source: Ambulatory Visit | Attending: Family Medicine | Admitting: Family Medicine

## 2018-02-01 DIAGNOSIS — Z1231 Encounter for screening mammogram for malignant neoplasm of breast: Secondary | ICD-10-CM

## 2018-02-04 ENCOUNTER — Other Ambulatory Visit: Payer: Self-pay | Admitting: Family Medicine

## 2018-02-04 DIAGNOSIS — R928 Other abnormal and inconclusive findings on diagnostic imaging of breast: Secondary | ICD-10-CM

## 2018-02-04 MED FILL — PANTOPRAZOLE SOD DR 20 MG T: 20 | 30 days supply | Qty: 30 | Fill #0

## 2018-02-06 ENCOUNTER — Ambulatory Visit
Admission: RE | Admit: 2018-02-06 | Discharge: 2018-02-06 | Disposition: A | Discharge status: Home / Self Care | Payer: Medicaid Other | Source: Ambulatory Visit | Attending: Family Medicine | Admitting: Family Medicine

## 2018-02-06 ENCOUNTER — Other Ambulatory Visit: Payer: Self-pay | Admitting: Family Medicine

## 2018-02-06 DIAGNOSIS — R928 Other abnormal and inconclusive findings on diagnostic imaging of breast: Secondary | ICD-10-CM

## 2018-02-06 DIAGNOSIS — N6310 Unspecified lump in the right breast, unspecified quadrant: Secondary | ICD-10-CM

## 2018-02-07 MED FILL — ROSUVASTATIN CALCIUM 10 MG: 10 | 30 days supply | Qty: 30 | Fill #3

## 2018-02-07 MED FILL — METOPROLOL SUCCINATE ER 100: 100 | 30 days supply | Qty: 30 | Fill #4

## 2018-02-19 ENCOUNTER — Ambulatory Visit (INDEPENDENT_AMBULATORY_CARE_PROVIDER_SITE_OTHER): Payer: Medicaid Other | Admitting: Internal Medicine

## 2018-02-19 ENCOUNTER — Encounter: Payer: Self-pay | Admitting: Internal Medicine

## 2018-02-19 DIAGNOSIS — G4733 Obstructive sleep apnea (adult) (pediatric): Secondary | ICD-10-CM

## 2018-02-19 DIAGNOSIS — I4819 Other persistent atrial fibrillation: Secondary | ICD-10-CM

## 2018-02-19 DIAGNOSIS — I481 Persistent atrial fibrillation: Secondary | ICD-10-CM | POA: Diagnosis not present

## 2018-02-19 NOTE — Assessment & Plan Note (Signed)
Appropriate ventricular response rate on exam at this visit, managed by cardiology.

## 2018-02-19 NOTE — Patient Instructions (Signed)
Normal adults do best if they get 7-8 hours of sleep per night. I think you will be less sleepy in the day time if you can try to get more sleep with your CPAP.  Remember our goal is to wear CPAP all the time, any time you are sleeping.  Order- DME [facility]- Please resize nasal pillows for better fit.  We can continue auto 5-20, mask of choice, humidifier supplies, AirView

## 2018-02-19 NOTE — Assessment & Plan Note (Signed)
I again encouraged her to make lifestyle changes towards weight loss.

## 2018-02-19 NOTE — Progress Notes (Signed)
07/17/17- 62 year old female never smoker for sleep medicine evaluation.  NPSG 02/11/17      AHI 30.9/ hr, desaturation to 85%, CPAP titrated to 10 cwp,    Body weight 258 lbs Self Referral. DME: [facility]. Had recent Sleep study 01-2017. Pt needs new CPAP with all supplies. Pt's mask is not fitting well either.  She describes witnessed apnea, daytime sleepiness and loud snoring. Previous sleep study years ago-wore out CPAP machine and hasn't been using it regularly, but remembers it is helpful. Denies ENT surgery, lung disease. Treated for atrial fibrillation on Coumadin, GERD, HBP. She wants to get restarted on CPAP.  02/19/18- 62 year old female never smoker followed for OSA, complicated by atrial fib/Coumadin, GERD, HBP CPAP auto 5-20/ [facility]     OSA: DME: [facility]. Pt uses CPAP about 3.5 hours each night-will need new supplies. DL attached.  Download 40% compliance with four hour goal but she is wearing it every night, average 3 hours 42 minutes.  AHI 1.1/hour.  She is taking mask off in her sleep.  She would like larger nasal pillows. She does recognize she is sleeping better with CPAP but still sleepy in the daytime.  Reports bedtime between midnight and 1 AM, getting up between 5 and 6 AM.  I explained this is not enough sleep time for her.  ROS-see HPI   + = pos Constitutional:    weight loss, night sweats, fevers, chills, +fatigue, lassitude. HEENT:    headaches, difficulty swallowing, tooth/dental problems, sore throat,       sneezing, itching, ear ache, nasal congestion, post nasal drip, snoring CV:    chest pain, orthopnea, PND, swelling in lower extremities, anasarca,                                                            dizziness, palpitations Resp:   shortness of breath with exertion or at rest.                productive cough,   non-productive cough, coughing up of blood.              change in color of mucus.  wheezing.   Skin:    rash or lesions. GI:  No-   heartburn,  indigestion, abdominal pain, nausea, vomiting, diarrhea,                 change in bowel habits, loss of appetite GU: dysuria, change in color of urine, no urgency or frequency.   flank pain. MS:   joint pain, stiffness, decreased range of motion, back pain. Neuro-     nothing unusual Psych:  change in mood or affect.  depression or anxiety.   memory loss.  OBJ- Physical Exam General- Alert, Oriented, Affect-appropriate, Distress- none acute, +morbidly obese  Skin- rash-none, lesions- none, excoriation- none Lymphadenopathy- none Head- atraumatic            Eyes- Gross vision intact, PERRLA, conjunctivae and secretions clear            Ears- Hearing, canals-normal            Nose- Clear, no-Septal dev, mucus, polyps, erosion, perforation             Throat- Mallampati II-III , mucosa clear , drainage- none, tonsils- atrophic, has lower teeth,  upper full denture Neck- flexible , trachea midline, no stridor , thyroid nl, carotid no bruit Chest - symmetrical excursion , unlabored           Heart/CV- IRR + , no murmur , no gallop  , no rub, nl s1 s2                           - JVD- none , edema- none, stasis changes- none, varices- none           Lung- clear to P&A, wheeze- none, cough- none , dullness-none, rub- none           Chest wall-  Abd-  Br/ Gen/ Rectal- Not done, not indicated Extrem- cyanosis- none, clubbing, none, atrophy- none, strength- nl Neuro- grossly intact to observation

## 2018-02-19 NOTE — Assessment & Plan Note (Signed)
She is getting good control of apnea with her CPAP, but taking the mask off in her sleep.  We can continue current pressure range 5-20.  Request DME refit her nasal pillows for comfort.  Patient educated again about compliance goals and advised weight loss.

## 2018-02-22 MED FILL — AMLODIPINE BESYLATE 5 MG TA: 5 | 30 days supply | Qty: 30 | Fill #2

## 2018-02-22 MED FILL — VESIcare 5 MG TABS: 5 | 30 days supply | Qty: 30 | Fill #3

## 2018-02-27 MED FILL — DEXILANT DR 30 MG CAPSULE: 30 | 30 days supply | Qty: 30 | Fill #0

## 2018-03-06 ENCOUNTER — Ambulatory Visit (INDEPENDENT_AMBULATORY_CARE_PROVIDER_SITE_OTHER): Payer: Medicaid Other | Admitting: *Deleted

## 2018-03-06 DIAGNOSIS — Z5181 Encounter for therapeutic drug level monitoring: Secondary | ICD-10-CM

## 2018-03-06 DIAGNOSIS — I481 Persistent atrial fibrillation: Secondary | ICD-10-CM

## 2018-03-06 DIAGNOSIS — I4819 Other persistent atrial fibrillation: Secondary | ICD-10-CM

## 2018-03-06 DIAGNOSIS — I4891 Unspecified atrial fibrillation: Secondary | ICD-10-CM

## 2018-03-06 DIAGNOSIS — Z7901 Long term (current) use of anticoagulants: Secondary | ICD-10-CM

## 2018-03-06 LAB — POCT INR: INR: 2.5

## 2018-03-06 NOTE — Patient Instructions (Signed)
Description   Continue taking 1 tablet daily except 1/2 tablet Mondays, Wednesdays, and Fridays.  Repeat INR in 6 weeks. Call our office if you start on any new medications or if you have any procedures [phone].

## 2018-03-11 MED FILL — METOPROLOL SUCCINATE ER 100: 100 | 30 days supply | Qty: 30 | Fill #5

## 2018-03-11 MED FILL — ROSUVASTATIN CALCIUM 10 MG: 10 | 30 days supply | Qty: 30 | Fill #4

## 2018-04-03 ENCOUNTER — Telehealth: Payer: Self-pay | Admitting: Internal Medicine

## 2018-04-03 NOTE — Telephone Encounter (Signed)
Spoke with [facility], Libby who agreed to have patient come to her office to see what is going on with the letter patient received. Patient came back and handed [place] the letter. Libby read through and conveyed to the patient that there isn't anything the patient needs to do but that the home health company needs to send some more information. There isn't anything on patient's part or that we need to do at this time. Patient thanked [place] for helping her with the letter. Nothing further is needed at this time.

## 2018-04-10 MED FILL — ROSUVASTATIN CALCIUM 10 MG: 10 | 30 days supply | Qty: 30 | Fill #5

## 2018-04-10 MED FILL — DEXILANT DR 30 MG CAPSULE: 30 | 30 days supply | Qty: 15 | Fill #0

## 2018-04-10 MED FILL — METOPROLOL SUCCINATE ER 100: 100 | 30 days supply | Qty: 30 | Fill #0

## 2018-04-17 ENCOUNTER — Ambulatory Visit (INDEPENDENT_AMBULATORY_CARE_PROVIDER_SITE_OTHER): Payer: Medicaid Other | Admitting: *Deleted

## 2018-04-17 DIAGNOSIS — Z7901 Long term (current) use of anticoagulants: Secondary | ICD-10-CM | POA: Diagnosis not present

## 2018-04-17 DIAGNOSIS — Z5181 Encounter for therapeutic drug level monitoring: Secondary | ICD-10-CM

## 2018-04-17 DIAGNOSIS — I4891 Unspecified atrial fibrillation: Secondary | ICD-10-CM

## 2018-04-17 LAB — POCT INR: INR: 2.1

## 2018-04-17 NOTE — Patient Instructions (Signed)
Description   Continue taking 1 tablet daily except 1/2 tablet Mondays, Wednesdays, and Fridays.  Repeat INR in 6 weeks. Call our office if you start on any new medications or if you have any procedures [phone].

## 2018-05-10 MED FILL — METOPROLOL SUCCINATE ER 100: 100 | 30 days supply | Qty: 30 | Fill #1

## 2018-05-10 MED FILL — ROSUVASTATIN CALCIUM 10 MG: 10 | 30 days supply | Qty: 30 | Fill #0

## 2018-05-10 MED FILL — WARFARIN SODIUM 5 MG TABLET: 5 | 90 days supply | Qty: 90 | Fill #1

## 2018-05-10 MED FILL — AMLODIPINE BESYLATE 5 MG TA: 5 | 30 days supply | Qty: 30 | Fill #0

## 2018-05-27 MED FILL — VESIcare 5 MG TABS: 5 | 30 days supply | Qty: 30 | Fill #4

## 2018-05-29 ENCOUNTER — Ambulatory Visit (INDEPENDENT_AMBULATORY_CARE_PROVIDER_SITE_OTHER): Payer: Medicaid Other | Admitting: Pharmacist

## 2018-05-29 DIAGNOSIS — Z7901 Long term (current) use of anticoagulants: Secondary | ICD-10-CM

## 2018-05-29 DIAGNOSIS — Z5181 Encounter for therapeutic drug level monitoring: Secondary | ICD-10-CM | POA: Diagnosis not present

## 2018-05-29 DIAGNOSIS — I4891 Unspecified atrial fibrillation: Secondary | ICD-10-CM | POA: Diagnosis not present

## 2018-05-29 LAB — POCT INR: INR: 2 (ref 2.0–3.0)

## 2018-05-29 NOTE — Patient Instructions (Signed)
Description   Continue taking 1 tablet daily except 1/2 tablet Mondays, Wednesdays, and Fridays.  Repeat INR in 6 weeks. Call our office if you start on any new medications or if you have any procedures [phone].

## 2018-06-06 MED FILL — ROSUVASTATIN CALCIUM 10 MG: 10 | 30 days supply | Qty: 30 | Fill #1

## 2018-06-06 MED FILL — METOPROLOL SUCCINATE ER 100: 100 | 30 days supply | Qty: 30 | Fill #2

## 2018-06-06 MED FILL — AMLODIPINE BESYLATE 5 MG TA: 5 | 30 days supply | Qty: 30 | Fill #1

## 2018-07-04 MED FILL — METOPROLOL SUCCINATE ER 100: 100 | 30 days supply | Qty: 30 | Fill #3

## 2018-07-04 MED FILL — ROSUVASTATIN CALCIUM 10 MG: 10 | 30 days supply | Qty: 30 | Fill #2

## 2018-07-04 MED FILL — AMLODIPINE BESYLATE 5 MG TA: 5 | 30 days supply | Qty: 30 | Fill #2

## 2018-07-05 LAB — GLUCOSE, POCT (MANUAL RESULT ENTRY): POC Glucose: 122 mg/dl — AB (ref 70–99)

## 2018-07-10 ENCOUNTER — Encounter (INDEPENDENT_AMBULATORY_CARE_PROVIDER_SITE_OTHER): Payer: Self-pay

## 2018-07-10 ENCOUNTER — Ambulatory Visit (INDEPENDENT_AMBULATORY_CARE_PROVIDER_SITE_OTHER): Payer: Medicaid Other | Admitting: *Deleted

## 2018-07-10 DIAGNOSIS — I4891 Unspecified atrial fibrillation: Secondary | ICD-10-CM

## 2018-07-10 DIAGNOSIS — Z5181 Encounter for therapeutic drug level monitoring: Secondary | ICD-10-CM

## 2018-07-10 DIAGNOSIS — Z7901 Long term (current) use of anticoagulants: Secondary | ICD-10-CM

## 2018-07-10 LAB — POCT INR: INR: 1.6 — AB (ref 2.0–3.0)

## 2018-07-10 NOTE — Patient Instructions (Signed)
Description   Today July 17th take 1 tablet then continue taking 1 tablet daily except 1/2 tablet Mondays, Wednesdays, and Fridays.  Repeat INR in 2 weeks. Call our office if you start on any new medications or if you have any procedures [phone].

## 2018-07-24 ENCOUNTER — Ambulatory Visit (INDEPENDENT_AMBULATORY_CARE_PROVIDER_SITE_OTHER): Payer: Medicaid Other | Admitting: Pharmacist

## 2018-07-24 DIAGNOSIS — Z7901 Long term (current) use of anticoagulants: Secondary | ICD-10-CM

## 2018-07-24 DIAGNOSIS — I4891 Unspecified atrial fibrillation: Secondary | ICD-10-CM

## 2018-07-24 DIAGNOSIS — Z5181 Encounter for therapeutic drug level monitoring: Secondary | ICD-10-CM

## 2018-07-24 LAB — POCT INR: INR: 2 (ref 2.0–3.0)

## 2018-07-24 NOTE — Patient Instructions (Signed)
Description   Continue taking 1 tablet daily except 1/2 tablet Mondays, Wednesdays, and Fridays.  Repeat INR in 4 weeks. Call our office if you start on any new medications or if you have any procedures [phone].

## 2018-07-30 ENCOUNTER — Other Ambulatory Visit: Payer: Self-pay | Admitting: Cardiovascular Disease

## 2018-07-30 MED FILL — METOPROLOL SUCCINATE ER 100: 100 | 30 days supply | Qty: 30 | Fill #4

## 2018-07-30 MED FILL — ROSUVASTATIN CALCIUM 10 MG: 10 | 30 days supply | Qty: 30 | Fill #3

## 2018-07-30 MED FILL — WARFARIN SODIUM 5 MG TABLET: 5 | 90 days supply | Qty: 90 | Fill #0

## 2018-08-07 ENCOUNTER — Ambulatory Visit
Admission: RE | Admit: 2018-08-07 | Discharge: 2018-08-07 | Disposition: A | Discharge status: Home / Self Care | Payer: Medicaid Other | Source: Ambulatory Visit | Attending: Family Medicine | Admitting: Family Medicine

## 2018-08-07 ENCOUNTER — Other Ambulatory Visit: Payer: Self-pay | Admitting: Family Medicine

## 2018-08-07 DIAGNOSIS — N6310 Unspecified lump in the right breast, unspecified quadrant: Secondary | ICD-10-CM

## 2018-08-07 DIAGNOSIS — R921 Mammographic calcification found on diagnostic imaging of breast: Secondary | ICD-10-CM

## 2018-08-19 ENCOUNTER — Ambulatory Visit: Payer: Medicaid Other | Admitting: Internal Medicine

## 2018-08-21 ENCOUNTER — Ambulatory Visit (INDEPENDENT_AMBULATORY_CARE_PROVIDER_SITE_OTHER): Payer: Medicaid Other | Admitting: *Deleted

## 2018-08-21 DIAGNOSIS — I4891 Unspecified atrial fibrillation: Secondary | ICD-10-CM

## 2018-08-21 DIAGNOSIS — Z7901 Long term (current) use of anticoagulants: Secondary | ICD-10-CM | POA: Diagnosis not present

## 2018-08-21 DIAGNOSIS — Z5181 Encounter for therapeutic drug level monitoring: Secondary | ICD-10-CM

## 2018-08-21 LAB — POCT INR: INR: 2.5 (ref 2.0–3.0)

## 2018-08-21 NOTE — Patient Instructions (Signed)
Description   Continue taking 1 tablet daily except 1/2 tablet Mondays, Wednesdays, and Fridays.  Repeat INR in 4 weeks. Call our office if you start on any new medications or if you have any procedures [phone].

## 2018-08-29 MED FILL — AMLODIPINE BESYLATE 5 MG TA: 5 | 30 days supply | Qty: 30 | Fill #3

## 2018-08-29 MED FILL — ROSUVASTATIN CALCIUM 10 MG: 10 | 30 days supply | Qty: 30 | Fill #4

## 2018-08-29 MED FILL — METOPROLOL SUCCINATE ER 100: 100 | 30 days supply | Qty: 30 | Fill #5

## 2018-10-03 MED FILL — ROSUVASTATIN CALCIUM 10 MG: 10 | 30 days supply | Qty: 30 | Fill #5

## 2018-10-04 MED FILL — METOPROLOL SUCCINATE ER 100: 100 | 30 days supply | Qty: 30 | Fill #0

## 2018-10-15 ENCOUNTER — Encounter: Payer: Self-pay | Admitting: Internal Medicine

## 2018-10-17 ENCOUNTER — Encounter: Payer: Self-pay | Admitting: Internal Medicine

## 2018-10-17 ENCOUNTER — Ambulatory Visit (INDEPENDENT_AMBULATORY_CARE_PROVIDER_SITE_OTHER): Payer: Medicaid Other | Admitting: Internal Medicine

## 2018-10-17 VITALS — BP 160/84 | HR 74 | Ht 63.0 in | Wt 262.4 lb

## 2018-10-17 DIAGNOSIS — I4891 Unspecified atrial fibrillation: Secondary | ICD-10-CM

## 2018-10-17 DIAGNOSIS — G4733 Obstructive sleep apnea (adult) (pediatric): Secondary | ICD-10-CM

## 2018-10-17 NOTE — Progress Notes (Signed)
HPI female never smoker followed for OSA, complicated by atrial fib/Coumadin, GERD, HBP NPSG 02/11/17      AHI 30.9/ hr, desaturation to 85%, CPAP titrated to 10 cwp,    Body weight 258 lbs  ---------------------------------------------------------------------------- 02/19/18- 62 year old female never smoker followed for OSA, complicated by atrial fib/Coumadin, GERD, HBP CPAP auto 5-20/ [facility]     OSA: DME: [facility]. Pt uses CPAP about 3.5 hours each night-will need new supplies. DL attached.  Download 40% compliance with four hour goal but she is wearing it every night, average 3 hours 42 minutes.  AHI 1.1/hour.  She is taking mask off in her sleep.  She would like larger nasal pillows. She does recognize she is sleeping better with CPAP but still sleepy in the daytime.  Reports bedtime between midnight and 1 AM, getting up between 5 and 6 AM.  I explained this is not enough sleep time for her.  10/17/2018- 62 year old female never smoker followed for OSA, complicated by atrial fib/Coumadin, GERD, HBP CPAP auto 5-20/ [facility] -----Follows For: OSA, doing well on CPAP, sneezing, allergy flare up possible, recieved flu shot from Dr. Nancy Fetter Body weight today 262 pounds BP 160/84 She thinks she has been using CPAP at least 4 hours per night.  We reviewed download which shows compliance 50% for our goal, AHI 0.8/hour.  She says it is comfortable but she needs a new nasal pillows mask.  Admits to some nasal stuffiness which is persistent, without much sneezing or drainage.  ROS-see HPI   + = positive Constitutional:    weight loss, night sweats, fevers, chills, +fatigue, lassitude. HEENT:    headaches, difficulty swallowing, tooth/dental problems, sore throat,       sneezing, itching, ear ache, +nasal congestion, post nasal drip, snoring CV:    chest pain, orthopnea, PND, swelling in lower extremities, anasarca,                                                            dizziness, palpitations Resp:    shortness of breath with exertion or at rest.                productive cough,   non-productive cough, coughing up of blood.              change in color of mucus.  wheezing.   Skin:    rash or lesions. GI:  No-   heartburn, indigestion, abdominal pain, nausea, vomiting, diarrhea,                 change in bowel habits, loss of appetite GU: dysuria, change in color of urine, no urgency or frequency.   flank pain. MS:   joint pain, stiffness, decreased range of motion, back pain. Neuro-     nothing unusual Psych:  change in mood or affect.  depression or anxiety.   memory loss.  OBJ- Physical Exam General- Alert, Oriented, Affect-appropriate, Distress- none acute, +morbidly obese  Skin- rash-none, lesions- none, excoriation- none Lymphadenopathy- none Head- atraumatic            Eyes- Gross vision intact, PERRLA, conjunctivae and secretions clear            Ears- Hearing, canals-normal            Nose-+ turbinate edema, no-Septal  dev, mucus, polyps, erosion, perforation             Throat- Mallampati II-III , mucosa clear , drainage- none, tonsils- atrophic, has lower teeth, upper full denture Neck- flexible , trachea midline, no stridor , thyroid nl, carotid no bruit Chest - symmetrical excursion , unlabored           Heart/CV- IRR + , no murmur , no gallop  , no rub, nl s1 s2                           - JVD- none , edema- none, stasis changes- none, varices- none           Lung- clear to P&A, wheeze- none, cough- none , dullness-none, rub- none           Chest wall-  Abd-  Br/ Gen/ Rectal- Not done, not indicated Extrem- cyanosis- none, clubbing, none, atrophy- none, strength- nl Neuro- grossly intact to observation

## 2018-10-17 NOTE — Assessment & Plan Note (Signed)
She is not making any significant effort towards weight loss.  This was encouraged with discussion.

## 2018-10-17 NOTE — Assessment & Plan Note (Signed)
>>  ASSESSMENT AND PLAN FOR ATRIAL FIBRILLATION WITH RVR (HCC) WRITTEN ON 10/17/2018 11:27 AM BY YOUNG, CLINTON D, MD  Rate is adequately controlled at this visit.  Followed by cardiology.

## 2018-10-17 NOTE — Assessment & Plan Note (Signed)
She recognizes benefit from CPAP with improved sleep quality.  Still short sleeper and I think she makes up the difference with daytime naps that she is not as much aware of.  We reviewed compliance goals again. Plan-continue CPAP auto 5-20, replace old mask and supplies

## 2018-10-17 NOTE — Patient Instructions (Signed)
Order- DME [facility] please replace old mask of choice and supplies, continue auto 5-20, humidifier, AirView  Suggest you try otc Flonase( fluticasone) nasal spray   1-2 puffs each nostril every night at bedtime. See if this helps with the stuffiness.  Keep trying to wear your CPAP all night, every night, and whenever you sleep. We want it to help protect your heart and brain from low oxygen levels.   Please call if we can help.

## 2018-10-17 NOTE — Assessment & Plan Note (Signed)
Rate is adequately controlled at this visit.  Followed by cardiology.

## 2018-10-24 ENCOUNTER — Ambulatory Visit (INDEPENDENT_AMBULATORY_CARE_PROVIDER_SITE_OTHER): Payer: Medicaid Other | Admitting: *Deleted

## 2018-10-24 DIAGNOSIS — I4891 Unspecified atrial fibrillation: Secondary | ICD-10-CM | POA: Diagnosis not present

## 2018-10-24 DIAGNOSIS — Z5181 Encounter for therapeutic drug level monitoring: Secondary | ICD-10-CM

## 2018-10-24 DIAGNOSIS — Z7901 Long term (current) use of anticoagulants: Secondary | ICD-10-CM | POA: Diagnosis not present

## 2018-10-24 LAB — POCT INR: INR: 2 (ref 2.0–3.0)

## 2018-10-24 NOTE — Patient Instructions (Signed)
Description   Today take 1.5 tablets then continue taking 1 tablet daily except 1/2 tablet Mondays, Wednesdays, and Fridays.  Repeat INR in 5 weeks. Call our office if you start on any new medications or if you have any procedures [phone].

## 2018-10-30 MED FILL — METOPROLOL SUCCINATE ER 100: 100 | 30 days supply | Qty: 30 | Fill #1

## 2018-10-30 MED FILL — ROSUVASTATIN CALCIUM 10 MG: 10 | 30 days supply | Qty: 30 | Fill #0

## 2018-11-26 MED FILL — WARFARIN SODIUM 5 MG TABLET: 5 | 90 days supply | Qty: 90 | Fill #1

## 2018-11-26 MED FILL — AMLODIPINE BESYLATE 5 MG TA: 5 | 30 days supply | Qty: 15 | Fill #0

## 2018-11-26 MED FILL — ROSUVASTATIN CALCIUM 10 MG: 10 | 30 days supply | Qty: 30 | Fill #0

## 2018-11-26 MED FILL — METOPROLOL SUCCINATE ER 100: 100 | 30 days supply | Qty: 30 | Fill #0

## 2018-11-27 ENCOUNTER — Telehealth: Payer: Self-pay

## 2018-11-27 NOTE — Telephone Encounter (Signed)
   [facility] Pre-operative Risk Assessment    Request for surgical clearance:  1. What type of surgery is being performed?  Colonoscopy/endoscopy   2. When is this surgery scheduled?  12/30/18   3. What type of clearance is required (medical clearance vs. Pharmacy clearance to hold med vs. Both)?  both  4. Are there any medications that need to be held prior to surgery and how long? Coumadin   5. Practice name and name of physician performing surgery?  [facility]/ Dr Alessandra Bevels   6. What is your office phone number [phone]    7.   What is your office fax number [phone]  8.   Anesthesia type (None, local, MAC, general) ?  Gwyneth Sprout  Domino Holten 11/27/2018, 3:09 PM  _________________________________________________________________   (provider comments below)

## 2018-11-28 ENCOUNTER — Ambulatory Visit (INDEPENDENT_AMBULATORY_CARE_PROVIDER_SITE_OTHER): Payer: Medicaid Other

## 2018-11-28 DIAGNOSIS — I4891 Unspecified atrial fibrillation: Secondary | ICD-10-CM | POA: Diagnosis not present

## 2018-11-28 DIAGNOSIS — Z5181 Encounter for therapeutic drug level monitoring: Secondary | ICD-10-CM

## 2018-11-28 DIAGNOSIS — Z7901 Long term (current) use of anticoagulants: Secondary | ICD-10-CM | POA: Diagnosis not present

## 2018-11-28 LAB — POCT INR: INR: 2.3 (ref 2.0–3.0)

## 2018-11-28 NOTE — Telephone Encounter (Signed)
   Primary Cardiologist:Philip Nahser, MD  Chart reviewed as part of pre-operative protocol coverage. Because of Jacqueline Petersen's past medical history and time since last visit, he/she will require a follow-up visit in order to better assess preoperative cardiovascular risk.  Well overdue for visit - 02/2017 was last visit. Need to see patient before we can make formal recs for clearance.  Pre-op covering staff: - Please schedule appointment and call patient to inform them. - Please contact requesting surgeon's office via preferred method (i.e, phone, fax) to inform them of need for appointment prior to surgery.  If applicable, this message will also be routed to pharmacy pool and/or primary cardiologist for input on holding anticoagulant/antiplatelet agent as requested below so that this information is available at time of patient's appointment.   Charlie Pitter, PA-C  11/28/2018, 5:24 PM

## 2018-11-28 NOTE — Patient Instructions (Signed)
Description   Continue taking 1 tablet daily except 1/2 tablet Mondays, Wednesdays, and Fridays.  Repeat INR in 6 weeks. OK to hold Coumadin 5 days prior to colonoscopy on 12/30/18 per Fuller Canada, PharmD, pending cardiac clearance from Dr Acie Fredrickson. Call our office if you start on any new medications or if you have any procedures [phone].

## 2018-11-29 NOTE — Telephone Encounter (Signed)
1st attempt : Lvm letting pt know to call our office and schedule appointment for surgical clearance.

## 2018-11-29 NOTE — Telephone Encounter (Signed)
Patient with diagnosis of atrial fibrillation on warfarin for anticoagulation.    Procedure: colonoscopy/endoscopy Date of procedure: 12/30/18  CHADS2-VASc score of  2 (, HTN, , female)  CrCl 193 Platelet count 332 (from 2017)  Per office protocol, patient can hold warfarin for 5 days prior to procedure.    Patient will not need bridging with Lovenox (enoxaparin) around procedure.

## 2018-12-04 NOTE — Telephone Encounter (Signed)
2nd attempt: LVM  For patient to call back and schedule appointment for surgical clearance.

## 2018-12-06 NOTE — Telephone Encounter (Signed)
Pt has been scheduled to see Dr. Acie Fredrickson 12/10/18.

## 2018-12-10 ENCOUNTER — Encounter: Payer: Self-pay | Admitting: Cardiovascular Disease

## 2018-12-10 ENCOUNTER — Ambulatory Visit (INDEPENDENT_AMBULATORY_CARE_PROVIDER_SITE_OTHER): Payer: Medicaid Other | Admitting: Cardiovascular Disease

## 2018-12-10 VITALS — BP 152/88 | HR 69 | Ht 63.0 in | Wt 262.8 lb

## 2018-12-10 DIAGNOSIS — I10 Essential (primary) hypertension: Secondary | ICD-10-CM | POA: Diagnosis not present

## 2018-12-10 DIAGNOSIS — E782 Mixed hyperlipidemia: Secondary | ICD-10-CM | POA: Diagnosis not present

## 2018-12-10 MED ORDER — SPIRONOLACTONE 25 MG PO TABS: 25.0000 mg | ORAL_TABLET | Freq: Every day | ORAL | 3 refills | Status: DC

## 2018-12-10 MED FILL — SPIRONOLACTONE 25 MG TABS: 25 | 30 days supply | Qty: 30 | Fill #0

## 2018-12-10 NOTE — Progress Notes (Signed)
[address] [place], [place]  [address] Phone: [phone] Fax:  [phone]  Date:  12/10/2018   ID:  Jacqueline Petersen 21-Jul-1956, MRN [id number]  PCP:  [facility].  Problem list: 1. Hypertension 2. Hyperlipidemia 3. Paroxysmal atrial fibrillation 4. Obesity 5. Obstructive sleep apnea 6. PVCs       Jacqueline Petersen is a 62 y.o. female with a hx of HTN, HL, paroxysmal atrial fibrillation previously on Coumadin, GERD and obesity. LHC (03/2007): Normal coronary arteries, EF 50-55%. Patient was recently admitted 12/10-12/13 after presenting with palpitations. She was found to be in atrial fibrillation with RVR. She converted to NSR on IV diltiazem. It was felt that she needed long-term anticoagulation. CHADS2-VASc=2.  She was placed on Coumadin. Echocardiogram (12/04/2013): Moderate LVH, EF 50-55%, normal wall motion.  She has generally been doing okay since discharge. She feels fatigued. She's had occasional palpitations. These are fairly consistent with what she has had in the past. She denies any prolonged rapid palpitations. She denies chest pain, dyspnea, syncope, orthopnea, PND or edema. She is compliant with her CPAP.   March 12, 2014: Jacqueline Petersen has a hx of AF in the remote past.   She has normal coronaries.  She was admitted with Afib- and converted with dilt drip.  She continues to have problems with HTN.  She complains about pain and firmness in her left leg.  She has continued to gain weight He has a history of sleep apnea. She does not look like wearing her CPAP mask because it is uncomfortable at night.  Sept. 16, 2015:  Pt is doing well.  I have met her in the past.  She was seen by Jacqueline Petersen in March for follow up for her a-fib.  Still eating salt.- diastolic BP is still elevated.  Unable to exercise due to leg pain  Nov. 24, 2015:  Jacqueline Petersen is seen back today for follow up of her HTN. We added Amlodipine at  her last visit.  She has not had any further episodes of atrial fib. She has lots of pain in her knees.  Is scheduled to see an orthopedist soon.  Feb. 1, 2017:  She has  Seen Dr. Caryl Comes since I last seen her. She has significant number of premature ventricular contractions. A 24-hour Holter revealed 11% PVC burden. These have greatly improved when she limited caffeine from her diet.  Signal average ECG was normal   Repeat echocardiogram reveals normal left ventricular systolic function. She is doing well.   No dyspnea, no CP  Her CPAP is not working.    She needs to have right knee replacement .     Sept. 6, 2017:  Doing well.  No further palpitations  - better since she is avoiding caffiene.  March 05, 2017:  Jacqueline Petersen is seen today. Feeling sluggish. Using her CPAP - fairly well 4 hrs at night   Dec. 17, 2019 Jacqueline Petersen is seen today for follow-up of her paroxysmal atrial fibrillation, hypertension, hyperlipidemia, PVC  She needs to have a colonoscopy and is here partly for preoperative evaluation prior to having her colonoscopy.  Remains in NSR  Wt is 263 lbs ,  Wt is up 6 lbs since I last saw her )  Exercises daily   Wt Readings from Last 3 Encounters:  12/10/18 262 lb 12.8 oz (119.2 kg)  10/17/18 262 lb 6.4 oz (119 kg)  02/19/18 259 lb 12.8 oz (117.8 kg)  Past Medical History:  Diagnosis Date  . A-fib ([place])   . Acid reflux   . Arthritis   . Dyslipidemia   . Fatigue   . GERD (gastroesophageal reflux disease)   . History of hysterectomy   . Hx of echocardiogram    Echocardiogram (12/15): EF 55-60%, normal wall motion, PASP 32 mmHg  . Hyperlipidemia   . Hypertension   . Hypokalemia   . Obesity   . Personal history of hypertensive heart disease   . Personal history of long-term (current) use of anticoagulants   . Shingles     Current Outpatient Medications  Medication Sig Dispense Refill  . acetaminophen (TYLENOL ARTHRITIS PAIN) 650 MG CR tablet Take 1,300  mg by mouth 2 (two) times daily as needed for pain.    Marland Kitchen amLODipine (NORVASC) 5 MG tablet Take 5 mg by mouth daily.    . B Complex-C (B-COMPLEX WITH VITAMIN C) tablet Take 1 tablet by mouth daily.    . Cholecalciferol (VITAMIN D) 2000 UNITS CAPS Take 2,000 Units by mouth every evening.    . Cinnamon 500 MG capsule Take 500 mg by mouth daily.     . metoprolol succinate (TOPROL-XL) 100 MG 24 hr tablet Take 1 tablet by mouth daily.  3  . rosuvastatin (CRESTOR) 10 MG tablet Take 10 mg by mouth at bedtime.     Marland Kitchen warfarin (COUMADIN) 5 MG tablet TAKE BY MOUTH AS DIRECTED BY COUMADIN CLINIC 90 tablet 1   No current facility-administered medications for this visit.     Allergies:   Lipitor [atorvastatin calcium]   Social History:  The patient  reports that she has never smoked. She has never used smokeless tobacco. She reports that she does not drink alcohol or use drugs.   Family History:  The patient's family history includes Coronary artery disease in her unknown relative; Heart failure in her cousin; Hypertension in her son and unknown relative.   ROS:  Please see the history of present illness.   She denies any bleeding problems. She has had some recent URI symptoms. She denies fever.   All other systems reviewed and negative.   Physical Exam: Blood pressure (!) 152/88, pulse 69, height 5' 3" (1.6 m), weight 262 lb 12.8 oz (119.2 kg), SpO2 98 %.  GEN:  Well nourished, well developed in no acute distress HEENT: Normal NECK: No JVD; No carotid bruits LYMPHATICS: No lymphadenopathy CARDIAC: RRR  RESPIRATORY:  Clear to auscultation without rales, wheezing or rhonchi  ABDOMEN:    Obese, + BS  MUSCULOSKELETAL:  No edema; No deformity  SKIN: Warm and dry NEUROLOGIC:  Alert and oriented x 3   EKG:    December 10, 2018: Normal sinus rhythm at 69 beats minute.  Left axis deviation.  Otherwise normal EKG.   ASSESSMENT AND PLAN:  1. Hypertension -   BP is elevated.  Needs to lose weight   Still eating salty snacks and other salty foods Will add Aldactone 25 mg a day  Nurse visit and BMP in 2-3 weeks   2. Hyperlipidemia -   Managed by primary   3. Paroxysmal atrial fibrillation - in NSR ,  on coumadni OK to hold coumadin for 5 days prior to colonoscopy   4. Obesity -   Needs to loose weight   5. Obstructive sleep apnea -     Jacqueline Moores, MD  12/10/2018 9:16 AM    [facility] [address]  [address]  [place], [place]  [address] Pager [phone] Phone: [phone]; Fax: [phone]

## 2018-12-10 NOTE — Patient Instructions (Signed)
Medication Instructions:  Your physician has recommended you make the following change in your medication:  1.) start Aldactone (spironolactone) 25 mg once a day (for blood pressure)  If you need a refill on your cardiac medications before your next appointment, please call your pharmacy.   Lab work: On Dec 26, 2018 - BMET If you have labs (blood work) drawn today and your tests are completely normal, you will receive your results only by: Marland Kitchen MyChart Message (if you have MyChart) OR . A paper copy in the mail If you have any lab test that is abnormal or we need to change your treatment, we will call you to review the results.  Testing/Procedures: none  Follow-Up: At [facility], you and your health needs are our priority.  As part of our continuing mission to provide you with exceptional heart care, we have created designated Provider Care Teams.  These Care Teams include your primary Cardiologist (physician) and Advanced Practice Providers (APPs -  Physician Assistants and Nurse Practitioners) who all work together to provide you with the care you need, when you need it. You will need a follow up appointment in:  12 months.  Please call our office 2 months in advance to schedule this appointment.  You may see [name], MD or one of the following Advanced Practice Providers on your designated Care Team: [name], PA-C [place], [place] . Daune Perch, NP  Any Other Special Instructions Will Be Listed Below (If Applicable). You have been scheduled for a nurse visit for blood pressure check on Dec 26, 2018 at 11:00 am along with blood work the same day.

## 2018-12-10 NOTE — Telephone Encounter (Signed)
    Jacqueline Petersen is at low risk for her upcoming colonoscopy. She may hold her Coumadin for 5 days prior to the colonoscopy. Restart  Coumadin as soon as deemed safe by the gastroenterologist. Follow-up with our Coumadin clinic approximately 1 week after colonoscopy.    [name], MD  12/10/2018 9:33 AM    [place],  [place],   [address] Pager [phone] Phone: [phone]; Fax: [phone]

## 2018-12-20 MED FILL — PEG-3350 SOLUTION: 420 | 1 days supply | Qty: 4000 | Fill #0

## 2018-12-23 MED FILL — AMLODIPINE BESYLATE 5 MG TA: 5 | 30 days supply | Qty: 15 | Fill #1

## 2018-12-26 ENCOUNTER — Other Ambulatory Visit: Payer: Medicaid Other | Admitting: *Deleted

## 2018-12-26 ENCOUNTER — Ambulatory Visit (INDEPENDENT_AMBULATORY_CARE_PROVIDER_SITE_OTHER): Payer: Medicaid Other | Admitting: Nurse Practitioner

## 2018-12-26 ENCOUNTER — Telehealth: Payer: Self-pay | Admitting: Cardiovascular Disease

## 2018-12-26 VITALS — BP 140/66 | HR 64 | Ht 63.0 in | Wt 262.0 lb

## 2018-12-26 DIAGNOSIS — I480 Paroxysmal atrial fibrillation: Secondary | ICD-10-CM

## 2018-12-26 DIAGNOSIS — E782 Mixed hyperlipidemia: Secondary | ICD-10-CM

## 2018-12-26 DIAGNOSIS — I10 Essential (primary) hypertension: Secondary | ICD-10-CM | POA: Diagnosis not present

## 2018-12-26 LAB — BASIC METABOLIC PANEL
BUN/Creatinine Ratio: 13 (ref 12–28)
BUN: 12 mg/dL (ref 8–27)
CHLORIDE: 100 mmol/L (ref 96–106)
CO2: 26 mmol/L (ref 20–29)
Calcium: 9 mg/dL (ref 8.7–10.3)
Creatinine, Ser: 0.94 mg/dL (ref 0.57–1.00)
GFR calc Af Amer: 76 mL/min/{1.73_m2} (ref >59)
GFR calc non Af Amer: 66 mL/min/{1.73_m2} (ref >59)
GLUCOSE: 97 mg/dL (ref 65–99)
POTASSIUM: 3.8 mmol/L (ref 3.5–5.2)
Sodium: 140 mmol/L (ref 134–144)

## 2018-12-26 MED ORDER — LOSARTAN POTASSIUM 50 MG PO TABS: 50.0000 mg | ORAL_TABLET | Freq: Every day | ORAL | 11 refills | Status: DC

## 2018-12-26 NOTE — Telephone Encounter (Signed)
Spoke with patient and advised that her lab results from today (BMET) are all within normal limits. She states she does not want to start Losartan 50 mg. States she is going to work hard on better diet and losing weight and agrees to call back if BP remains elevated. I advised her of the negative impact of uncontrolled hypertension and she verbalized understanding. She thanked me for the call.

## 2018-12-26 NOTE — Telephone Encounter (Signed)
New message     Pt stated that she does not want to take losartan (COZAAR) 50 MG tablet. Pt stated that she wants test results

## 2018-12-26 NOTE — Progress Notes (Signed)
1.) Reason for visit: BP check and bmet  2.) Name of MD requesting visit: Dr. Acie Fredrickson  3.) H&P: Patient presents for recheck of BP since being advised to start Spironolactone 25 mg daily at ov on 12/17 with Dr. Acie Fredrickson. She is alert and oriented to person, place, time and is NAD.  4.) ROS related to problem: Patient states she developed facial and lip swelling after a few days of Spironolactone so she stopped the medication. She denies respiratory symptoms with the reaction. She is here today for BP recheck which is measured at 140/66 mmHg.   5.) Assessment and plan per MD: Reviewed with Dr. Lovena Le, DOD, who advised that patient start Losartan 50 mg daily and recheck BMET in approximately 1 week. Patient had BMET today and will await results before picking up Losartan from pharmacy. She is scheduled for lab recheck on 1/16 when she is at our office for CVRR appointment. She expresses concern about effect of medication on kidney function. I advised that I will call with today's lab results and if her kidney function is not WNL, her plan of care will be changed. She verbalized understanding and agreement.   \

## 2018-12-26 NOTE — Patient Instructions (Signed)
Medication Instructions:  Your physician has recommended you make the following change in your medication:  START Losartan (Cozaar) 50 mg once daily  If you need a refill on your cardiac medications before your next appointment, please call your pharmacy.   Lab work: Your physician recommends that you return for lab work in: 2 weeks on January 16   Testing/Procedures: None Ordered   Follow-Up: At [facility], you and your health needs are our priority.  As part of our continuing mission to provide you with exceptional heart care, we have created designated Provider Care Teams.  These Care Teams include your primary Cardiologist (physician) and Advanced Practice Providers (APPs -  Physician Assistants and Nurse Practitioners) who all work together to provide you with the care you need, when you need it. You will need a follow up appointment in:  1 years.  Please call our office 2 months in advance to schedule this appointment.  You may see [name], MD or one of the following Advanced Practice Providers on your designated Care Team: [name], PA-C [place], [place] . Daune Perch, NP

## 2018-12-27 NOTE — Progress Notes (Signed)
I agree with the note my Christen Bame, RN and the plan to try Losartan.

## 2019-01-03 MED FILL — METOPROLOL SUCCINATE ER 100: 100 | 30 days supply | Qty: 30 | Fill #1

## 2019-01-09 ENCOUNTER — Ambulatory Visit (INDEPENDENT_AMBULATORY_CARE_PROVIDER_SITE_OTHER): Payer: Medicaid Other

## 2019-01-09 ENCOUNTER — Other Ambulatory Visit: Payer: Medicaid Other

## 2019-01-09 DIAGNOSIS — Z5181 Encounter for therapeutic drug level monitoring: Secondary | ICD-10-CM | POA: Diagnosis not present

## 2019-01-09 DIAGNOSIS — I4891 Unspecified atrial fibrillation: Secondary | ICD-10-CM

## 2019-01-09 DIAGNOSIS — Z7901 Long term (current) use of anticoagulants: Secondary | ICD-10-CM | POA: Diagnosis not present

## 2019-01-09 LAB — POCT INR: INR: 1.9 — AB (ref 2.0–3.0)

## 2019-01-09 MED FILL — ROSUVASTATIN CALCIUM 10 MG: 10 | 30 days supply | Qty: 30 | Fill #1

## 2019-01-09 NOTE — Patient Instructions (Signed)
Description   Take 1.5 tablets today, then resume same dosage 1 tablet daily except 1/2 tablet Mondays, Wednesdays, and Fridays.  Repeat INR in 5 weeks.  Call our office if you start on any new medications or if you have any procedures [phone].

## 2019-01-22 MED FILL — AMLODIPINE BESYLATE 5 MG TA: 5 | 30 days supply | Qty: 15 | Fill #2

## 2019-01-31 MED FILL — METOPROLOL SUCCINATE ER 100: 100 | 30 days supply | Qty: 30 | Fill #2

## 2019-02-10 ENCOUNTER — Ambulatory Visit
Admission: RE | Admit: 2019-02-10 | Discharge: 2019-02-10 | Disposition: A | Discharge status: Home / Self Care | Payer: Medicaid Other | Source: Ambulatory Visit | Attending: Family Medicine | Admitting: Family Medicine

## 2019-02-10 DIAGNOSIS — N6310 Unspecified lump in the right breast, unspecified quadrant: Secondary | ICD-10-CM

## 2019-02-10 DIAGNOSIS — R921 Mammographic calcification found on diagnostic imaging of breast: Secondary | ICD-10-CM

## 2019-02-10 MED FILL — ROSUVASTATIN CALCIUM 10 MG: 10 | 30 days supply | Qty: 30 | Fill #2

## 2019-02-13 ENCOUNTER — Ambulatory Visit (INDEPENDENT_AMBULATORY_CARE_PROVIDER_SITE_OTHER): Payer: Medicaid Other | Admitting: *Deleted

## 2019-02-13 DIAGNOSIS — Z7901 Long term (current) use of anticoagulants: Secondary | ICD-10-CM | POA: Diagnosis not present

## 2019-02-13 DIAGNOSIS — I4891 Unspecified atrial fibrillation: Secondary | ICD-10-CM

## 2019-02-13 DIAGNOSIS — Z5181 Encounter for therapeutic drug level monitoring: Secondary | ICD-10-CM

## 2019-02-13 LAB — POCT INR: INR: 1.8 — AB (ref 2.0–3.0)

## 2019-02-13 NOTE — Patient Instructions (Signed)
Description   Take 1.5 tablets today, then resume same dosage 1 tablet daily except 1/2 tablet Mondays, Wednesdays, and Fridays. Please resume normal weekly intake of green intake and not more. Repeat INR in 3 weeks.  Call our office if you start on any new medications or if you have any procedures [phone].

## 2019-02-21 MED FILL — AMLODIPINE BESYLATE 5 MG TA: 5 | 30 days supply | Qty: 15 | Fill #3

## 2019-03-03 MED FILL — METOPROLOL SUCCINATE ER 100: 100 | 30 days supply | Qty: 30 | Fill #3

## 2019-03-06 ENCOUNTER — Other Ambulatory Visit: Payer: Self-pay

## 2019-03-06 ENCOUNTER — Ambulatory Visit (INDEPENDENT_AMBULATORY_CARE_PROVIDER_SITE_OTHER): Payer: Medicaid Other | Admitting: *Deleted

## 2019-03-06 DIAGNOSIS — Z5181 Encounter for therapeutic drug level monitoring: Secondary | ICD-10-CM

## 2019-03-06 DIAGNOSIS — I4891 Unspecified atrial fibrillation: Secondary | ICD-10-CM

## 2019-03-06 DIAGNOSIS — Z7901 Long term (current) use of anticoagulants: Secondary | ICD-10-CM | POA: Diagnosis not present

## 2019-03-06 LAB — POCT INR: INR: 1.9 — AB (ref 2.0–3.0)

## 2019-03-06 NOTE — Patient Instructions (Addendum)
Description   Take 1.5 tablets today, then start taking 1 tablet daily except 1/2 tablet Mondays and Fridays. Please resume normal weekly intake of green intake and not more. Repeat INR in 2 weeks.  Call our office if you start on any new medications or if you have any procedures [phone].

## 2019-03-10 MED FILL — ROSUVASTATIN CALCIUM 10 MG: 10 | 30 days supply | Qty: 30 | Fill #3

## 2019-03-18 ENCOUNTER — Telehealth: Payer: Self-pay

## 2019-03-18 NOTE — Telephone Encounter (Signed)
LMOM FOR PRESCREEN AND DRIVE THRU 

## 2019-03-19 NOTE — Telephone Encounter (Signed)

## 2019-03-20 ENCOUNTER — Ambulatory Visit (INDEPENDENT_AMBULATORY_CARE_PROVIDER_SITE_OTHER): Payer: Medicaid Other | Admitting: Pharmacist

## 2019-03-20 ENCOUNTER — Other Ambulatory Visit: Payer: Self-pay

## 2019-03-20 DIAGNOSIS — Z5181 Encounter for therapeutic drug level monitoring: Secondary | ICD-10-CM

## 2019-03-20 DIAGNOSIS — I4891 Unspecified atrial fibrillation: Secondary | ICD-10-CM

## 2019-03-20 LAB — POCT INR: INR: 2.3 (ref 2.0–3.0)

## 2019-03-20 MED ORDER — APIXABAN 5 MG PO TABS: 5.0000 mg | ORAL_TABLET | Freq: Two times a day (BID) | ORAL | 1 refills | Status: DC

## 2019-03-21 ENCOUNTER — Telehealth: Payer: Self-pay | Admitting: Cardiovascular Disease

## 2019-03-21 NOTE — Telephone Encounter (Signed)
Let patient know that due to the 1/2 life of Eliquis it needs to be taken twice a day to be fully protected from stroke. Patient wanting to know if she cant just take ASA. Reviewing patients chart, it appears her CHA2Ds2VACs score is 2. 2019 Afib guidelines recommend anticoagulation for females with score 3 or more. It appears patient only has a score of 2. Informed patient to continue to take Eliquis. Fatigue is an unusual side effect of Eliquis. Asked patient to continue to take to see if it improves. Patient only started this morning. I will route this to Dr. Cathie Olden to see his opinion on the need for anticoagulation.

## 2019-03-21 NOTE — Telephone Encounter (Signed)
New Message   Pt c/o medication issue:  1. Name of Medication: Eliquis   2. How are you currently taking this medication (dosage and times per day)? 5mg  twice a day   3. Are you having a reaction (difficulty breathing--STAT)? NO  4. What is your medication issue? Patient states that taking two of the Eliquis makes her feel sluggish and she wants to know if she could just start taking one at night.  Please call patient back.

## 2019-03-24 MED FILL — ELIQUIS 5 MG TABLET: 5 | 30 days supply | Qty: 60 | Fill #0

## 2019-03-24 NOTE — Telephone Encounter (Signed)
Called patient and let her know that Dr. Cathie Olden would like her to continue to take Eliquis. Patient states she is taking twice a day. Having no issues currently.

## 2019-03-24 NOTE — Telephone Encounter (Signed)
I would like for her to continue the Eliquis ( and take it BID as prescribed )

## 2019-03-25 ENCOUNTER — Telehealth: Payer: Self-pay | Admitting: Cardiovascular Disease

## 2019-03-25 ENCOUNTER — Encounter: Payer: Self-pay | Admitting: Nurse Practitioner

## 2019-03-25 NOTE — Telephone Encounter (Signed)
New Message   Pt c/o medication issue:  1. Name of Medication: Warfarin, Eliquis  2. How are you currently taking this medication (dosage and times per day)? Not taking Warfarin only Eliquis   3. Are you having a reaction (difficulty breathing--STAT)? No  4. What is your medication issue? Pt says she was taking Warfarin but is not taking Eliquis and she says her fingers, arms, legs and toes go numb  She also says she feels some numbness in her face.  She is wondering if it is from the medication   Please call

## 2019-03-25 NOTE — Telephone Encounter (Signed)
Called patient to discuss her concerns regarding Eliquis. She states she has some numbness in her extremities, fingers and toes. She denies facial or lip swelling, tongue swelling, or changes in her breathing. I advised her if she has any of the facial or breathing changes to stop the medication immediately. She denies swelling and would like to continue the Eliquis at this time. I advised her to call back if symptoms do not improve. She verbalized understanding and agreement and thanked me for the call.

## 2019-03-31 MED FILL — METOPROLOL SUCCINATE ER 100: 100 | 30 days supply | Qty: 30 | Fill #4

## 2019-03-31 MED FILL — AMLODIPINE BESYLATE 5 MG TA: 5 | 30 days supply | Qty: 15 | Fill #4

## 2019-04-02 MED FILL — ROSUVASTATIN CALCIUM 10 MG: 10 | 30 days supply | Qty: 30 | Fill #4

## 2019-04-03 ENCOUNTER — Telehealth: Payer: Self-pay | Admitting: Cardiovascular Disease

## 2019-04-03 NOTE — Telephone Encounter (Signed)
Spoke with patient who states she continues to have numbness in her legs and feet and associates it with starting the Eliquis a few weeks ago (she also called last week to report same symptoms). Denies swelling, change in color in lower extremities and states nothing else has changed in her medical hx. She states she does not mind taking the medication because she likes that she does not have dietary restrictions that she had with coumadin but cannot tolerate this numbness. I advised that I will forward to our Pharmacists for help with how commonly this occurs and if they have any additional advice for her or to change her medication. She verbalized understanding and agreement and thanked me for the call.

## 2019-04-03 NOTE — Telephone Encounter (Signed)
New message   Pt c/o medication issue:  1. Name of Medication: apixaban (ELIQUIS) 5 MG TABS tablet  2. How are you currently taking this medication (dosage and times per day) twice daily  3. Are you having a reaction (difficulty breathing--STAT)?no  4. What is your medication issue? Patient wants to know if this medication can cause symptoms of tingling in arms, fingers and toes. The patient states that she feels numb in arms, fingers and toes. Please advise.

## 2019-04-03 NOTE — Telephone Encounter (Signed)
Numbness in the extremities should not be due to her Eliquis (this is only listed as a side effect in the Eliquis package insert if patients continue on Eliquis during a spinal procedure as a symptom of spinal blood clot or bleeding). Would advise pt to follow up with her PCP regarding this symptom. Another option could be to try Xarelto, although her anticoagulation in general should not be causing any leg numbness.

## 2019-04-03 NOTE — Telephone Encounter (Signed)
Reviewed Megan Supple's advice with patient and offered to change her anticoagulant. I asked if patient is sitting more or for longer periods of time and she admits that since the Covid 19 stay at home orders, she is likely moving around less. She states she will continue Eliquis and monitor her activity level. I advised her not to sit for longer than 60 minutes without getting up to move and to call back if symptoms do not improve. She verbalized understanding and agreement and thanked me for the call.

## 2019-04-07 ENCOUNTER — Telehealth: Payer: Self-pay | Admitting: Cardiovascular Disease

## 2019-04-07 NOTE — Telephone Encounter (Signed)
° °  Pt c/o medication issue:  1. Name of Medication: apixaban (ELIQUIS) 5 MG TABS tablet  2. How are you currently taking this medication (dosage and times per day)? As written  3. Are you having a reaction (difficulty breathing--STAT)? no  4. What is your medication issue? Patient states she forgets to take medication twice a day, wants to know if dose can be changed to once daily

## 2019-04-08 NOTE — Telephone Encounter (Signed)
Spoke with patient who states she forgot to take one of her Eliquis yesterday. She states she is changing the time of day that she takes the medication and hopes this does not happen again. I asked her about placing a reminder somewhere in the house and she says she may still forgot. I asked if she is willing to continue the medication since she has called several times since starting Eliquis and she agrees to continue. I advised her to call back in the future with questions or concerns and she thanked me for the call.

## 2019-04-28 MED FILL — ROSUVASTATIN CALCIUM 10 MG: 10 | 30 days supply | Qty: 30 | Fill #5

## 2019-04-28 MED FILL — AMLODIPINE BESYLATE 5 MG TA: 5 | 30 days supply | Qty: 15 | Fill #5

## 2019-04-28 MED FILL — METOPROLOL SUCCINATE ER 100: 100 | 30 days supply | Qty: 30 | Fill #5

## 2019-04-28 MED FILL — ELIQUIS 5 MG TABLET: 5 | 30 days supply | Qty: 60 | Fill #1

## 2019-05-28 MED FILL — ROSUVASTATIN CALCIUM 10 MG: 10 | 30 days supply | Qty: 30 | Fill #0

## 2019-05-28 MED FILL — METOPROLOL SUCCINATE ER 100: 100 | 30 days supply | Qty: 30 | Fill #0

## 2019-05-28 MED FILL — HYDROCHLOROTHIAZIDE 25 MG T: 25 | 30 days supply | Qty: 30 | Fill #0

## 2019-06-09 ENCOUNTER — Telehealth: Payer: Self-pay | Admitting: Cardiovascular Disease

## 2019-06-09 NOTE — Telephone Encounter (Signed)
New Message      Pt c/o medication issue:  1. Name of Medication: Eliquis  2. How are you currently taking this medication (dosage and times per day)? 5mg  1x daily   3. Are you having a reaction (difficulty breathing--STAT)? No   4. What is your medication issue? Pt is noticing blood in her stool    Please call

## 2019-06-09 NOTE — Telephone Encounter (Signed)
Attempted to contact pt.  VM was full.  Unable to leave message.

## 2019-06-10 ENCOUNTER — Telehealth: Payer: Self-pay

## 2019-06-10 NOTE — Telephone Encounter (Signed)
I tried reaching patient, but mailbox is full.6/16

## 2019-06-11 NOTE — Telephone Encounter (Signed)
Was able to contact patient.  She reports she usually doesn't look at her stool but happened to on Monday and saw something red on her stool.  No blood on toilet tissue or in the water.  She states she thinks it may have been food.  She has not looked again at her stool.  Advised pt to look when she has BM and report if she has any bright red blood or dark tarry looking stools.  Also advised to make [place] aware of any new s/s or bleeding, such as nosebleeds or bleeding from gums.  She denies any increased SOB, fatigue or any other bleeding at this time.  She also states she has been forgetting to take her Eliquis twice a day as instructed and has been only taking it in the evening.  Advised pt this does put her at greater risk for stoke.  Since she has not had any other s/s or seen any further blood/bleeding she will "try" to remember to take Eliquis BID as instructed.  Will further information to Dr Acie Fredrickson and his nurse for their knowledge.

## 2019-06-12 NOTE — Telephone Encounter (Signed)
Keep eye on bowel movements  May have been food If she continues to forget to take 2nd dose of Eliquis then she should be on another med that is daily   She needs to let us know if it is a problem   DO not want her to have a stroke

## 2019-06-13 NOTE — Telephone Encounter (Signed)
Would switch to Xarelto 1x daily 20 mg   Stop Eliquis

## 2019-06-13 NOTE — Telephone Encounter (Signed)
I spoke with pt and gave her information from Dr. Harrington Challenger.  Pt states she continues to have trouble talking Eliquis twice daily.  She forgot one dose yesterday.  She is requesting to change to once daily medication.  She has not looked at bowel movements since calling the other day. I told pt I would send message to provider about making change.  I stressed the importance of taking Eliquis twice daily until medication change could be made.

## 2019-06-16 MED ORDER — RIVAROXABAN 20 MG PO TABS: 20.0000 mg | ORAL_TABLET | Freq: Every day | ORAL | 1 refills | Status: DC

## 2019-06-16 MED FILL — XARELTO 20 MG TABLET: 20 | 90 days supply | Qty: 90 | Fill #0

## 2019-06-16 NOTE — Telephone Encounter (Signed)
I spoke with pt and gave her information from Dr. Harrington Challenger.  Pt instructed to take Xarelto with the evening mail.  Will send prescription to [facility].

## 2019-06-24 MED FILL — HYDROCHLOROTHIAZIDE 25 MG T: 25 | 90 days supply | Qty: 90 | Fill #0

## 2019-06-27 MED FILL — METOPROLOL SUCCINATE ER 100: 100 | 30 days supply | Qty: 30 | Fill #1

## 2019-06-27 MED FILL — ROSUVASTATIN CALCIUM 10 MG: 10 | 30 days supply | Qty: 30 | Fill #1

## 2019-07-18 ENCOUNTER — Telehealth: Payer: Self-pay | Admitting: Internal Medicine

## 2019-07-18 NOTE — Telephone Encounter (Signed)
New Message   Pt c/o medication issue:  1. Name of Medication: rivaroxaban (XARELTO) 20 MG TABS tablet    2. How are you currently taking this medication (dosage and times per day)? Take 1 tablet (20 mg total) by mouth daily with supper.  3. Are you having a reaction (difficulty breathing--STAT)?   4. What is your medication issue? Pain in the bottom of her feet, some numbness and sometimes shortness of breath

## 2019-07-18 NOTE — Telephone Encounter (Signed)
I spoke with pt. She made change from Eliquis to Xarelto last month. For the last 2-3 days she has been having pain and numbness in feet.  Has shortness of breath but reports it is rare. Pt thinks this is related to Xarelto.   I told pt I would send message to Dr Acie Fredrickson for review and we would call her back with his recommendations.

## 2019-07-20 NOTE — Telephone Encounter (Signed)
I dont think the The shortness of breath and numbness in her feet are not related to the Xarelto .  But she may change back to eliquis if she would like.  The are equal from a blood thinning standpoint

## 2019-07-21 MED ORDER — APIXABAN 5 MG PO TABS: 5.0000 mg | ORAL_TABLET | Freq: Two times a day (BID) | ORAL | 5 refills | Status: DC

## 2019-07-21 MED FILL — METOPROLOL SUCCINATE ER 100: 100 | 30 days supply | Qty: 30 | Fill #2

## 2019-07-21 MED FILL — ROSUVASTATIN CALCIUM 10 MG: 10 | 30 days supply | Qty: 30 | Fill #2

## 2019-07-21 MED FILL — ELIQUIS 5 MG TABLET: 5 | 30 days supply | Qty: 60 | Fill #0

## 2019-07-21 NOTE — Telephone Encounter (Signed)
Spoke with pt and she would like to go back to taking Eliquis instead of Xarelto Will forward message to [facility] for directions on switching .Pt did take Xarelto last noc .Adonis Housekeeper

## 2019-07-21 NOTE — Telephone Encounter (Signed)
called patient and informed her to start taking Eliquis this evening when she would be due for her next Xarelto dose. Eliquis 5mg  BID sent to pharmacy

## 2019-08-03 ENCOUNTER — Emergency Department (HOSPITAL_COMMUNITY)
Admission: EM | Admit: 2019-08-03 | Discharge: 2019-08-04 | Disposition: A | Discharge status: Home / Self Care | Payer: Medicaid Other | Attending: Emergency Medicine | Admitting: Emergency Medicine

## 2019-08-03 ENCOUNTER — Emergency Department (HOSPITAL_COMMUNITY): Payer: Medicaid Other

## 2019-08-03 ENCOUNTER — Other Ambulatory Visit: Payer: Self-pay

## 2019-08-03 DIAGNOSIS — R252 Cramp and spasm: Secondary | ICD-10-CM | POA: Diagnosis not present

## 2019-08-03 DIAGNOSIS — R072 Precordial pain: Secondary | ICD-10-CM | POA: Diagnosis not present

## 2019-08-03 DIAGNOSIS — R51 Headache: Secondary | ICD-10-CM | POA: Insufficient documentation

## 2019-08-03 DIAGNOSIS — R519 Headache, unspecified: Secondary | ICD-10-CM

## 2019-08-03 DIAGNOSIS — Z7901 Long term (current) use of anticoagulants: Secondary | ICD-10-CM | POA: Diagnosis not present

## 2019-08-03 DIAGNOSIS — I10 Essential (primary) hypertension: Secondary | ICD-10-CM | POA: Diagnosis not present

## 2019-08-03 DIAGNOSIS — Z79899 Other long term (current) drug therapy: Secondary | ICD-10-CM | POA: Diagnosis not present

## 2019-08-03 LAB — URINALYSIS, ROUTINE W REFLEX MICROSCOPIC
Bacteria, UA: NONE SEEN
Bilirubin Urine: NEGATIVE
Glucose, UA: NEGATIVE mg/dL
Ketones, ur: NEGATIVE mg/dL
Leukocytes,Ua: NEGATIVE
Nitrite: NEGATIVE
Protein, ur: NEGATIVE mg/dL
Specific Gravity, Urine: 1.015 (ref 1.005–1.030)
pH: 7 (ref 5.0–8.0)

## 2019-08-03 MED ORDER — AMLODIPINE BESYLATE 5 MG PO TABS
5.0000 mg | ORAL_TABLET | Freq: Once | ORAL | Status: AC
  Administered 2019-08-04: 5 mg via ORAL
  Filled 2019-08-03: qty 1

## 2019-08-03 NOTE — Discharge Instructions (Addendum)
Please read and follow all provided instructions.  Your diagnoses today include:  1. Acute nonintractable headache, unspecified headache type   2. Essential hypertension   3. Muscle cramps   4. Precordial pain     Tests performed today include: An EKG of your heart Cardiac enzymes - a blood test for heart muscle damage Blood counts and electrolytes CT of your brain - does not show any problems Vital signs. See below for your results today.   Medications prescribed:  None  Take any prescribed medications only as directed.  Follow-up instructions: Please follow-up with your primary care provider this coming week to have your blood pressure rechecked and to discuss your blood pressure medication.   Your potassium level was a little low tonight. You have been given ways to supplement your potassium through diet.   Return instructions:  SEEK IMMEDIATE MEDICAL ATTENTION IF: You have severe chest pain, especially if the pain is crushing or pressure-like and spreads to the arms, back, neck, or jaw, or if you have sweating, nausea (feeling sick to your stomach), or shortness of breath. THIS IS AN EMERGENCY. Don't wait to see if the pain will go away. Get medical help at once. Call 911 or [phone] (operator). DO NOT drive yourself to the hospital.  Your chest pain gets worse and does not go away with rest.  You have an attack of chest pain lasting longer than usual, despite rest and treatment with the medications your caregiver has prescribed.  You wake from sleep with chest pain or shortness of breath. You feel dizzy or faint. You have chest pain not typical of your usual pain for which you originally saw your caregiver.  You have any other emergent concerns regarding your health.  Additional Information: Chest pain comes from many different causes. Your caregiver has diagnosed you as having chest pain that is not specific for one problem, but does not require admission.  You are at low risk for  an acute heart condition or other serious illness.   Your vital signs today were: BP (!) 176/82 (BP Location: Right Arm)    Pulse 66    Temp 98.7 F (37.1 C) (Oral)    Resp 16    Ht 5\' 3"  (1.6 m)    Wt 117.9 kg    SpO2 98%    BMI 46.06 kg/m  If your blood pressure (BP) was elevated above 135/85 this visit, please have this repeated by your doctor within one month. --------------

## 2019-08-03 NOTE — ED Triage Notes (Signed)
Pt states that she noticed that she was feeling tired and checked her it was high. Her BP at home was 164/84. States that she started a new BP medication about a month ago and her dr advised her to let her know if this wasn't working. States she feels like something is "pulling on her head".

## 2019-08-03 NOTE — ED Provider Notes (Signed)
[facility] EMERGENCY DEPARTMENT Provider Note   CSN: [id number] Arrival date & time: 08/03/19  2001     History   Chief Complaint Chief Complaint  Patient presents with  . Hypertension    HPI Jacqueline Petersen is a 63 y.o. female.     Patient presents to the emergency department with multiple complaints.  She has a history of atrial fibrillation and is anticoagulated.  She has a history of high blood pressure.  She states that her blood pressure medication was recently changed.  She reports generalized weakness.  She states that when she woke this morning she could barely walk across the room.  She describes a new type of headache described as a pulling sensation in the top of her head some pressure in her bilateral temples.  She has not had any vision changes, confusion, or vomiting.  Patient denies signs of stroke including: facial droop, slurred speech, aphasia, unilateal weakness/numbness in extremities, imbalance/trouble walking.  She does not report any active chest pain however yesterday she had some "pain in my heart" that did not radiate.  This has not returned today.  It is not exertional and can occur at any time.  Patient denies any neck pain or infectious symptoms such as fever, URI symptoms, cough.  She has not noted any bleeding in her urine or stool.  No abdominal pain.  In addition patient complains of a cramping and spasm in her left foot and toes prior to arrival tonight which was very painful.  The onset of this condition was acute. The course is constant. Aggravating factors: none. Alleviating factors: none.       Past Medical History:  Diagnosis Date  . A-fib ([place])   . Acid reflux   . Arthritis   . Dyslipidemia   . Fatigue   . GERD (gastroesophageal reflux disease)   . History of hysterectomy   . Hx of echocardiogram    Echocardiogram (12/15): EF 55-60%, normal wall motion, PASP 32 mmHg  . Hyperlipidemia   . Hypertension   .  Hypokalemia   . Obesity   . Personal history of hypertensive heart disease   . Personal history of long-term (current) use of anticoagulants   . Shingles     Patient Active Problem List   Diagnosis Date Noted  . Obstructive sleep apnea 07/17/2017  . Morbid obesity due to excess calories ([place]) 07/17/2017  . PVC's (premature ventricular contractions) 01/26/2016  . Long term (current) use of anticoagulants 12/09/2013  . Atrial fibrillation with RVR ([place]) 12/03/2013  . Hyperlipidemia 06/12/2007  . Essential hypertension 06/12/2007  . ATRIAL FIBRILLATION 06/12/2007  . GERD 06/12/2007    Past Surgical History:  Procedure Laterality Date  . ABDOMINAL HYSTERECTOMY    . CARDIAC CATHETERIZATION  04/15/2007   Est. EF of 50-55% -- Smooth and normal coronary arteries -- Normal left ventricular systolic function.  We will continue with further treatment of her atrial fibrillation     . PARTIAL HYSTERECTOMY       OB History   No obstetric history on file.      Home Medications    Prior to Admission medications   Medication Sig Start Date End Date Taking? Authorizing Provider  acetaminophen (TYLENOL ARTHRITIS PAIN) 650 MG CR tablet Take 1,300 mg by mouth 2 (two) times daily as needed for pain.   Yes [provider]  apixaban (ELIQUIS) 5 MG TABS tablet Take 1 tablet (5 mg total) by mouth 2 (two) times  daily. 07/21/19  Yes Nahser, Wonda Cheng, MD  B Complex-C (B-COMPLEX WITH VITAMIN C) tablet Take 1 tablet by mouth daily.   Yes [provider]  Cholecalciferol (VITAMIN D) 2000 UNITS CAPS Take 2,000 Units by mouth every evening.   Yes [provider]  Cinnamon 500 MG capsule Take 500 mg by mouth at bedtime.    Yes [provider]  HYDROCHLOROTHIAZIDE PO Take 1 tablet by mouth daily. Pt not sure about dose; recently started taking in place of Amlodipine 5mg .   Yes [provider]  metoprolol succinate (TOPROL-XL) 100 MG 24 hr tablet Take 1 tablet by  mouth at bedtime.  07/11/17  Yes [provider]  rosuvastatin (CRESTOR) 10 MG tablet Take 10 mg by mouth at bedtime.    Yes [provider]    Family History Family History  Problem Relation Age of Onset  . Coronary artery disease Other   . Hypertension Other   . Heart failure Cousin   . Hypertension Son   . Heart attack Neg Hx   . Stroke Neg Hx     Social History Social History   Tobacco Use  . Smoking status: Never Smoker  . Smokeless tobacco: Never Used  Substance Use Topics  . Alcohol use: No  . Drug use: No     Allergies   Spironolactone and Lipitor [atorvastatin calcium]   Review of Systems Review of Systems  Constitutional: Negative for diaphoresis and fever.  HENT: Negative for congestion, dental problem, rhinorrhea and sinus pressure.   Eyes: Negative for photophobia, discharge, redness and visual disturbance.  Respiratory: Negative for cough and shortness of breath.   Cardiovascular: Positive for chest pain. Negative for palpitations and leg swelling.  Gastrointestinal: Negative for abdominal pain, nausea and vomiting.  Genitourinary: Negative for dysuria.  Musculoskeletal: Positive for myalgias. Negative for back pain, gait problem, neck pain and neck stiffness.  Skin: Negative for rash.  Neurological: Positive for weakness (generalized) and headaches. Negative for syncope, speech difficulty, light-headedness and numbness.  Psychiatric/Behavioral: Negative for confusion. The patient is not nervous/anxious.      Physical Exam Updated Vital Signs BP (!) 176/82 (BP Location: Right Arm)   Pulse 66   Temp 98.7 F (37.1 C) (Oral)   Resp 16   Ht 5\' 3"  (1.6 m)   Wt 117.9 kg   SpO2 98%   BMI 46.06 kg/m   Physical Exam Vitals signs and nursing note reviewed.  Constitutional:      Appearance: She is well-developed. She is not diaphoretic.  HENT:     Head: Normocephalic and atraumatic.     Right Ear: Tympanic membrane, ear canal and  external ear normal.     Left Ear: Tympanic membrane, ear canal and external ear normal.     Nose: Nose normal.     Mouth/Throat:     Mouth: Mucous membranes are not dry.     Pharynx: Uvula midline.  Eyes:     General: Lids are normal.     Extraocular Movements:     Right eye: No nystagmus.     Left eye: No nystagmus.     Conjunctiva/sclera: Conjunctivae normal.     Pupils: Pupils are equal, round, and reactive to light.  Neck:     Musculoskeletal: Normal range of motion and neck supple. No muscular tenderness.     Vascular: Normal carotid pulses. No carotid bruit or JVD.     Trachea: Trachea normal. No tracheal deviation.  Cardiovascular:  Rate and Rhythm: Normal rate and regular rhythm.     Pulses: No decreased pulses.     Heart sounds: Normal heart sounds, S1 normal and S2 normal. No murmur.  Pulmonary:     Effort: Pulmonary effort is normal. No respiratory distress.     Breath sounds: Normal breath sounds. No wheezing.  Chest:     Chest wall: No tenderness.  Abdominal:     General: Bowel sounds are normal.     Palpations: Abdomen is soft.     Tenderness: There is no abdominal tenderness. There is no guarding or rebound.  Musculoskeletal: Normal range of motion.     Cervical back: She exhibits normal range of motion, no tenderness and no bony tenderness.  Skin:    General: Skin is warm and dry.     Coloration: Skin is not pale.  Neurological:     Mental Status: She is alert and oriented to person, place, and time.     GCS: GCS eye subscore is 4. GCS verbal subscore is 5. GCS motor subscore is 6.     Cranial Nerves: No cranial nerve deficit.     Sensory: No sensory deficit.     Coordination: Coordination normal.     Gait: Gait normal.     Deep Tendon Reflexes: Reflexes are normal and symmetric.      ED Treatments / Results  Labs (all labs ordered are listed, but only abnormal results are displayed) Labs Reviewed  BASIC METABOLIC PANEL - Abnormal; Notable for  the following components:      Result Value   Potassium 3.2 (*)    Glucose, Bld 107 (*)    All other components within normal limits  URINALYSIS, ROUTINE W REFLEX MICROSCOPIC - Abnormal; Notable for the following components:   APPearance CLOUDY (*)    Hgb urine dipstick SMALL (*)    All other components within normal limits  CBC  TROPONIN I (HIGH SENSITIVITY)  TROPONIN I (HIGH SENSITIVITY)   ED ECG REPORT   Date: 08/03/2019  Rate: 65  Rhythm: normal sinus rhythm, PVC x 1  QRS Axis: left  Intervals: normal  ST/T Wave abnormalities: nonspecific T wave changes  Conduction Disutrbances:none  Narrative Interpretation:   Old EKG Reviewed: from 12/10/18, t-waves flatter, LAD unchanged  I have personally reviewed the EKG tracing and agree with the computerized printout as noted.  Radiology Ct Head Wo Contrast  Result Date: 08/03/2019 CLINICAL DATA:  63 year old female with headache and hypertension. EXAM: CT HEAD WITHOUT CONTRAST TECHNIQUE: Contiguous axial images were obtained from the base of the skull through the vertex without intravenous contrast. COMPARISON:  Head CT dated 02/18/2011 FINDINGS: Brain: No evidence of acute infarction, hemorrhage, hydrocephalus, extra-axial collection or mass lesion/mass effect. Vascular: No hyperdense vessel or unexpected calcification. Skull: Normal. Negative for fracture or focal lesion. Sinuses/Orbits: No acute finding. Other: None IMPRESSION: No acute intracranial pathology. Electronically Signed   By: Anner Crete M.D.   On: 08/03/2019 23:05    Procedures Procedures (including critical care time)  Medications Ordered in ED Medications  amLODipine (NORVASC) tablet 5 mg (5 mg Oral Given 08/04/19 0019)     Initial Impression / Assessment and Plan / ED Course  I have reviewed the triage vital signs and the nursing notes.  Pertinent labs & imaging results that were available during my care of the patient were reviewed by me and considered  in my medical decision making (see chart for details).  Patient seen and examined. EKG reviewed personally. Work-up initiated. Medications ordered. Given new-type HA, elevated BP, age > 5, anticoagulation -- will obtain head CT given these red flags although patient's neurological exam is reassuring.  Patient had chest pain yesterday and will assess with EKG and troponin.  She has been on a diuretic recently so we will check electrolytes given her complaint of cramping.  Vital signs reviewed and are as follows: BP (!) 176/82 (BP Location: Right Arm)   Pulse 66   Temp 98.7 F (37.1 C) (Oral)   Resp 16   Ht 5\' 3"  (1.6 m)   Wt 117.9 kg   SpO2 98%   BMI 46.06 kg/m   11:36 PM CT images personally reviewed and interpreted. Neg per radiology.   Awaiting lab work-up and administration of medications.   Sign-out to [place] PA-C at shift change. Plan: follow-up on lab work, if okay, pt can be discharged to f/u with PCP for BP management. Only need 1 troponin.    Final Clinical Impressions(s) / ED Diagnoses   Final diagnoses:  Acute nonintractable headache, unspecified headache type  Essential hypertension  Muscle cramps  Precordial pain   HA: Patient with new type headache in setting of hypertension and anticoagulation.  No neurological deficits.  Head CT negative.  Symptoms not consistent with temporal arteritis.  Possibly related to elevated blood pressure.  Hypertension: No signs of endorgan damage.  Awaiting check of kidney function.  Chest pain: Occurred yesterday, EKG nonischemic today.  Troponin pending.  Muscle cramps: Resolved, awaiting check of electrolytes.  ED Discharge Orders    None       Carlisle Cater, Hershal Coria 08/04/19 2313    Quintella Reichert, MD 08/06/19 1045

## 2019-08-04 LAB — CBC
HCT: 40.6 % (ref 36.0–46.0)
Hemoglobin: 13.1 g/dL (ref 12.0–15.0)
MCH: 28.2 pg (ref 26.0–34.0)
MCHC: 32.3 g/dL (ref 30.0–36.0)
MCV: 87.5 fL (ref 80.0–100.0)
Platelets: 292 10*3/uL (ref 150–400)
RBC: 4.64 MIL/uL (ref 3.87–5.11)
RDW: 14.5 % (ref 11.5–15.5)
WBC: 8.9 10*3/uL (ref 4.0–10.5)
nRBC: 0 % (ref 0.0–0.2)

## 2019-08-04 LAB — TROPONIN I (HIGH SENSITIVITY)
Troponin I (High Sensitivity): 4 ng/L (ref <18)
Troponin I (High Sensitivity): 5 ng/L (ref <18)

## 2019-08-04 LAB — BASIC METABOLIC PANEL
Anion gap: 12 (ref 5–15)
BUN: 15 mg/dL (ref 8–23)
CO2: 27 mmol/L (ref 22–32)
Calcium: 9.6 mg/dL (ref 8.9–10.3)
Chloride: 98 mmol/L (ref 98–111)
Creatinine, Ser: 0.76 mg/dL (ref 0.44–1.00)
GFR calc Af Amer: >60 mL/min (ref >60)
GFR calc non Af Amer: >60 mL/min (ref >60)
Glucose, Bld: 107 mg/dL — ABNORMAL HIGH (ref 70–99)
Potassium: 3.2 mmol/L — ABNORMAL LOW (ref 3.5–5.1)
Sodium: 137 mmol/L (ref 135–145)

## 2019-08-04 MED FILL — AMLODIPINE BESYLATE 5 MG TA: 5 | 90 days supply | Qty: 90 | Fill #0

## 2019-08-04 NOTE — ED Provider Notes (Signed)
Head pressure, hadache Anticoagulated, hypertensive Head CT negative Labs pending Norvasc given here for elevated BP  Pending: labs for improved/thorough eval.   Anticipate d/ch home  Labs reviewed and are WNL with exception of mild hypokalemia. On re-examination, she feels asymptomatic. Discussed importance of PCP follow up to discuss her blood pressure treatment.    Charlann Lange, PA-C 08/04/19 0684    Quintella Reichert, MD 08/06/19 [phone]

## 2019-08-12 ENCOUNTER — Telehealth: Payer: Self-pay | Admitting: Cardiovascular Disease

## 2019-08-12 MED ORDER — RIVAROXABAN 20 MG PO TABS: 20.0000 mg | ORAL_TABLET | Freq: Every day | ORAL | 2 refills | Status: DC

## 2019-08-12 MED FILL — XARELTO 20 MG TABLET: 20 | 90 days supply | Qty: 90 | Fill #0

## 2019-08-12 NOTE — Telephone Encounter (Signed)
Left message to call office

## 2019-08-12 NOTE — Telephone Encounter (Signed)
I'm ok with changing back to xarelto 20 mg a day .   She should take the Xarelto with her evening meal.

## 2019-08-12 NOTE — Telephone Encounter (Signed)
New Message   Pt c/o medication issue:  1. Name of Medication: apixaban (ELIQUIS) 5 MG TABS tablet    2. How are you currently taking this medication (dosage and times per day)?   3. Are you having a reaction (difficulty breathing--STAT)?   4. What is your medication issue? Patient is calling because she is not able to take Eliquis because she keep forgetting. She wants to know is there something else.

## 2019-08-12 NOTE — Telephone Encounter (Signed)
Follow up ° ° °Patient is returning call. Please call. °

## 2019-08-12 NOTE — Telephone Encounter (Signed)
I spoke with pt and gave her instructions from Dr. Acie Fredrickson.  Pt aware to stop Eliquis when changing to Xarelto.  Will send prescription to [facility]. Pt prefers 90 day supply.  I spoke with [facility] and confirmed they received prescription for Xarelto and that pt is to stop Eliquis.

## 2019-08-12 NOTE — Telephone Encounter (Signed)
Pt states that she has forgotten multiple doses of Eliquis since being switched because she is not use to being on a BID medication.  She would like to switch back to Xarelto if Dr. Acie Fredrickson is agreeable.  States she doesn't believe the SOB and numbness in her feet were related to the Xarelto after all.  Advised I will send to Dr. Acie Fredrickson for review and advisement.

## 2019-08-26 MED FILL — ROSUVASTATIN CALCIUM 10 MG: 10 | 30 days supply | Qty: 30 | Fill #3

## 2019-08-26 MED FILL — METOPROLOL SUCCINATE ER 100: 100 | 30 days supply | Qty: 30 | Fill #3

## 2019-09-10 ENCOUNTER — Other Ambulatory Visit: Payer: Self-pay

## 2019-09-10 ENCOUNTER — Emergency Department (HOSPITAL_COMMUNITY)
Admission: EM | Admit: 2019-09-10 | Discharge: 2019-09-11 | Disposition: A | Discharge status: Home / Self Care | Payer: Medicaid Other | Attending: Emergency Medicine | Admitting: Emergency Medicine

## 2019-09-10 DIAGNOSIS — N3943 Post-void dribbling: Secondary | ICD-10-CM | POA: Diagnosis not present

## 2019-09-10 DIAGNOSIS — I10 Essential (primary) hypertension: Secondary | ICD-10-CM | POA: Diagnosis not present

## 2019-09-10 DIAGNOSIS — N898 Other specified noninflammatory disorders of vagina: Secondary | ICD-10-CM | POA: Diagnosis not present

## 2019-09-10 DIAGNOSIS — Z7901 Long term (current) use of anticoagulants: Secondary | ICD-10-CM | POA: Insufficient documentation

## 2019-09-10 DIAGNOSIS — Z79899 Other long term (current) drug therapy: Secondary | ICD-10-CM | POA: Diagnosis not present

## 2019-09-10 DIAGNOSIS — Z789 Other specified health status: Secondary | ICD-10-CM

## 2019-09-10 LAB — BASIC METABOLIC PANEL
Anion gap: 10 (ref 5–15)
BUN: 17 mg/dL (ref 8–23)
CO2: 25 mmol/L (ref 22–32)
Calcium: 9.1 mg/dL (ref 8.9–10.3)
Chloride: 104 mmol/L (ref 98–111)
Creatinine, Ser: 0.75 mg/dL (ref 0.44–1.00)
GFR calc Af Amer: >60 mL/min (ref >60)
GFR calc non Af Amer: >60 mL/min (ref >60)
Glucose, Bld: 101 mg/dL — ABNORMAL HIGH (ref 70–99)
Potassium: 3.6 mmol/L (ref 3.5–5.1)
Sodium: 139 mmol/L (ref 135–145)

## 2019-09-10 LAB — URINALYSIS, ROUTINE W REFLEX MICROSCOPIC
Bacteria, UA: NONE SEEN
Bilirubin Urine: NEGATIVE
Glucose, UA: NEGATIVE mg/dL
Ketones, ur: NEGATIVE mg/dL
Leukocytes,Ua: NEGATIVE
Nitrite: NEGATIVE
Protein, ur: NEGATIVE mg/dL
Specific Gravity, Urine: 1.017 (ref 1.005–1.030)
pH: 5 (ref 5.0–8.0)

## 2019-09-10 LAB — CBC
HCT: 40.7 % (ref 36.0–46.0)
Hemoglobin: 12.9 g/dL (ref 12.0–15.0)
MCH: 28 pg (ref 26.0–34.0)
MCHC: 31.7 g/dL (ref 30.0–36.0)
MCV: 88.5 fL (ref 80.0–100.0)
Platelets: 334 10*3/uL (ref 150–400)
RBC: 4.6 MIL/uL (ref 3.87–5.11)
RDW: 14.8 % (ref 11.5–15.5)
WBC: 9.9 10*3/uL (ref 4.0–10.5)
nRBC: 0 % (ref 0.0–0.2)

## 2019-09-10 NOTE — ED Triage Notes (Signed)
Pt reports she started "dribbling" last night while urinating and has continued through out the day. Denies burning. Reports "slimmy" vaginal dc x2 weeks. Pt also reports Right arm pain.

## 2019-09-11 NOTE — ED Notes (Signed)
Discharge instructions discussed with pt. Pt verbalized understanding. Pt stable and ambulatory. No signature pad available. 

## 2019-09-11 NOTE — ED Notes (Signed)
37ml found with bladder scan

## 2019-09-11 NOTE — Discharge Instructions (Signed)
Follow-up with your doctor.   There is also information for urology office if needed. Return to the ED for new or worsening symptoms.

## 2019-09-11 NOTE — ED Provider Notes (Signed)
[facility] Provider Note   CSN: [id number] Arrival date & time: 09/10/19  1955     History   Chief Complaint Chief Complaint  Patient presents with  . Urinary Retention    HPI Jac[facility] Petersen is a 63 y.o. female.     The history is provided by the patient and medical records.     63 y.o. F with hx of AFIB on xarelto, arthritis, GERD, HLP, HTN, presenting to the ED for urinary dribbling.  Patient reports years ago she was on medication for overactive bladder, however stopped taking this because she thought it was going to make her stop urinating altogether.  States lately she feels like she has been double voiding.  States occasionally she will urinate a fair amount, but as soon as she gets back to her chair or bed she feels the urge to urinate again and will only has a few drops.  States this is been happening very frequently recently.  States sometimes she tries to hold her urine to see if that will help, however has not noticed any difference.  She denies any dysuria or hematuria.  No fever or chills.  No abdominal or flank pain.  He does report 2 weeks ago she had some clear vaginal discharge, however this was only present for a day and then resolved spontaneously.  She has not had any pelvic pain and denies any concern for STD.  She is status post hysterectomy.  Past Medical History:  Diagnosis Date  . A-fib ([place])   . Acid reflux   . Arthritis   . Dyslipidemia   . Fatigue   . GERD (gastroesophageal reflux disease)   . History of hysterectomy   . Hx of echocardiogram    Echocardiogram (12/15): EF 55-60%, normal wall motion, PASP 32 mmHg  . Hyperlipidemia   . Hypertension   . Hypokalemia   . Obesity   . Personal history of hypertensive heart disease   . Personal history of long-term (current) use of anticoagulants   . Shingles     Patient Active Problem List   Diagnosis Date Noted  . Obstructive sleep apnea 07/17/2017   . Morbid obesity due to excess calories ([place]) 07/17/2017  . PVC's (premature ventricular contractions) 01/26/2016  . Long term (current) use of anticoagulants 12/09/2013  . Atrial fibrillation with RVR ([place]) 12/03/2013  . Hyperlipidemia 06/12/2007  . Essential hypertension 06/12/2007  . ATRIAL FIBRILLATION 06/12/2007  . GERD 06/12/2007    Past Surgical History:  Procedure Laterality Date  . ABDOMINAL HYSTERECTOMY    . CARDIAC CATHETERIZATION  04/15/2007   Est. EF of 50-55% -- Smooth and normal coronary arteries -- Normal left ventricular systolic function.  We will continue with further treatment of her atrial fibrillation     . PARTIAL HYSTERECTOMY       OB History   No obstetric history on file.      Home Medications    Prior to Admission medications   Medication Sig Start Date End Date Taking? Authorizing Provider  acetaminophen (TYLENOL ARTHRITIS PAIN) 650 MG CR tablet Take 1,300 mg by mouth 2 (two) times daily as needed for pain.    [provider]  B Complex-C (B-COMPLEX WITH VITAMIN C) tablet Take 1 tablet by mouth daily.    [provider]  Cholecalciferol (VITAMIN D) 2000 UNITS CAPS Take 2,000 Units by mouth every evening.    [provider]  Cinnamon 500 MG capsule Take 500 mg  by mouth at bedtime.     [provider]  HYDROCHLOROTHIAZIDE PO Take 1 tablet by mouth daily. Pt not sure about dose; recently started taking in place of Amlodipine 5mg .    [provider]  metoprolol succinate (TOPROL-XL) 100 MG 24 hr tablet Take 1 tablet by mouth at bedtime.  07/11/17   [provider]  rivaroxaban (XARELTO) 20 MG TABS tablet Take 1 tablet (20 mg total) by mouth daily with supper. 08/12/19   Nahser, Wonda Cheng, MD  rosuvastatin (CRESTOR) 10 MG tablet Take 10 mg by mouth at bedtime.     [provider]    Family History Family History  Problem Relation Age of Onset  . Coronary artery disease Other   . Hypertension  Other   . Heart failure Cousin   . Hypertension Son   . Heart attack Neg Hx   . Stroke Neg Hx     Social History Social History   Tobacco Use  . Smoking status: Never Smoker  . Smokeless tobacco: Never Used  Substance Use Topics  . Alcohol use: No  . Drug use: No     Allergies   Spironolactone and Lipitor [atorvastatin calcium]   Review of Systems Review of Systems  Genitourinary: Positive for difficulty urinating.  All other systems reviewed and are negative.    Physical Exam Updated Vital Signs BP (!) 149/63   Pulse 62   Temp 98 F (36.7 C) (Oral)   Resp 16   SpO2 99%   Physical Exam Vitals signs and nursing note reviewed.  Constitutional:      Appearance: She is well-developed.  HENT:     Head: Normocephalic and atraumatic.  Eyes:     Conjunctiva/sclera: Conjunctivae normal.     Pupils: Pupils are equal, round, and reactive to light.  Neck:     Musculoskeletal: Normal range of motion.  Cardiovascular:     Rate and Rhythm: Normal rate and regular rhythm.     Heart sounds: Normal heart sounds.  Pulmonary:     Effort: Pulmonary effort is normal.     Breath sounds: Normal breath sounds.  Abdominal:     General: Bowel sounds are normal.     Palpations: Abdomen is soft.     Comments: Soft, nontender, no significant bladder distention noted, normal bowel sounds  Musculoskeletal: Normal range of motion.  Skin:    General: Skin is warm and dry.  Neurological:     Mental Status: She is alert and oriented to person, place, and time.      ED Treatments / Results  Labs (all labs ordered are listed, but only abnormal results are displayed) Labs Reviewed  URINALYSIS, ROUTINE W REFLEX MICROSCOPIC - Abnormal; Notable for the following components:      Result Value   APPearance HAZY (*)    Hgb urine dipstick SMALL (*)    All other components within normal limits  BASIC METABOLIC PANEL - Abnormal; Notable for the following components:   Glucose, Bld 101  (*)    All other components within normal limits  CBC    EKG None  Radiology No results found.  Procedures Procedures (including critical care time)  Medications Ordered in ED Medications - No data to display   Initial Impression / Assessment and Plan / ED Course  I have reviewed the triage vital signs and the nursing notes.  Pertinent labs & imaging results that were available during my care of the patient were reviewed by me  and considered in my medical decision making (see chart for details).  63 year old female presenting to the ED with urinary dribbling.  Reports she was on medication for overactive bladder years ago but stopped this on her own as she was afraid it would make her stop urinating.  Reports lately she has had several episodes of double voiding, feels like she is not emptying her bladder all the way.  She denies any dysuria or hematuria.  Reports clear vaginal discharge 2 weeks ago for 1 day, but none since that time.  She denies concern for STD.  No pelvic pain.  She is afebrile and nontoxic.  Labs are overall reassuring.  UA without any signs of infection.  Post void residual here with only 72mL retained.  Patient did feel like she urinated more fully while here in the ED.  She may need to be restarted on medications for overactive bladder or incomplete bladder emptying.  Recommended that she follow-up with PCP, I have also given her information for local urology clinic if needed.  She will return here for any new or acute changes.  Final Clinical Impressions(s) / ED Diagnoses   Final diagnoses:  Double voiding    ED Discharge Orders    None       Larene Pickett, PA-C 09/11/19 Daniel, Abrams, DO 09/11/19 [phone]

## 2019-09-16 MED FILL — HYDROCHLOROTHIAZIDE 25 MG T: 25 | 90 days supply | Qty: 90 | Fill #1

## 2019-09-24 MED FILL — ROSUVASTATIN CALCIUM 10 MG: 10 | 30 days supply | Qty: 30 | Fill #4

## 2019-10-20 ENCOUNTER — Ambulatory Visit: Payer: Medicaid Other | Admitting: Internal Medicine

## 2019-10-27 MED FILL — METOPROLOL SUCCINATE ER 100: 100 | 30 days supply | Qty: 30 | Fill #5

## 2019-10-27 MED FILL — ROSUVASTATIN CALCIUM 10 MG: 10 | 30 days supply | Qty: 30 | Fill #5

## 2019-11-10 ENCOUNTER — Encounter (HOSPITAL_COMMUNITY): Payer: Self-pay | Admitting: Emergency Medicine

## 2019-11-10 ENCOUNTER — Other Ambulatory Visit: Payer: Self-pay

## 2019-11-10 ENCOUNTER — Emergency Department (HOSPITAL_COMMUNITY)
Admission: EM | Admit: 2019-11-10 | Discharge: 2019-11-10 | Disposition: A | Discharge status: Home / Self Care | Payer: Medicaid Other | Attending: Emergency Medicine | Admitting: Emergency Medicine

## 2019-11-10 DIAGNOSIS — R5383 Other fatigue: Secondary | ICD-10-CM | POA: Diagnosis present

## 2019-11-10 DIAGNOSIS — Z79899 Other long term (current) drug therapy: Secondary | ICD-10-CM | POA: Insufficient documentation

## 2019-11-10 DIAGNOSIS — Z20828 Contact with and (suspected) exposure to other viral communicable diseases: Secondary | ICD-10-CM | POA: Insufficient documentation

## 2019-11-10 DIAGNOSIS — I10 Essential (primary) hypertension: Secondary | ICD-10-CM | POA: Diagnosis not present

## 2019-11-10 LAB — SARS CORONAVIRUS 2 (TAT 6-24 HRS): SARS Coronavirus 2: NEGATIVE

## 2019-11-10 MED FILL — XARELTO 20 MG TABLET: 20 | 90 days supply | Qty: 90 | Fill #1

## 2019-11-10 NOTE — ED Provider Notes (Signed)
[facility] DE[facility] Provider Note   CSN: [id number] Arrival date & time: 11/10/19  1339     History   Chief Complaint Chief Complaint  Patient presents with  . Fatigue    HPI Jac[facility] Petersen is a 63 y.o. female with past medical history below here for concern of COVID-19 exposure and COVID test.  Reports she moved into a new apartment approximately 2 months ago.  Her landlord collects rent in person and states on November 4 he came to her house to collect rent.  Her neighbor was not wearing a mask and she was not wearing a mask either at that time.  A couple of days ago landlord called her and notified her that he had tested positive for COVID-19.  Patient states lately she has been felt more "fatigued" and tired but has no other symptoms of COVID-19.  States in the past she has felt "off" and fatigued because of poor diet and eating too many sweets however states she is concerned because of her past medical history.  She also has 10 grandchildren and wants to be cautious and would like to be tested for COVID-19 today.  Interventions.  No other associated symptoms.  Denies fever, headaches, bodies, congestion, sore throat, cough, chest pain, shortness of breath, vomiting, diarrhea or abdominal pain.  No dysuria. No palpitations, near syncope.     HPI  Past Medical History:  Diagnosis Date  . A-fib ([place])   . Acid reflux   . Arthritis   . Dyslipidemia   . Fatigue   . GERD (gastroesophageal reflux disease)   . History of hysterectomy   . Hx of echocardiogram    Echocardiogram (12/15): EF 55-60%, normal wall motion, PASP 32 mmHg  . Hyperlipidemia   . Hypertension   . Hypokalemia   . Obesity   . Personal history of hypertensive heart disease   . Personal history of long-term (current) use of anticoagulants   . Shingles     Patient Active Problem List   Diagnosis Date Noted  . Obstructive sleep apnea 07/17/2017  . Morbid obesity due  to excess calories ([place]) 07/17/2017  . PVC's (premature ventricular contractions) 01/26/2016  . Long term (current) use of anticoagulants 12/09/2013  . Atrial fibrillation with RVR ([place]) 12/03/2013  . Hyperlipidemia 06/12/2007  . Essential hypertension 06/12/2007  . ATRIAL FIBRILLATION 06/12/2007  . GERD 06/12/2007    Past Surgical History:  Procedure Laterality Date  . ABDOMINAL HYSTERECTOMY    . CARDIAC CATHETERIZATION  04/15/2007   Est. EF of 50-55% -- Smooth and normal coronary arteries -- Normal left ventricular systolic function.  We will continue with further treatment of her atrial fibrillation     . PARTIAL HYSTERECTOMY       OB History   No obstetric history on file.      Home Medications    Prior to Admission medications   Medication Sig Start Date End Date Taking? Authorizing Provider  acetaminophen (TYLENOL ARTHRITIS PAIN) 650 MG CR tablet Take 1,300 mg by mouth 2 (two) times daily as needed for pain.    [provider]  amLODipine (NORVASC) 5 MG tablet Take 5 mg by mouth daily. 08/04/19   [provider]  B Complex-C (B-COMPLEX WITH VITAMIN C) tablet Take 1 tablet by mouth daily.    [provider]  Cholecalciferol (VITAMIN D) 2000 UNITS CAPS Take 2,000 Units by mouth every evening.    [provider]  Cinnamon 500 MG  capsule Take 500 mg by mouth at bedtime.     [provider]  metoprolol succinate (TOPROL-XL) 100 MG 24 hr tablet Take 1 tablet by mouth at bedtime.  07/11/17   [provider]  rivaroxaban (XARELTO) 20 MG TABS tablet Take 1 tablet (20 mg total) by mouth daily with supper. 08/12/19   Nahser, Wonda Cheng, MD  rosuvastatin (CRESTOR) 10 MG tablet Take 10 mg by mouth at bedtime.     [provider]    Family History Family History  Problem Relation Age of Onset  . Coronary artery disease Other   . Hypertension Other   . Heart failure Cousin   . Hypertension Son   . Heart attack Neg Hx   .  Stroke Neg Hx     Social History Social History   Tobacco Use  . Smoking status: Never Smoker  . Smokeless tobacco: Never Used  Substance Use Topics  . Alcohol use: No  . Drug use: No     Allergies   Spironolactone and Lipitor [atorvastatin calcium]   Review of Systems Review of Systems  Constitutional: Positive for fatigue.  All other systems reviewed and are negative.    Physical Exam Updated Vital Signs BP (!) 151/66 (BP Location: Right Arm)   Pulse 70   Temp 98.1 F (36.7 C) (Oral)   Resp 16   SpO2 98%   Physical Exam Vitals signs and nursing note reviewed.  Constitutional:      General: She is not in acute distress.    Appearance: She is well-developed.     Comments: NAD.  HENT:     Head: Normocephalic and atraumatic.     Right Ear: External ear normal.     Left Ear: External ear normal.     Nose: Nose normal.  Eyes:     General: No scleral icterus.    Conjunctiva/sclera: Conjunctivae normal.  Neck:     Musculoskeletal: Normal range of motion and neck supple.  Cardiovascular:     Rate and Rhythm: Normal rate and regular rhythm.     Heart sounds: Normal heart sounds. No murmur.  Pulmonary:     Effort: Pulmonary effort is normal.     Breath sounds: Normal breath sounds. No wheezing.  Musculoskeletal: Normal range of motion.        General: No deformity.  Skin:    General: Skin is warm and dry.     Capillary Refill: Capillary refill takes less than 2 seconds.  Neurological:     Mental Status: She is alert and oriented to person, place, and time.  Psychiatric:        Behavior: Behavior normal.        Thought Content: Thought content normal.        Judgment: Judgment normal.      ED Treatments / Results  Labs (all labs ordered are listed, but only abnormal results are displayed) Labs Reviewed  SARS CORONAVIRUS 2 (TAT 6-24 HRS)    EKG None  Radiology No results found.  Procedures Procedures (including critical care time)   Medications Ordered in ED Medications - No data to display   Initial Impression / Assessment and Plan / ED Course  I have reviewed the triage vital signs and the nursing notes.  Pertinent labs & imaging results that were available during my care of the patient were reviewed by me and considered in my medical decision making (see chart for details).  64 year old with vague fatigue and intermittent headaches.  No other symptoms.  Her biggest concern is having exposure to COVID-19 from her landlord.  No other symptoms of Covid.  Exam is benign.  Vital signs normal.  She has history of atrial fibrillation but auscultation reveals regular rate and rhythm.  She denies any palpitations, lightheadedness, syncope or near syncope.  COVID-19 swab ordered here.  Given vastly reassuring presentation, exam I do not think further emergent work-up is indicated.  She has no cardiac symptoms.  She has no other infectious symptomatology to point to another or occult etiology of her fatigue.  Recommended close observation of her symptoms, rest, hydration.  Discussed COVID-19 swab is pending and recommended isolation until results come back.  Return precautions discussed.  She is comfortable this.  Final Clinical Impressions(s) / ED Diagnoses   Final diagnoses:  Other fatigue    ED Discharge Orders    None       Kinnie Feil, PA-C 11/10/19 1859    Varney Biles, MD 11/11/19 [phone]

## 2019-11-10 NOTE — ED Notes (Signed)
Patient verbalizes understanding of discharge instructions. Opportunity for questioning and answers were provided. Armband removed by staff, pt discharged from ED.  

## 2019-11-10 NOTE — ED Triage Notes (Signed)
Pt states her landlord has covid and she wants to be tested for it. She has felt very tired, and had a light headache off and on. Denies any SOB and coughing.

## 2019-11-10 NOTE — Discharge Instructions (Addendum)
You were seen in the ER for fatigue.  You were concerned about a possible exposure to COVID-19 so you were tested for this.  You denied any other symptoms of COVID-19.  COVID-19 test results come back in the next 24 to 48 hours.  Someone should call you to notify you of the results however if you make a MyChart account you can check on your results online on the Internet.  See the information on how to make an account.  We tested your for COVID-19 (coronavirus) infection.  It is also possible you could have other viral upper respiratory infection from another virus.  Fatigue could be from dehydration or other causes.  Return to the ER for worsening symptoms or other symptoms that would suggest another infection such as cough, chest pain, shortness of breath, vomiting, diarrhea, urinary symptoms.  Treatment of your symptoms for now will include self-isolation, monitoring of symptoms and supportive care with over-the-counter medicines.    Stay well-hydrated. Rest. You can use over the counter medications to help with symptoms: [phone] mg acetaminophen (tylenol) every 6 hours, around the clock to help with associated fevers, sore throat, headaches, generalized body aches and malaise.  Oxymetazoline (afrin) intranasal spray once daily for no more than 3 days to help with congestion, after 3 days you can switch to another over-the-counter nasal steroid spray such as fluticasone (flonase) Allergy medication (loratadine, cetirizine, etc) and phenylephrine (sudafed) help with nasal congestion, runny nose and postnasal drip.   Dextromethorphan (Delsym) to suppress dry cough. Frequent coughing is likely causing your chest wall pain Guaifenesin (Mucinex) to help expectorate mucus and cough Wash your hands often to prevent spread.  Stay hydrated with plenty of clear fluids Rest   Return to the ED if symptoms are worsening or severe, there is increased work of breathing, chest pain or shortness of breath with  exertion or activity, inability to tolerate fluids due to persistent vomiting despite nausea medicines, passing out, light headedness.  If your test results are POSITIVE, the following isolation requirements need to be met to return to work and resume essential activities: At least 14 days since symptom onset  72 hours of absence of fever without antifever medicine (ibuprofen, acetaminophen). A fever is temperature of 100.85F or greater. Improvement of respiratory symptoms  If your test is NEGATIVE, you may return to work and essential activities as long as your symptoms have improved and you do not have a fever for a total of 3 days.  Call your job and notify them that your test result was negative to see if they will allow you to return to work.     Infection Prevention Recommendations for Individuals Confirmed to have, or Being Evaluated for, or have symptoms of 2019 Novel Coronavirus (COVID-19) Infection Who Receive Care at Home  Individuals who are confirmed to have, or are being evaluated for, COVID-19 should follow the prevention steps below until a healthcare provider or local or state health department says they can return to normal activities.  Stay home except to get medical care You should restrict activities outside your home, except for getting medical care. Do not go to work, school, or public areas, and do not use public transportation or taxis.  Call ahead before visiting your doctor Before your medical appointment, call the healthcare provider and tell them that you have, or are being evaluated for, COVID-19 infection. This will help the healthcare providers office take steps to keep other people from getting infected. Ask your healthcare  provider to call the local or state health department.  Monitor your symptoms Seek prompt medical attention if your illness is worsening (e.g., difficulty breathing). Before going to your medical appointment, call the healthcare provider  and tell them that you have, or are being evaluated for, COVID-19 infection. Ask your healthcare provider to call the local or state health department.  Wear a facemask You should wear a facemask that covers your nose and mouth when you are in the same room with other people and when you visit a healthcare provider. People who live with or visit you should also wear a facemask while they are in the same room with you.  Separate yourself from other people in your home As much as possible, you should stay in a different room from other people in your home. Also, you should use a separate bathroom, if available.  Avoid sharing household items You should not share dishes, drinking glasses, cups, eating utensils, towels, bedding, or other items with other people in your home. After using these items, you should wash them thoroughly with soap and water.  Cover your coughs and sneezes Cover your mouth and nose with a tissue when you cough or sneeze, or you can cough or sneeze into your sleeve. Throw used tissues in a lined trash can, and immediately wash your hands with soap and water for at least 20 seconds or use an alcohol-based hand rub.  Wash your [facility] your hands often and thoroughly with soap and water for at least 20 seconds. You can use an alcohol-based hand sanitizer if soap and water are not available and if your hands are not visibly dirty. Avoid touching your eyes, nose, and mouth with unwashed hands.   Prevention Steps for Caregivers and Household Members of Individuals Confirmed to have, or Being Evaluated for, or have symptoms of 2019 Novel Coronavirus (COVID-19) Infection Being Cared for in the Home  If you live with, or provide care at home for, a person confirmed to have, or being evaluated for, COVID-19 infection please follow these guidelines to prevent infection:  Follow healthcare providers instructions Make sure that you understand and can help the patient follow any  healthcare provider instructions for all care.  Provide for the patients basic needs You should help the patient with basic needs in the home and provide support for getting groceries, prescriptions, and other personal needs.  Monitor the patients symptoms If they are getting sicker, call his or her medical provider and tell them that the patient has, or is being evaluated for, COVID-19 infection. This will help the healthcare providers office take steps to keep other people from getting infected. Ask the healthcare provider to call the local or state health department.  Limit the number of people who have contact with the patient If possible, have only one caregiver for the patient. Other household members should stay in another home or place of residence. If this is not possible, they should stay in another room, or be separated from the patient as much as possible. Use a separate bathroom, if available. Restrict visitors who do not have an essential need to be in the home.  Keep older adults, very young children, and other sick people away from the patient Keep older adults, very young children, and those who have compromised immune systems or chronic health conditions away from the patient. This includes people with chronic heart, lung, or kidney conditions, diabetes, and cancer.  Ensure good ventilation Make sure that shared spaces  in the home have good air flow, such as from an air conditioner or an opened window, weather permitting.  Wash your hands often Wash your hands often and thoroughly with soap and water for at least 20 seconds. You can use an alcohol based hand sanitizer if soap and water are not available and if your hands are not visibly dirty. Avoid touching your eyes, nose, and mouth with unwashed hands. Use disposable paper towels to dry your hands. If not available, use dedicated cloth towels and replace them when they become wet.  Wear a facemask and gloves Wear a  disposable facemask at all times in the room and gloves when you touch or have contact with the patients blood, body fluids, and/or secretions or excretions, such as sweat, saliva, sputum, nasal mucus, vomit, urine, or feces.  Ensure the mask fits over your nose and mouth tightly, and do not touch it during use. Throw out disposable facemasks and gloves after using them. Do not reuse. Wash your hands immediately after removing your facemask and gloves. If your personal clothing becomes contaminated, carefully remove clothing and launder. Wash your hands after handling contaminated clothing. Place all used disposable facemasks, gloves, and other waste in a lined container before disposing them with other household waste. Remove gloves and wash your hands immediately after handling these items.  Do not share dishes, glasses, or other household items with the patient Avoid sharing household items. You should not share dishes, drinking glasses, cups, eating utensils, towels, bedding, or other items with a patient who is confirmed to have, or being evaluated for, COVID-19 infection. After the person uses these items, you should wash them thoroughly with soap and water.  Wash laundry thoroughly Immediately remove and wash clothes or bedding that have blood, body fluids, and/or secretions or excretions, such as sweat, saliva, sputum, nasal mucus, vomit, urine, or feces, on them. Wear gloves when handling laundry from the patient. Read and follow directions on labels of laundry or clothing items and detergent. In general, wash and dry with the warmest temperatures recommended on the label.  Clean all areas the individual has used often Clean all touchable surfaces, such as counters, tabletops, doorknobs, bathroom fixtures, toilets, phones, keyboards, tablets, and bedside tables, every day. Also, clean any surfaces that may have blood, body fluids, and/or secretions or excretions on them. Wear gloves when  cleaning surfaces the patient has come in contact with. Use a diluted bleach solution (e.g., dilute bleach with 1 part bleach and 10 parts water) or a household disinfectant with a label that says EPA-registered for coronaviruses. To make a bleach solution at home, add 1 tablespoon of bleach to 1 quart (4 cups) of water. For a larger supply, add  cup of bleach to 1 gallon (16 cups) of water. Read labels of cleaning products and follow recommendations provided on product labels. Labels contain instructions for safe and effective use of the cleaning product including precautions you should take when applying the product, such as wearing gloves or eye protection and making sure you have good ventilation during use of the product. Remove gloves and wash hands immediately after cleaning.  Monitor yourself for signs and symptoms of illness Caregivers and household members are considered close contacts, should monitor their health, and will be asked to limit movement outside of the home to the extent possible. Follow the monitoring steps for close contacts listed on the symptom monitoring form.  ? If you have additional questions, contact your local health department or  call the epidemiologist on call at [phone] (available 24/7). ? This guidance is subject to change. For the most up-to-date guidance from [facility], please refer to their website: [email or web address]

## 2019-11-25 MED FILL — METOPROLOL SUCCINATE ER 100: 100 | 90 days supply | Qty: 90 | Fill #0

## 2019-11-25 MED FILL — ROSUVASTATIN CALCIUM 10 MG: 10 | 90 days supply | Qty: 90 | Fill #0

## 2019-12-10 ENCOUNTER — Encounter: Payer: Self-pay | Admitting: Cardiovascular Disease

## 2019-12-10 ENCOUNTER — Other Ambulatory Visit: Payer: Self-pay

## 2019-12-10 ENCOUNTER — Ambulatory Visit: Payer: Medicaid Other | Admitting: Cardiovascular Disease

## 2019-12-10 VITALS — BP 132/68 | HR 69 | Ht 63.0 in | Wt 250.4 lb

## 2019-12-10 DIAGNOSIS — E782 Mixed hyperlipidemia: Secondary | ICD-10-CM

## 2019-12-10 DIAGNOSIS — I4819 Other persistent atrial fibrillation: Secondary | ICD-10-CM | POA: Diagnosis not present

## 2019-12-10 DIAGNOSIS — Z7901 Long term (current) use of anticoagulants: Secondary | ICD-10-CM

## 2019-12-10 NOTE — Patient Instructions (Addendum)
Medication Instructions:  Your physician recommends that you continue on your current medications as directed. Please refer to the Current Medication list given to you today.  *If you need a refill on your cardiac medications before your next appointment, please call your pharmacy*  Lab Work: Your physician recommends that you return for lab work in: 6 months on June 16 at 8:00 am or anytime after You do not have to fast for this appointment    Testing/Procedures: None Ordered   Follow-Up: At [facility], you and your health needs are our priority.  As part of our continuing mission to provide you with exceptional heart care, we have created designated Provider Care Teams.  These Care Teams include your primary Cardiologist (physician) and Advanced Practice Providers (APPs -  Physician Assistants and Nurse Practitioners) who all work together to provide you with the care you need, when you need it.  Your next appointment:   1 year(s)  The format for your next appointment:   In Person  Provider:   You may see [name], MD or one of the following Advanced Practice Providers on your designated Care Team:    [name], PA-C  [place], [place]  Daune Perch, [place]

## 2019-12-10 NOTE — Progress Notes (Signed)
[address] [place], [place]  [address] Phone: [phone] Fax:  [phone]  Date:  12/10/2019   ID:  Neko, Mcgeehan 05-31-1956, MRN [id number]  PCP:  [facility].  Problem list: 1. Hypertension 2. Hyperlipidemia 3. Paroxysmal atrial fibrillation 4. Obesity 5. Obstructive sleep apnea 6. PVCs       Hiilei Kavitha Lansdale is a 63 y.o. female with a hx of HTN, HL, paroxysmal atrial fibrillation previously on Coumadin, GERD and obesity. LHC (03/2007): Normal coronary arteries, EF 50-55%. Patient was recently admitted 12/10-12/13 after presenting with palpitations. She was found to be in atrial fibrillation with RVR. She converted to NSR on IV diltiazem. It was felt that she needed long-term anticoagulation. CHADS2-VASc=2.  She was placed on Coumadin. Echocardiogram (12/04/2013): Moderate LVH, EF 50-55%, normal wall motion.  She has generally been doing okay since discharge. She feels fatigued. She's had occasional palpitations. These are fairly consistent with what she has had in the past. She denies any prolonged rapid palpitations. She denies chest pain, dyspnea, syncope, orthopnea, PND or edema. She is compliant with her CPAP.   March 12, 2014: Ms. Natarajan has a hx of AF in the remote past.   She has normal coronaries.  She was admitted with Afib- and converted with dilt drip.  She continues to have problems with HTN.  She complains about pain and firmness in her left leg.  She has continued to gain weight He has a history of sleep apnea. She does not look like wearing her CPAP mask because it is uncomfortable at night.  Sept. 16, 2015:  Pt is doing well.  I have met her in the past.  She was seen by [name] in March for follow up for her a-fib.  Still eating salt.- diastolic BP is still elevated.  Unable to exercise due to leg pain  Nov. 24, 2015:  Mr. Birkland is seen back today for follow up of her HTN. We added Amlodipine at  her last visit.  She has not had any further episodes of atrial fib. She has lots of pain in her knees.  Is scheduled to see an orthopedist soon.  Feb. 1, 2017:  She has  Seen Dr. Caryl Comes since I last seen her. She has significant number of premature ventricular contractions. A 24-hour Holter revealed 11% PVC burden. These have greatly improved when she limited caffeine from her diet.  Signal average ECG was normal   Repeat echocardiogram reveals normal left ventricular systolic function. She is doing well.   No dyspnea, no CP  Her CPAP is not working.    She needs to have right knee replacement .     Sept. 6, 2017:  Doing well.  No further palpitations  - better since she is avoiding caffiene.  March 05, 2017:  Nimra is seen today. Feeling sluggish. Using her CPAP - fairly well 4 hrs at night   Dec. 17, 2019 Yanis is seen today for follow-up of her paroxysmal atrial fibrillation, hypertension, hyperlipidemia, PVC  She needs to have a colonoscopy and is here partly for preoperative evaluation prior to having her colonoscopy.  Remains in NSR  Wt is 263 lbs ,  Wt is up 6 lbs since I last saw her )  Exercises daily   Dec. 16, 2020 Kylena  is seen today for follow-up of her hypertension and paroxysmal atrial fibrillation.  Her weight today is 250 pounds which is down 13 pounds from last year.  She admits to eating a few more snacks than she should.   Wt Readings from Last 3 Encounters:  12/10/19 250 lb 6.4 oz (113.6 kg)  08/03/19 260 lb (117.9 kg)  12/26/18 262 lb (118.8 kg)     Past Medical History:  Diagnosis Date  . A-fib ([place])   . Acid reflux   . Arthritis   . Dyslipidemia   . Fatigue   . GERD (gastroesophageal reflux disease)   . History of hysterectomy   . Hx of echocardiogram    Echocardiogram (12/15): EF 55-60%, normal wall motion, PASP 32 mmHg  . Hyperlipidemia   . Hypertension   . Hypokalemia   . Obesity   . Personal history of hypertensive heart  disease   . Personal history of long-term (current) use of anticoagulants   . Shingles     Current Outpatient Medications  Medication Sig Dispense Refill  . acetaminophen (TYLENOL ARTHRITIS PAIN) 650 MG CR tablet Take 1,300 mg by mouth 2 (two) times daily as needed for pain.    Marland Kitchen amLODipine (NORVASC) 5 MG tablet Take 5 mg by mouth daily.    . B Complex-C (B-COMPLEX WITH VITAMIN C) tablet Take 1 tablet by mouth daily.    . Cholecalciferol (VITAMIN D) 2000 UNITS CAPS Take 2,000 Units by mouth every evening.    . Cinnamon 500 MG capsule Take 500 mg by mouth at bedtime.     . metoprolol succinate (TOPROL-XL) 100 MG 24 hr tablet Take 1 tablet by mouth at bedtime.   3  . rivaroxaban (XARELTO) 20 MG TABS tablet Take 1 tablet (20 mg total) by mouth daily with supper. 90 tablet 2  . rosuvastatin (CRESTOR) 10 MG tablet Take 10 mg by mouth at bedtime.      No current facility-administered medications for this visit.    Allergies:   Spironolactone and Lipitor [atorvastatin calcium]   Social History:  The patient  reports that she has never smoked. She has never used smokeless tobacco. She reports that she does not drink alcohol or use drugs.   Family History:  The patient's family history includes Coronary artery disease in an other family member; Heart failure in her cousin; Hypertension in her son and another family member.   ROS:  Please see the history of present illness.   She denies any bleeding problems. She has had some recent URI symptoms. She denies fever.   All other systems reviewed and negative.    Physical Exam: Blood pressure 132/68, pulse 69, height 5' 3" (1.6 m), weight 250 lb 6.4 oz (113.6 kg), SpO2 98 %. Body mass index is 44.36 kg/m.  GEN:  Middle age, moderately obese female, NAD  HEENT: Normal NECK: No JVD; No carotid bruits LYMPHATICS: No lymphadenopathy CARDIAC: RRR , no murmurs, rubs, gallops RESPIRATORY:  Clear to auscultation without rales, wheezing or rhonchi   ABDOMEN: Soft, non-tender, non-distended MUSCULOSKELETAL:  No edema; No deformity  SKIN: Warm and dry NEUROLOGIC:  Alert and oriented x 3   EKG:       ASSESSMENT AND PLAN:  1. Hypertension -   blood pressure seems to be well controlled.  Continue current medications.  We will get a basic metabolic profile and CBC in 6 months.  2. Hyperlipidemia -   Managed by primary   3. Paroxysmal atrial fibrillation - remains in NSR currently   4. Obesity -   Advised more exercise and better diet.  We will follow up with her in 1 year see  an APP.  5. Obstructive sleep apnea -  advised weight loss     [name], MD  12/10/2019 4:40 PM    [address] [place],  [place], [place]  [address] Pager [phone] Phone: [phone]; Fax: [phone]

## 2020-01-05 ENCOUNTER — Telehealth: Payer: Self-pay | Admitting: Student

## 2020-01-05 ENCOUNTER — Telehealth: Payer: Self-pay | Admitting: *Deleted

## 2020-01-05 MED ORDER — AMLODIPINE BESYLATE 5 MG PO TABS: 5.0000 mg | ORAL_TABLET | Freq: Every day | ORAL | 2 refills | Status: DC

## 2020-01-05 MED FILL — AMLODIPINE BESYLATE 5 MG TA: 5 | 30 days supply | Qty: 30 | Fill #0

## 2020-01-05 NOTE — Telephone Encounter (Signed)
Patient with diagnosis of afib on Xarelto for anticoagulation.    Procedure: ENDOSCOPY Date of procedure: 01/19/2020  CHADS2-VASc score of  2 (HTN,  female)  CrCl 95 ml/min  Per office protocol, patient can hold Xarelto for 1-2 days prior to procedure.

## 2020-01-05 NOTE — Telephone Encounter (Signed)
   Called patient today as part of pre-op protocol to discuss upcoming EGD (please see that telephone note for any pre-op information). While on the phone, patient mentioned that her BP has been very uncontrolled since she was taken off Amlodipine and started on HCTZ. However, I  reviewed her medication list in our system which still list Amlodipine 5mg  as a home medication and makes no mention of HCTZ. She states this medication changed happened a couple of months ago and states that her BP was well controlled when she was taking Amlodipine. Reviewed last office visit note from 12/10/2019. BP was well controlled at that time and no mention of stopping Amlodipine and starting HCTZ. Patient states it was her PCP who made this change and states it was because patient thought the Amlodipine was causing her heart to race more. She does not think this anymore and she does not like the HCTZ because of increased urinary frequency. She wants to go back to taking Amlodipine and stop the HCTZ. I think this is fine. Will send refill of Amlodipine 5mg  daily to pharmacy ([facility]). Have asked patient to continue to monitor BP at home and let us know if BP remains uncontrolled. Patient voiced understanding and agreed.   Will also route note to PCP so that they are aware.   Darreld Mclean, PA-C 01/05/2020 3:44 PM

## 2020-01-05 NOTE — Telephone Encounter (Signed)
Can you please comment on how long patient should hold Xarelto for upcoming endoscopy? Thank you!

## 2020-01-05 NOTE — Telephone Encounter (Signed)
   [facility] Pre-operative Risk Assessment    Request for surgical clearance:  What type of surgery is being performed? ENDOSCOPY 1. When is this surgery scheduled? 01/19/20   2. What type of clearance is required (medical clearance vs. Pharmacy clearance to hold med vs. Both)? BOTH  3. Are there any medications that need to be held prior to surgery and how long? XARELTO   4. Practice name and name of physician performing surgery? [facility]RAHMB[facility]   5. What is your office phone number [phone]    7.   What is your office fax number [phone]  8.   Anesthesia type (None, local, MAC, general) ? PROPOFOL   Devra Dopp 01/05/2020, 2:03 PM  _________________________________________________________________   (provider comments below)

## 2020-01-05 NOTE — Telephone Encounter (Signed)
   Primary Cardiologist: [name], MD  Chart reviewed as part of pre-operative protocol coverage. Patient recently seen by Dr. Acie Fredrickson on 12/10/2019 at which time she was doing well from a cardiac standpoint. Called patient today (01/05/2020) and patient states she has been doing well since last office visit. She notes occasional palpitations but states this is stable. No chest pain, significant shortness of breath, lightheadedness, dizziness, syncope, or CHF symptoms. She states she is able to climb a flight of steps but does not like to because of leg pain which she thinks is due to her weight (she weighs 250 lbs).  Given past medical history and time since last visit, based on ACC/AHA guidelines, Joni Columbia Barberena would be at acceptable risk for the planned procedure without further cardiovascular testing.   Per pharmacy and our office protocol, "patient can hold Xarelto for 1-2 days prior to procedure."  I will route this recommendation to the requesting party via [place] fax function and remove from pre-op pool.  Please call with questions.  Darreld Mclean, PA-C 01/05/2020, 3:30 PM

## 2020-01-13 ENCOUNTER — Other Ambulatory Visit: Payer: Self-pay | Admitting: Family Medicine

## 2020-01-13 DIAGNOSIS — N63 Unspecified lump in unspecified breast: Secondary | ICD-10-CM

## 2020-01-20 ENCOUNTER — Other Ambulatory Visit: Payer: Self-pay | Admitting: Gastroenterology

## 2020-01-20 ENCOUNTER — Ambulatory Visit
Admission: RE | Admit: 2020-01-20 | Discharge: 2020-01-20 | Disposition: A | Discharge status: Home / Self Care | Payer: Medicaid Other | Source: Ambulatory Visit | Attending: Gastroenterology | Admitting: Gastroenterology

## 2020-01-20 ENCOUNTER — Other Ambulatory Visit: Payer: Self-pay

## 2020-01-20 DIAGNOSIS — R1013 Epigastric pain: Secondary | ICD-10-CM

## 2020-01-20 DIAGNOSIS — M542 Cervicalgia: Secondary | ICD-10-CM

## 2020-01-26 ENCOUNTER — Telehealth: Payer: Self-pay | Admitting: Cardiovascular Disease

## 2020-01-26 NOTE — Telephone Encounter (Signed)
Spoke with patient who states she was put on HCTZ by her PCP and it did not control her BP well enough. She called and spoke with Sande Rives, [place] on 1/11 and was advised she could stop HCTZ and restart amlodipine 5 mg daily which she had taken previously. When she was seen by Dr. Acie Fredrickson on 12/16 BP was well-controlled on amlodipine 5 mg daily and Toprol XL 100 mg daily. At some point her medication was changed by her PCP.  She states now BP is currently running around 125/60 mmHg but she has noticed more palpitations and heart racing since restarting amlodipine.  States she walks almost everyday but on Saturday on her usual walk she had a lot of difficulty getting back up the hill. States it took a while for her heart rate and breathing to return to normal. She did not measure HR or BP. States she also felt some neck pain and heart beating in her ears. She has not walked for exercise since Saturday. States she still feels her heart racing. I scheduled her to see [name], PA tomorrow, 2/2. She verbalized understanding and agreement with plan and thanked me for the call.

## 2020-01-26 NOTE — Telephone Encounter (Signed)
Patient c/o Palpitations:  High priority if patient c/o lightheadedness, shortness of breath, or chest pain  1) How long have you had palpitations/irregular HR/ Afib? Are you having the symptoms now? Palpitations started  3 or 4 days really bad  2) Are you currently experiencing lightheadedness, SOB or CP? -day before- lightheaded and blurred vision- some chest pain and  Pain iin her neck- felt like her hear was beating in her head and ear 3)  4)  have a history of afib (atrial fibrillation) or irregular heart rhythm? yes  5) Have you checked your BP or HR? (document readings if available): *yes- it yesterday 125/60- have not taken it today  6) Are you experiencing any other symptoms? no

## 2020-01-27 ENCOUNTER — Other Ambulatory Visit: Payer: Self-pay

## 2020-01-27 ENCOUNTER — Ambulatory Visit: Payer: Medicaid Other | Admitting: Family Medicine

## 2020-01-27 ENCOUNTER — Encounter: Payer: Self-pay | Admitting: Family Medicine

## 2020-01-27 VITALS — BP 124/60 | HR 69 | Ht 63.0 in | Wt 250.0 lb

## 2020-01-27 DIAGNOSIS — E782 Mixed hyperlipidemia: Secondary | ICD-10-CM | POA: Diagnosis not present

## 2020-01-27 DIAGNOSIS — G4733 Obstructive sleep apnea (adult) (pediatric): Secondary | ICD-10-CM | POA: Diagnosis not present

## 2020-01-27 DIAGNOSIS — I10 Essential (primary) hypertension: Secondary | ICD-10-CM

## 2020-01-27 DIAGNOSIS — I480 Paroxysmal atrial fibrillation: Secondary | ICD-10-CM

## 2020-01-27 DIAGNOSIS — R002 Palpitations: Secondary | ICD-10-CM

## 2020-01-27 MED ORDER — RAMIPRIL 2.5 MG PO CAPS: 2.5000 mg | ORAL_CAPSULE | Freq: Every day | ORAL | 3 refills | Status: DC

## 2020-01-27 MED FILL — RAMIPRIL 2.5 MG CAPS: 2.5 | 90 days supply | Qty: 90 | Fill #0

## 2020-01-27 NOTE — Patient Instructions (Signed)
Medication Instructions:   Your physician has recommended you make the following change in your medication:   1) Stop Amlodipine  2) Start Ramipril 2.5 mg, 1 tablet by mouth once a day  *If you need a refill on your cardiac medications before your next appointment, please call your pharmacy*  Lab Work:  Your physician recommends that you return for lab work in 2 weeks on 02/10/20 at 9:30 AM  If you have labs (blood work) drawn today and your tests are completely normal, you will receive your results only by: Marland Kitchen MyChart Message (if you have MyChart) OR . A paper copy in the mail If you have any lab test that is abnormal or we need to change your treatment, we will call you to review the results.  Testing/Procedures:  None ordered today  Follow-Up: At [facility], you and your health needs are our priority.  As part of our continuing mission to provide you with exceptional heart care, we have created designated Provider Care Teams.  These Care Teams include your primary Cardiologist (physician) and Advanced Practice Providers (APPs -  Physician Assistants and Nurse Practitioners) who all work together to provide you with the care you need, when you need it.  Your next appointment:    On 02/24/20 at 8:45AM with [name], PA-C

## 2020-01-27 NOTE — Progress Notes (Signed)
Cardiology Office Note  Date: 01/27/2020   ID: Jacqueline Petersen 10-10-1956, MRN [id number]  PCP:  Jacqueline Prose, MD  Cardiologist:  [name], MD Electrophysiologist:  None   Chief Complaint  Patient presents with  . Palpitations    History of Present Illness: Jacqueline Petersen is a 64 y.o. female last encounter with Dr. Acie Fredrickson 12/10/2019.  History of hypertension, hyperlipidemia, PAF previously on Coumadin, GERD, obesity, obstructive sleep apnea, PVCs.  Previous left heart cath in 2008 with normal coronary arteries and EF of 50 to 55%.  Recent admission December 04, 2019 December 07, 2019 after complaints of palpitations.  She was discovered to be in atrial fibrillation with RVR.  He was converted with IV diltiazem back to normal sinus rhythm.  At that time it was determined she needed long-term anticoagulation.  CHA2DS2-VASc = 2.  Previous echo in 2014 showed moderate LVH and EF 50 to 55%.  No wall motion abnormalities.  At this office visit she complained of being okay since discharge other than being fatigued and having occasional palpitations.  This was close to baseline for her.  Patient recently had two presentations to the emergency room.  First presentation was for urinary issues.  She has a history of overactive bladder but had urinary dribbling and urgency after urinating.  Her second presentation 11/10/2019 was for exposure to Covid virus.  She was tested and the result was negative.  She had been exposed to her landlord who had the Covid virus and she was experiencing fatigue.  Patient was pending an EGD and called earlier in the month of January 05, 2020 for clearance for holding Xarelto for pending procedure.  Directions were given to hold Xarelto for 1 to 2 days prior to procedure.  She called the office yesterday January 26, 2020 complaining of palpitations starting 3 to 4 days ago and described them as really bad.  She described accompanying  lightheadedness, blurred vision and some chest pain as well as pain in her neck.  She described she could hear her heart beating in her ears.  She states she was recently placed on HCTZ by her PCP.  However it did not control her BP well.  She called cardiology PA  and was told she can stop HCTZ and restart amlodipine 5 mg which she had taken previously.  When she saw Dr. Katharina Caper last she was taking amlodipine 5 mg and Toprol-XL 100 mg daily.  She stated she noted she was having more palpitations and heart racing since she restarted the amlodipine.  Also describes increasing exertional fatigue and her heart racing.  Medications reviewed today and include; amlodipine 5 mg daily, Toprol-XL 100 mg daily, Xarelto 20 mg daily, Crestor 10 mg daily.  Past Medical History:  Diagnosis Date  . A-fib ([place])   . Acid reflux   . Arthritis   . Dyslipidemia   . Fatigue   . GERD (gastroesophageal reflux disease)   . History of hysterectomy   . Hx of echocardiogram    Echocardiogram (12/15): EF 55-60%, normal wall motion, PASP 32 mmHg  . Hyperlipidemia   . Hypertension   . Hypokalemia   . Obesity   . Personal history of hypertensive heart disease   . Personal history of long-term (current) use of anticoagulants   . Shingles     Past Surgical History:  Procedure Laterality Date  . ABDOMINAL HYSTERECTOMY    . CARDIAC CATHETERIZATION  04/15/2007   Est. EF of 50-55% --  Smooth and normal coronary arteries -- Normal left ventricular systolic function.  We will continue with further treatment of her atrial fibrillation     . PARTIAL HYSTERECTOMY      Current Outpatient Medications  Medication Sig Dispense Refill  . amLODipine (NORVASC) 5 MG tablet Take 1 tablet (5 mg total) by mouth daily. 30 tablet 2  . B Complex-C (B-COMPLEX WITH VITAMIN C) tablet Take 1 tablet by mouth daily.    . Cholecalciferol (VITAMIN D) 2000 UNITS CAPS Take 2,000 Units by mouth every evening.    . Cinnamon 500 MG capsule Take 500  mg by mouth at bedtime.     . metoprolol succinate (TOPROL-XL) 100 MG 24 hr tablet Take 1 tablet by mouth at bedtime.   3  . rivaroxaban (XARELTO) 20 MG TABS tablet Take 1 tablet (20 mg total) by mouth daily with supper. 90 tablet 2  . rosuvastatin (CRESTOR) 10 MG tablet Take 10 mg by mouth at bedtime.      No current facility-administered medications for this visit.   Allergies:  Lipitor [atorvastatin calcium] and Spironolactone   Social History: The patient  reports that she has never smoked. She has never used smokeless tobacco. She reports that she does not drink alcohol or use drugs.   Family History: The patient's family history includes Coronary artery disease in an other family member; Heart failure in her cousin; Hypertension in her son and another family member.   ROS:  Please see the history of present illness. Otherwise, complete review of systems is positive for none.  All other systems are reviewed and negative.   Physical Exam: VS:  BP 124/60   Pulse 69   Ht 5\' 3"  (1.6 m)   Wt 250 lb (113.4 kg)   SpO2 98%   BMI 44.29 kg/m , BMI Body mass index is 44.29 kg/m.  Wt Readings from Last 3 Encounters:  01/27/20 250 lb (113.4 kg)  12/10/19 250 lb 6.4 oz (113.6 kg)  08/03/19 260 lb (117.9 kg)    General: Patient appears comfortable at rest. Neck: Supple, no elevated JVP or carotid bruits, no thyromegaly. Lungs: Clear to auscultation, nonlabored breathing at rest. Cardiac: Regular rate and rhythm, no S3 or significant systolic murmur, no pericardial rub. Extremities: No pitting edema, distal pulses 2+. Skin: Warm and dry. Neuropsychiatric: Alert and oriented x3, affect grossly appropriate.  ECG:  An ECG dated January 27, 2020 was personally reviewed today and demonstrated:  Sinus rhythm 69  Recent Labwork: 09/10/2019: BUN 17; Creatinine, Ser 0.75; Hemoglobin 12.9; Platelets 334; Potassium 3.6; Sodium 139   Lipid panel November 2020 total cholesterol 162, triglycerides  78, HDL 3050, calculated LDL 96, non-HDL 112, cholesterol HDL ratio 3.2, LDL 96.    Component Value Date/Time   CHOL 186 12/02/2010 2013   TRIG 128 12/02/2010 2013   HDL 46 12/02/2010 2013   CHOLHDL 4.0 Ratio 12/02/2010 2013   VLDL 26 12/02/2010 2013   LDLCALC 114 (H) 12/02/2010 2013    Other Studies Reviewed Today:  Echocardiogram performed January 10, 2016 Study Conclusions   - Left ventricle: The cavity size was normal. Wall thickness was  normal. Systolic function was normal. The estimated ejection  fraction was in the range of 55% to 60%. Wall motion was normal;  there were no regional wall motion abnormalities.  - Left atrium: The atrium was moderately dilated.  - Atrial septum: No defect or patent foramen ovale was identified.   Sleep study performed February 11, 2018  IMPRESSIONS  - Severe obstructive sleep apnea occurred during the diagnostic  portion of the study (AHI = 30.9/hour). An optimal PAP pressure  was selected for this patient ( 10 cm of water)  - No significant central sleep apnea occurred during the  diagnostic portion of the study (CAI = 0.0/hour).  - Severe oxygen desaturation was noted during the diagnostic  portion of the study (Min O2 = 85.00%).  - The patient snored with Moderate snoring volume during the  diagnostic portion of the study.  - EKG findings include PVCs.  - Clinically significant periodic limb movements did not occur  during sleep.  DIAGNOSIS  - Obstructive Sleep Apnea   Assessment and Plan:  1. Essential hypertension   2. Paroxysmal atrial fibrillation (HCC)   3. Obstructive sleep apnea   4. Mixed hyperlipidemia   5. Palpitations    1. Essential hypertension Blood pressure today 124/60.  Patient states she is not tolerating amlodipine well.  States she started the amlodipine approximately 1 month ago and has recently started having palpitations and exertional fatigue.  Discontinue amlodipine.  Start ramipril 2.5 mg daily.   Get be BMET in 2 weeks.  2. Paroxysmal atrial fibrillation (HCC) History of paroxysmal atrial fibrillation.  Complaining of recent palpitations.  EKG today shows normal sinus rhythm with a rate of 69.  Patient believes starting amlodipine is contributing to the palpitations.   If palpitations continue in spite of discontinuing amlodipine we will order an event monitor.  Continue Toprol-XL 100 mg daily, continue Xarelto 20 mg daily.  3. Obstructive sleep apnea Patient has sleep apnea and uses CPAP approximately 4 hours per night.  She states sometimes the equipment falls off or she does not quite use it 4 hours per night.  Advised patient that non-compliance with CPAP therapy can cause increasing palpitations.  4.Mixed hyperlipidemia  Recent labs from primary care November 2020.  Lipid profile cholesterol 162, triglycerides 78, HDL be 50, calculated LDL 96, non-HDL 112.  Continue Crestor 10 mg daily.  5. Palpitations Patient states over the recent 3 to 4 days she has had palpitations and accompanying exertional fatigue sensation of heart beating in her ears.  States when she attempts to walk she becomes tired very easily and has to wait a while before heart rate returns to normal.  She is attributing the palpitations.  Recently starting amlodipine.  Medication Adjustments/Labs and Tests Ordered: Current medicines are reviewed at length with the patient today.  Concerns regarding medicines are outlined above.    There are no Patient Instructions on file for this visit.       Signed, Levell July, NP 01/27/2020 10:13 AM    [facility] [place], [place], [place] [address] Phone: [phone]; Fax: [phone]

## 2020-02-04 ENCOUNTER — Other Ambulatory Visit: Payer: Self-pay | Admitting: Family Medicine

## 2020-02-04 MED FILL — XARELTO 20 MG TABLET: 20 | 90 days supply | Qty: 90 | Fill #2

## 2020-02-10 ENCOUNTER — Other Ambulatory Visit: Payer: Medicaid Other

## 2020-02-10 ENCOUNTER — Other Ambulatory Visit: Payer: Self-pay

## 2020-02-10 DIAGNOSIS — I480 Paroxysmal atrial fibrillation: Secondary | ICD-10-CM

## 2020-02-10 DIAGNOSIS — R002 Palpitations: Secondary | ICD-10-CM

## 2020-02-10 DIAGNOSIS — I10 Essential (primary) hypertension: Secondary | ICD-10-CM

## 2020-02-10 LAB — BASIC METABOLIC PANEL
BUN/Creatinine Ratio: 12 (ref 12–28)
BUN: 9 mg/dL (ref 8–27)
CO2: 23 mmol/L (ref 20–29)
Calcium: 9 mg/dL (ref 8.7–10.3)
Chloride: 105 mmol/L (ref 96–106)
Creatinine, Ser: 0.74 mg/dL (ref 0.57–1.00)
GFR calc Af Amer: 100 mL/min/{1.73_m2} (ref >59)
GFR calc non Af Amer: 87 mL/min/{1.73_m2} (ref >59)
Glucose: 101 mg/dL — ABNORMAL HIGH (ref 65–99)
Potassium: 4 mmol/L (ref 3.5–5.2)
Sodium: 141 mmol/L (ref 134–144)

## 2020-02-13 ENCOUNTER — Other Ambulatory Visit: Payer: Medicaid Other

## 2020-02-16 MED FILL — METOPROLOL SUCCINATE ER 100: 100 | 90 days supply | Qty: 90 | Fill #1

## 2020-02-18 ENCOUNTER — Telehealth: Payer: Self-pay | Admitting: *Deleted

## 2020-02-18 NOTE — Telephone Encounter (Signed)
Patient informed. Copy sent to PCP °

## 2020-02-18 NOTE — Telephone Encounter (Signed)
-----   Message from Verta Ellen., NP sent at 02/17/2020  9:03 PM EST ----- Please call the patient and tell her that her lab work was normal. Thanks

## 2020-02-21 MED FILL — ROSUVASTATIN CALCIUM 10 MG: 10 | 90 days supply | Qty: 90 | Fill #1

## 2020-02-23 NOTE — Progress Notes (Signed)
Cardiology Office Note:    Date:  02/24/2020   ID:  Orvan July, DOB [birth year], MRN [id number]  PCP:  Donald Prose, MD  Cardiologist:  [name], MD   Electrophysiologist:  None   Referring MD: Donald Prose, MD   Chief Complaint:  Follow-up (Palpitations, HTN)    Patient Profile:    Jacqueline Petersen is a 64 y.o. female with:   Paroxysmal atrial fibrillation   CHA2DS2-VASc=2 (female, HTN) >> Warfarin >> Rivaroxaban  PVCs   11% per Holter in 2016  Eval by EP (Dr. Caryl Comes) >> EF normal, SAECG normal >> improved with reduced caffeine   Hypertension   Hyperlipidemia  Sleep apnea    Prior CV studies:   - Echocardiogram 01/10/16: EF 55-60, mod LAE  - SAECG 2/16: Normal   - Holter 2/16: NSR, Freq PVCs (11%)  - Echo (12/10/14):  EF 55-60%, normal wall motion, PASP 32 mmHg  - Echocardiogram (12/04/2013): Moderate LVH, EF 50-55%, normal wall motion.  - LHC (03/2007): Normal coronary arteries, EF 50-55%  History of Present Illness:    Jacqueline Petersen was last seen in clinic by Levell July, NP.  She complained of palpitations since starting on amlodipine.  Amlodipine was switched to Ramipril.    She returns for follow-up.  Since last seen, she has not had significant palpitations.  She has not had chest discomfort, shortness of breath, syncope, orthopnea or leg swelling.  She has been tolerating Ramipril.  Past Medical History:  Diagnosis Date  . A-fib ([place])   . Acid reflux   . Arthritis   . Dyslipidemia   . Fatigue   . GERD (gastroesophageal reflux disease)   . History of hysterectomy   . Hx of echocardiogram    Echocardiogram (12/15): EF 55-60%, normal wall motion, PASP 32 mmHg  . Hyperlipidemia   . Hypertension   . Hypokalemia   . Obesity   . Personal history of hypertensive heart disease   . Personal history of long-term (current) use of anticoagulants   . Shingles     Current Medications: Current Meds  Medication Sig  . B Complex-C  (B-COMPLEX WITH VITAMIN C) tablet Take 1 tablet by mouth daily.  . Cholecalciferol (VITAMIN D) 2000 UNITS CAPS Take 2,000 Units by mouth every evening.  . Cinnamon 500 MG capsule Take 500 mg by mouth at bedtime.   . metoprolol succinate (TOPROL-XL) 100 MG 24 hr tablet Take 1 tablet by mouth at bedtime.   . rivaroxaban (XARELTO) 20 MG TABS tablet Take 1 tablet (20 mg total) by mouth daily with supper.  . rosuvastatin (CRESTOR) 10 MG tablet Take 10 mg by mouth at bedtime.   . [DISCONTINUED] ramipril (ALTACE) 2.5 MG capsule Take 1 capsule (2.5 mg total) by mouth daily.     Allergies:   Lipitor [atorvastatin calcium] and Spironolactone   Social History   Tobacco Use  . Smoking status: Never Smoker  . Smokeless tobacco: Never Used  Substance Use Topics  . Alcohol use: No  . Drug use: No     Family Hx: The patient's family history includes Coronary artery disease in an other family member; Heart failure in her cousin; Hypertension in her son and another family member. There is no history of Heart attack or Stroke.  ROS   EKGs/Labs/Other Test Reviewed:    EKG:  EKG is not ordered today.  The ekg ordered today demonstrates n/a  Recent Labs: 09/10/2019: Hemoglobin 12.9; Platelets 334 02/10/2020: BUN 9; Creatinine, Ser 0.74;  Potassium 4.0; Sodium 141   Recent Lipid Panel Lab Results  Component Value Date/Time   CHOL 186 12/02/2010 08:13 PM   TRIG 128 12/02/2010 08:13 PM   HDL 46 12/02/2010 08:13 PM   CHOLHDL 4.0 Ratio 12/02/2010 08:13 PM   LDLCALC 114 (H) 12/02/2010 08:13 PM    Physical Exam:    VS:  BP 140/62   Pulse 84   Ht 5\' 3"  (1.6 m)   Wt 251 lb 6.4 oz (114 kg)   SpO2 98%   BMI 44.53 kg/m     Wt Readings from Last 3 Encounters:  02/24/20 251 lb 6.4 oz (114 kg)  01/27/20 250 lb (113.4 kg)  12/10/19 250 lb 6.4 oz (113.6 kg)     Constitutional:      Appearance: Healthy appearance. Not in distress.  Neck:     Thyroid: Thyroid normal.     Vascular: JVD normal.    Pulmonary:     Effort: Pulmonary effort is normal.     Breath sounds: No wheezing. No rales.  Cardiovascular:     Normal rate. Regular rhythm. Normal S1. Normal S2.     Murmurs: There is no murmur.  Edema:    Peripheral edema absent.  Abdominal:     Palpations: Abdomen is soft. There is no hepatomegaly.  Skin:    General: Skin is warm and dry.  Neurological:     Mental Status: Alert and oriented to person, place and time.     Cranial Nerves: Cranial nerves are intact.      ASSESSMENT & PLAN:    1. Essential hypertension Blood pressure remains above goal.  She does note that she has been eating more salt.  We discussed the importance of limiting salt as well as increasing activity and losing weight.  Continue current dose of metoprolol succinate.  I will increase her dose of Ramipril to 5 mg daily.  Arrange follow-up BMET in 2 weeks.  I have asked her to bring in a list of blood pressures for me to review at her lab visit in 2 weeks.  2. Palpitations 3. PVC's (premature ventricular contractions) 11% burden of PVCs in past.  Her workup was unremarkable and her symptoms improved with reduced caffeine at that time.  Currently, she has much less palpitations.  Continue beta-blocker.  If symptoms should recur/worsen, plan repeat cardiac monitor to assess PVC burden again.   4. Paroxysmal atrial fibrillation (HCC) She has maintained normal sinus rhythm.  She is tolerating anticoagulation.  Continue current dose of Rivaroxaban.     Dispo:  Return in about 6 months (around 08/26/2020) for Routine Follow Up, w/ Dr. Acie Fredrickson, or [name], PA-C, (virtual or in-person).   Medication Adjustments/Labs and Tests Ordered: Current medicines are reviewed at length with the patient today.  Concerns regarding medicines are outlined above.  Tests Ordered: Orders Placed This Encounter  Procedures  . Basic metabolic panel   Medication Changes: Meds ordered this encounter  Medications  . ramipril  (ALTACE) 5 MG capsule    Sig: Take 1 capsule (5 mg total) by mouth daily.    Dispense:  90 capsule    Refill:  3    Dose increase. Pt will call when ready to fill.    Order Specific Question:   Supervising Provider    Answer:   Lelon Perla [[id number]]    Signed, [name], PA-C  02/24/2020 9:21 AM    [facility] [place],  [place]  [address] Phone: [phone]; Fax: [phone]

## 2020-02-24 ENCOUNTER — Ambulatory Visit: Payer: Medicaid Other | Admitting: Physician Assistant

## 2020-02-24 ENCOUNTER — Other Ambulatory Visit: Payer: Self-pay

## 2020-02-24 ENCOUNTER — Encounter: Payer: Self-pay | Admitting: Physician Assistant

## 2020-02-24 VITALS — BP 140/62 | HR 84 | Ht 63.0 in | Wt 251.4 lb

## 2020-02-24 DIAGNOSIS — I493 Ventricular premature depolarization: Secondary | ICD-10-CM | POA: Diagnosis not present

## 2020-02-24 DIAGNOSIS — I10 Essential (primary) hypertension: Secondary | ICD-10-CM

## 2020-02-24 DIAGNOSIS — I480 Paroxysmal atrial fibrillation: Secondary | ICD-10-CM

## 2020-02-24 DIAGNOSIS — R002 Palpitations: Secondary | ICD-10-CM | POA: Diagnosis not present

## 2020-02-24 MED ORDER — RAMIPRIL 5 MG PO CAPS: 5.0000 mg | ORAL_CAPSULE | Freq: Every day | ORAL | 3 refills | Status: DC

## 2020-02-24 NOTE — Patient Instructions (Addendum)
Medication Instructions:   Your physician has recommended you make the following change in your medication:   1) Increase your Ramipril to 5 mg, 1 tablet by mouth once a day  *If you need a refill on your cardiac medications before your next appointment, please call your pharmacy*   Lab Work:  Your physician recommends that you return for lab work in: 2 weeks on 03/09/20, you do not need to be fasting and lab opens at 7:30AM.  If you have labs (blood work) drawn today and your tests are completely normal, you will receive your results only by: Marland Kitchen MyChart Message (if you have MyChart) OR . A paper copy in the mail If you have any lab test that is abnormal or we need to change your treatment, we will call you to review the results.   Testing/Procedures:  None ordered today  Follow-Up: At [facility], you and your health needs are our priority.  As part of our continuing mission to provide you with exceptional heart care, we have created designated Provider Care Teams.  These Care Teams include your primary Cardiologist (physician) and Advanced Practice Providers (APPs -  Physician Assistants and Nurse Practitioners) who all work together to provide you with the care you need, when you need it.  We recommend signing up for the patient portal called "MyChart".  Sign up information is provided on this After Visit Summary.  MyChart is used to connect with patients for Virtual Visits (Telemedicine).  Patients are able to view lab/test results, encounter notes, upcoming appointments, etc.  Non-urgent messages can be sent to your provider as well.   To learn more about what you can do with MyChart, go to [email or web address].    Your next appointment:   6 month(s)  The format for your next appointment:   Either In Person or Virtual  Provider:   You may see [name], MD or one of the following Advanced Practice Providers on your designated Care Team:    [name], PA-C  [place], [place]  Daune Perch, NP    Other Instructions  Check your blood pressure once a day for 2 weeks, bring those readings to your lab visit in 2 weeks for Scott to review.

## 2020-03-04 ENCOUNTER — Ambulatory Visit
Admission: RE | Admit: 2020-03-04 | Discharge: 2020-03-04 | Disposition: A | Discharge status: Home / Self Care | Payer: Medicaid Other | Source: Ambulatory Visit | Attending: Family Medicine | Admitting: Family Medicine

## 2020-03-04 ENCOUNTER — Other Ambulatory Visit: Payer: Self-pay

## 2020-03-04 DIAGNOSIS — N63 Unspecified lump in unspecified breast: Secondary | ICD-10-CM

## 2020-03-09 ENCOUNTER — Other Ambulatory Visit: Payer: Self-pay

## 2020-03-09 ENCOUNTER — Other Ambulatory Visit: Payer: Medicaid Other

## 2020-03-09 ENCOUNTER — Telehealth: Payer: Self-pay | Admitting: Physician Assistant

## 2020-03-09 DIAGNOSIS — I10 Essential (primary) hypertension: Secondary | ICD-10-CM

## 2020-03-09 LAB — BASIC METABOLIC PANEL
BUN/Creatinine Ratio: 15 (ref 12–28)
BUN: 12 mg/dL (ref 8–27)
CO2: 25 mmol/L (ref 20–29)
Calcium: 9.4 mg/dL (ref 8.7–10.3)
Chloride: 104 mmol/L (ref 96–106)
Creatinine, Ser: 0.78 mg/dL (ref 0.57–1.00)
GFR calc Af Amer: 94 mL/min/{1.73_m2} (ref >59)
GFR calc non Af Amer: 82 mL/min/{1.73_m2} (ref >59)
Glucose: 94 mg/dL (ref 65–99)
Potassium: 4 mmol/L (ref 3.5–5.2)
Sodium: 142 mmol/L (ref 134–144)

## 2020-03-09 NOTE — Telephone Encounter (Signed)
   Pt c/o medication issue:  1. Name of Medication:ramipril (ALTACE) 5 MG capsule  2. How are you currently taking this medication (dosage and times per day)?   3. Are you having a reaction (difficulty breathing--STAT)?   4. What is your medication issue? Pt said that after taking this med she started having a cough, she would like to know if she can get an alternative med  Please advise

## 2020-03-09 NOTE — Telephone Encounter (Signed)
DC Ramipril Start Losartan 50 mg once daily. BMET 2 weeks. [name], PA-C    03/09/2020 5:25 PM

## 2020-03-10 MED ORDER — LOSARTAN POTASSIUM 50 MG PO TABS: 50.0000 mg | ORAL_TABLET | Freq: Every day | ORAL | 6 refills | Status: DC

## 2020-03-10 MED FILL — LOSARTAN POTASSIUM 50 MG TA: 50 | 30 days supply | Qty: 30 | Fill #0

## 2020-03-10 NOTE — Telephone Encounter (Signed)
I called and spoke with patient, she is aware to stop Ramipril and start Losartan 50 mg, 1 tablet by mouth once a day. Prescription sent and BMET scheduled for 03/29/20.

## 2020-03-29 ENCOUNTER — Other Ambulatory Visit: Payer: Medicaid Other | Admitting: *Deleted

## 2020-03-29 ENCOUNTER — Other Ambulatory Visit: Payer: Self-pay

## 2020-03-29 DIAGNOSIS — I10 Essential (primary) hypertension: Secondary | ICD-10-CM

## 2020-03-29 LAB — BASIC METABOLIC PANEL
BUN/Creatinine Ratio: 18 (ref 12–28)
BUN: 12 mg/dL (ref 8–27)
CO2: 28 mmol/L (ref 20–29)
Calcium: 8.9 mg/dL (ref 8.7–10.3)
Chloride: 102 mmol/L (ref 96–106)
Creatinine, Ser: 0.67 mg/dL (ref 0.57–1.00)
GFR calc Af Amer: 109 mL/min/{1.73_m2} (ref >59)
GFR calc non Af Amer: 95 mL/min/{1.73_m2} (ref >59)
Glucose: 95 mg/dL (ref 65–99)
Potassium: 3.4 mmol/L — ABNORMAL LOW (ref 3.5–5.2)
Sodium: 141 mmol/L (ref 134–144)

## 2020-03-30 ENCOUNTER — Other Ambulatory Visit: Payer: Self-pay | Admitting: Physician Assistant

## 2020-03-30 ENCOUNTER — Telehealth: Payer: Self-pay | Admitting: Physician Assistant

## 2020-03-30 DIAGNOSIS — E876 Hypokalemia: Secondary | ICD-10-CM

## 2020-03-30 MED ORDER — POTASSIUM CHLORIDE CRYS ER 20 MEQ PO TBCR: 20.0000 meq | EXTENDED_RELEASE_TABLET | Freq: Every day | ORAL | 3 refills | Status: DC

## 2020-03-30 MED FILL — POTASSIUM CHLORIDE CRYS ER: 20 | 90 days supply | Qty: 90 | Fill #0

## 2020-03-30 NOTE — Telephone Encounter (Signed)
[name] T, PA-C  [place], [place]  Creatinine normal. K+ low.  PLAN:  - Start K+ 20 mEq once daily.  - BMET 1 week.  [name], PA-C   03/29/2020 7:51 PM    The patient has been notified of the result and verbalized understanding.  All questions (if any) were answered. Patient will start Potassium (RX sent) and come back on 04/07/20 for repeat BMET. Mady Haagensen, [place] 03/30/2020 12:20 PM

## 2020-03-30 NOTE — Telephone Encounter (Signed)
Patient returning call. Transferred to [place].

## 2020-04-02 MED FILL — LOSARTAN POTASSIUM 50 MG TA: 50 | 30 days supply | Qty: 30 | Fill #1

## 2020-04-07 ENCOUNTER — Other Ambulatory Visit: Payer: Medicaid Other | Admitting: *Deleted

## 2020-04-07 ENCOUNTER — Other Ambulatory Visit: Payer: Self-pay

## 2020-04-07 DIAGNOSIS — E876 Hypokalemia: Secondary | ICD-10-CM

## 2020-04-07 LAB — BASIC METABOLIC PANEL
BUN/Creatinine Ratio: 15 (ref 12–28)
BUN: 11 mg/dL (ref 8–27)
CO2: 26 mmol/L (ref 20–29)
Calcium: 9.3 mg/dL (ref 8.7–10.3)
Chloride: 104 mmol/L (ref 96–106)
Creatinine, Ser: 0.71 mg/dL (ref 0.57–1.00)
GFR calc Af Amer: 106 mL/min/{1.73_m2} (ref >59)
GFR calc non Af Amer: 92 mL/min/{1.73_m2} (ref >59)
Glucose: 91 mg/dL (ref 65–99)
Potassium: 3.7 mmol/L (ref 3.5–5.2)
Sodium: 141 mmol/L (ref 134–144)

## 2020-05-04 ENCOUNTER — Other Ambulatory Visit: Payer: Self-pay | Admitting: Cardiovascular Disease

## 2020-05-04 NOTE — Telephone Encounter (Signed)
Pt last saw [name], PA on 02/24/20, last labs 04/07/20 Creat 0.71, age 64, weight 114kg, CrCl 147.85, based on CrCl pt is on appropriate dosage of Xarelto 20mg  QD.  Will refill rx.

## 2020-05-05 ENCOUNTER — Telehealth: Payer: Self-pay | Admitting: Cardiovascular Disease

## 2020-05-05 DIAGNOSIS — Z79899 Other long term (current) drug therapy: Secondary | ICD-10-CM

## 2020-05-05 DIAGNOSIS — I10 Essential (primary) hypertension: Secondary | ICD-10-CM

## 2020-05-05 NOTE — Telephone Encounter (Signed)
DC Losartan. Start Spironolactone 25 mg once daily. Check BMET next week and another BMET the week after. Ask patient if her cough clearly improved off of Ramipril. [name], PA-C    05/05/2020 5:11 PM

## 2020-05-05 NOTE — Telephone Encounter (Signed)
I called and spoke with patient, since being on Losartan her blood pressure had been running high. The patient states that her blood pressures have been averaging 170-190/90-100. Patient is feeling weak, tired, feels like she can barely put one foot in front of the other. She has been trying to walk every day. Patient would like to change the Losartan. She was on Amlodipine and stated that worked well but gave her palpitations.

## 2020-05-05 NOTE — Telephone Encounter (Signed)
New message  Pt c/o medication issue:  1. Name of Medication:   losartan (COZAAR) 50 MG tablet     2. How are you currently taking this medication (dosage and times per day)? As written   3. Are you having a reaction (difficulty breathing--STAT)?no   4. What is your medication issue? Patient states that the b/p medication is not working her b/p is still elevated. Please call to discuss.

## 2020-05-06 MED ORDER — HYDRALAZINE HCL 25 MG PO TABS: 25.0000 mg | ORAL_TABLET | Freq: Three times a day (TID) | ORAL | 3 refills | Status: DC

## 2020-05-06 NOTE — Telephone Encounter (Signed)
Tired to call patient back, no answer and no voicemail to leave message.  Did not order spironolactone and added it back to her allergies. Will await for patient to call back to order hydralazine.

## 2020-05-06 NOTE — Telephone Encounter (Signed)
Called patient with [name] PA's recommendations. Patient stated her cough did improve after stopping Ramipril. Patient will come in on 5/20 and 5/27 for lab work. Patient verbalized understanding.   Undated patient's medications, when ordering spironolactone, it showed patient had an allergy to the medication. Called patient and asked her about the allergy. Patient stated she only has an allergy to lipitor that it made her face and tongue swell. Patient stated she does not remember ever taking spironolactone and she did not know what that medication was until today when I told her it is a diuretic. Informed patient that medication would be sent to her pharmacy, and if she has any reaction to stop taking medication right away and give our office a call. Patient agreed to plan. Will send message to [facility] PA, so he is aware.

## 2020-05-06 NOTE — Telephone Encounter (Signed)
I found a note in her chart in 2019 that stated she had facial and lip swelling after starting this medication. Do not take Spironolactone.  Cancel Rx.  Add to allergies. It looks like she has never taken Hydralazine. Start Hydralazine 25 mg three times a day. Follow up in HTN clinic in 3 weeks. [name], PA-C    05/06/2020 9:10 AM

## 2020-05-06 NOTE — Telephone Encounter (Signed)
Called patient back to let her know of plan change. Ordered Hydralazine 25 mg TID to patient's pharmacy of choice. Patient will see HTN clinic on 05/27/20.

## 2020-05-13 ENCOUNTER — Other Ambulatory Visit: Payer: Medicaid Other

## 2020-05-17 MED FILL — METOPROLOL SUCCINATE ER 100: 100 | 90 days supply | Qty: 90 | Fill #0

## 2020-05-20 ENCOUNTER — Other Ambulatory Visit: Payer: Medicaid Other

## 2020-05-26 MED FILL — ROSUVASTATIN CALCIUM 10 MG: 10 | 90 days supply | Qty: 90 | Fill #0

## 2020-05-27 ENCOUNTER — Other Ambulatory Visit: Payer: Self-pay

## 2020-05-27 ENCOUNTER — Other Ambulatory Visit: Payer: Medicaid Other | Admitting: *Deleted

## 2020-05-27 ENCOUNTER — Ambulatory Visit (INDEPENDENT_AMBULATORY_CARE_PROVIDER_SITE_OTHER): Payer: Medicaid Other | Admitting: Pharmacist

## 2020-05-27 ENCOUNTER — Encounter: Payer: Self-pay | Admitting: Pharmacist

## 2020-05-27 VITALS — BP 142/82 | HR 64

## 2020-05-27 DIAGNOSIS — E782 Mixed hyperlipidemia: Secondary | ICD-10-CM

## 2020-05-27 DIAGNOSIS — Z7901 Long term (current) use of anticoagulants: Secondary | ICD-10-CM

## 2020-05-27 DIAGNOSIS — I10 Essential (primary) hypertension: Secondary | ICD-10-CM | POA: Diagnosis not present

## 2020-05-27 DIAGNOSIS — I4819 Other persistent atrial fibrillation: Secondary | ICD-10-CM

## 2020-05-27 LAB — BASIC METABOLIC PANEL
BUN/Creatinine Ratio: 16 (ref 12–28)
BUN: 11 mg/dL (ref 8–27)
CO2: 24 mmol/L (ref 20–29)
Calcium: 9.4 mg/dL (ref 8.7–10.3)
Chloride: 102 mmol/L (ref 96–106)
Creatinine, Ser: 0.68 mg/dL (ref 0.57–1.00)
GFR calc Af Amer: 108 mL/min/{1.73_m2} (ref >59)
GFR calc non Af Amer: 94 mL/min/{1.73_m2} (ref >59)
Glucose: 103 mg/dL — ABNORMAL HIGH (ref 65–99)
Potassium: 4.1 mmol/L (ref 3.5–5.2)
Sodium: 141 mmol/L (ref 134–144)

## 2020-05-27 LAB — CBC
Hematocrit: 35.3 % (ref 34.0–46.6)
Hemoglobin: 11.5 g/dL (ref 11.1–15.9)
MCH: 24.9 pg — ABNORMAL LOW (ref 26.6–33.0)
MCHC: 32.6 g/dL (ref 31.5–35.7)
MCV: 77 fL — ABNORMAL LOW (ref 79–97)
Platelets: 319 10*3/uL (ref 150–450)
RBC: 4.61 x10E6/uL (ref 3.77–5.28)
RDW: 16.6 % — ABNORMAL HIGH (ref 11.7–15.4)
WBC: 6.6 10*3/uL (ref 3.4–10.8)

## 2020-05-27 MED ORDER — HYDROCHLOROTHIAZIDE 12.5 MG PO CAPS: 12.5000 mg | ORAL_CAPSULE | Freq: Every day | ORAL | 0 refills | Status: DC

## 2020-05-27 MED FILL — HYDROCHLOROTHIAZIDE 12.5 MG: 12.5 | 90 days supply | Qty: 90 | Fill #0

## 2020-05-27 NOTE — Patient Instructions (Addendum)
It was great meeting you today!  Continue exercising every day but try to increase walking to 30 minutes a day.  Continue healthy eating without adding salt.  Try to reduce junk foods like chips, cakes, and cookies.    Begin taking hydrochlorothiazide 12.5mg  every morning.  Continue taking your blood pressure in the morning and also start measuring it at night.  Keep your hydralazine at home and take a tablet (25mg  ) if your systolic blood pressure is greater than 150.  Do not take more than 3 tablets a day.  Continue your metoprolol 100mg  once daily

## 2020-05-27 NOTE — Progress Notes (Signed)
Patient ID: Jacqueline Petersen                 DOB: 08/24/1956                      MRN: [id number]     HPI: Jacqueline Petersen is a 64 y.o. female referred by [name] to HTN clinic. PMH is significant for A Fib, HTN, OSA, HLD, and obesity.  Hospitalized for Afib with RVR in December 2020 and converted to NSR on IV diltiazem.  BP was controlled well on Toprol XL and amlodipine 5mg , however reported palpitations and fatigue.  Amlodipine was discontinued and patient was switched to to ramipril, but this was discontinued due to cough.  Pt then switched to losartan 25 mg but patient reports was ineffective.  Currently only managed on hydralazine and metoprolol succinate.  Patient presents today in good spirits.  Had fallen asleep in lobby which patient believes is due to her sleep apnea.  Reports her biggest issue is avoiding junk food like cookies and cakes which she believes is negatively impacting her weight and blood pressure.  Is tired of pill burden, especially with potassium tablet which she reports is too big, and hydralazine, which she reports she sometimes forgets to take three times a day.  Believes she takes too many pills.  Felt that the amlodipine was effective but could not tolerate palpitations and was worried about being hospitalized.  Patient also unsure if she is truly allergic to spironolactone and believes her lip swelling may have been due to atorvastatin.  Current HTN meds: hydralazine 25 mg TID, metoprolol succinate 100 mg Previously tried: amlodipine 5 mg (palpitations), losartan 50 mg, ramipril 5 mg (cough), spironolactone 25mg  (facial and lip swelling), HCTZ (patient not sure why d/c) BP goal: <130/80   Social History: no tobacco, no alcohol  Diet: Pork, chicken, cabbage, broccoli.  Reports she loves vegetables.  Also eats chips, cookies, cakes cabbage,broccoli.  Chips, cookies, cakes  Exercise: walks about 20 minutes a day  Home BP readings:  Patient  does not have time and date set correctly on machine.  Also reports son sometimes uses machine and she does not use consistently.  Does not know which readings are her son's and which readings are her.    145/102, 133/69, 164/144, 139/94, 165/84, 164/89, 182/92, 144/72, 138/76, 145/78  Wt Readings from Last 3 Encounters:  02/24/20 251 lb 6.4 oz (114 kg)  01/27/20 250 lb (113.4 kg)  12/10/19 250 lb 6.4 oz (113.6 kg)   BP Readings from Last 3 Encounters:  02/24/20 140/62  01/27/20 124/60  12/10/19 132/68   Pulse Readings from Last 3 Encounters:  02/24/20 84  01/27/20 69  12/10/19 69    Renal function: CrCl cannot be calculated (Patient's most recent lab result is older than the maximum 21 days allowed.).  Past Medical History:  Diagnosis Date  . A-fib ([place])   . Acid reflux   . Arthritis   . Dyslipidemia   . Fatigue   . GERD (gastroesophageal reflux disease)   . History of hysterectomy   . Hx of echocardiogram    Echocardiogram (12/15): EF 55-60%, normal wall motion, PASP 32 mmHg  . Hyperlipidemia   . Hypertension   . Hypokalemia   . Obesity   . Personal history of hypertensive heart disease   . Personal history of long-term (current) use of anticoagulants   . Shingles     Current Outpatient Medications on File  Prior to Visit  Medication Sig Dispense Refill  . B Complex-C (B-COMPLEX WITH VITAMIN C) tablet Take 1 tablet by mouth daily.    . Cholecalciferol (VITAMIN D) 2000 UNITS CAPS Take 2,000 Units by mouth every evening.    . Cinnamon 500 MG capsule Take 500 mg by mouth at bedtime.     . hydrALAZINE (APRESOLINE) 25 MG tablet Take 1 tablet (25 mg total) by mouth 3 (three) times daily. 270 tablet 3  . metoprolol succinate (TOPROL-XL) 100 MG 24 hr tablet Take 1 tablet by mouth at bedtime.   3  . potassium chloride SA (KLOR-CON) 20 MEQ tablet Take 1 tablet (20 mEq total) by mouth daily. 90 tablet 3  . rosuvastatin (CRESTOR) 10 MG tablet Take 10 mg by mouth at bedtime.      Alveda Reasons 20 MG TABS tablet TAKE 1 TABLET (20 MG TOTAL) BY MOUTH DAILY WITH SUPPER. 90 tablet 2   No current facility-administered medications on file prior to visit.    Allergies  Allergen Reactions  . Lipitor [Atorvastatin Calcium] Swelling  . Spironolactone Swelling    Facial and lip swelling/denies respiratory distress     Assessment/Plan:  1. Hypertension - Patient blood pressure today 142/82 which is above goal of <130/80.  Reviewing blood pressure readings from home monitor and majority of readings are above goal, however it appears patient is checking infrequently and it is unclear which readings are hers and which are her sons.  Instructed patient to check BP every morning and also at night.  Patient is willing to try HCTZ again because she believed it worked well.  Also willing to try spironolactone again because she does not believe she is truly allergic, but risk likely outweighs benefit at this time.  Advised patient to reduce amount of junk/snack foods she eats and counseled her to not purchase them from the store if she can not help herself from eating them.  Educated patient on link between OSA and HTN and recommended CPAP compliance and provided handouts.  Recommended patient increase exercise from 20 minutes a day to 30 minutes a day.  Since patient does not like taking hydralazine three times daily, instructed patient to take HCTZ in morning, check BP one hour later, and then take hydralazine if systolic BP is greater than 150.  Will recheck BMP in 1 month.  Patient voiced understanding.  Karren Cobble, PharmD, BCACP, [place] [id number], [place], [place] [address] Phone: [phone]; Fax: [phone] 05/27/2020 9:43 AM

## 2020-06-04 ENCOUNTER — Other Ambulatory Visit (HOSPITAL_COMMUNITY): Payer: Self-pay | Admitting: Family Medicine

## 2020-06-09 ENCOUNTER — Other Ambulatory Visit: Payer: Medicaid Other

## 2020-06-23 MED FILL — POTASSIUM CHLORIDE CRYS ER: 20 | 90 days supply | Qty: 90 | Fill #1

## 2020-07-28 MED FILL — XARELTO 20 MG TABLET: 20 | 90 days supply | Qty: 90 | Fill #1

## 2020-08-11 ENCOUNTER — Other Ambulatory Visit: Payer: Self-pay

## 2020-08-11 ENCOUNTER — Encounter (HOSPITAL_COMMUNITY): Payer: Self-pay | Admitting: *Deleted

## 2020-08-11 ENCOUNTER — Emergency Department (HOSPITAL_COMMUNITY)
Admission: EM | Admit: 2020-08-11 | Discharge: 2020-08-12 | Disposition: A | Discharge status: Home / Self Care | Payer: Medicaid Other | Attending: Emergency Medicine | Admitting: Emergency Medicine

## 2020-08-11 DIAGNOSIS — Z7901 Long term (current) use of anticoagulants: Secondary | ICD-10-CM | POA: Diagnosis not present

## 2020-08-11 DIAGNOSIS — R319 Hematuria, unspecified: Secondary | ICD-10-CM | POA: Insufficient documentation

## 2020-08-11 DIAGNOSIS — R300 Dysuria: Secondary | ICD-10-CM | POA: Diagnosis present

## 2020-08-11 DIAGNOSIS — I119 Hypertensive heart disease without heart failure: Secondary | ICD-10-CM | POA: Insufficient documentation

## 2020-08-11 DIAGNOSIS — Z79899 Other long term (current) drug therapy: Secondary | ICD-10-CM | POA: Diagnosis not present

## 2020-08-11 LAB — BASIC METABOLIC PANEL
Anion gap: 8 (ref 5–15)
BUN: 9 mg/dL (ref 8–23)
CO2: 29 mmol/L (ref 22–32)
Calcium: 9.3 mg/dL (ref 8.9–10.3)
Chloride: 100 mmol/L (ref 98–111)
Creatinine, Ser: 0.91 mg/dL (ref 0.44–1.00)
GFR calc Af Amer: >60 mL/min (ref >60)
GFR calc non Af Amer: >60 mL/min (ref >60)
Glucose, Bld: 101 mg/dL — ABNORMAL HIGH (ref 70–99)
Potassium: 3.3 mmol/L — ABNORMAL LOW (ref 3.5–5.1)
Sodium: 137 mmol/L (ref 135–145)

## 2020-08-11 LAB — CBC
HCT: 41.4 % (ref 36.0–46.0)
Hemoglobin: 12.7 g/dL (ref 12.0–15.0)
MCH: 26.5 pg (ref 26.0–34.0)
MCHC: 30.7 g/dL (ref 30.0–36.0)
MCV: 86.3 fL (ref 80.0–100.0)
Platelets: 344 10*3/uL (ref 150–400)
RBC: 4.8 MIL/uL (ref 3.87–5.11)
RDW: 15.3 % (ref 11.5–15.5)
WBC: 10.3 10*3/uL (ref 4.0–10.5)
nRBC: 0 % (ref 0.0–0.2)

## 2020-08-11 LAB — URINALYSIS, ROUTINE W REFLEX MICROSCOPIC
Bacteria, UA: NONE SEEN
Bilirubin Urine: NEGATIVE
Glucose, UA: NEGATIVE mg/dL
Ketones, ur: NEGATIVE mg/dL
Leukocytes,Ua: NEGATIVE
Nitrite: NEGATIVE
Protein, ur: NEGATIVE mg/dL
Specific Gravity, Urine: 1.004 — ABNORMAL LOW (ref 1.005–1.030)
pH: 8 (ref 5.0–8.0)

## 2020-08-11 MED FILL — SULFAMETHOXAZOLE-TMP DS TAB: 800-160 | 5 days supply | Qty: 10 | Fill #0

## 2020-08-11 NOTE — ED Triage Notes (Signed)
Pt is here for left flank pain for 3 days and today she noted hematuria.

## 2020-08-12 ENCOUNTER — Emergency Department (HOSPITAL_COMMUNITY): Payer: Medicaid Other

## 2020-08-12 NOTE — ED Notes (Signed)
Pt stated understanding of discharge instructions. Refused wheelchair to waiting room.

## 2020-08-12 NOTE — ED Provider Notes (Signed)
[facility] EMERGENCY DEPARTMENT Provider Note   CSN: [id number] Arrival date & time: 08/11/20  1739     History Chief Complaint  Patient presents with  . Flank Pain  . Hematuria    Jacqueline Petersen is a 64 y.o. female.  HPI 64 year old female with history of A. fib, arthritis, GERD, hyperlipidemia, hypertension, obesity presents to the ER with a 1 day history of dysuria, blood in her urine, and left flank pain.  Patient states that she saw her PCP yesterday for the symptoms and was thought to have a UTI.  She was started on Bactrim, but states she was only able to take 1 pill.  After she took 1 pill of Bactrim, she started to have significant left flank pain.  She states that her-year-old started out being oriented, but has been bright red over the last 6 hours.  She endorses some burning with urination as well.  No history of kidney stones.  Denies any vaginal bleeding.  No significant abdominal pain.  Denies any history of kidney stones.  She has not taken anything for her symptoms.  Denies any fevers or chills.  No back pain, nausea, vomiting, chest pain or shortness of breath.  She states that she was recently started on Eliquis secondary to the pandemic.  Previously was on Coumadin.    Past Medical History:  Diagnosis Date  . A-fib ([place])   . Acid reflux   . Arthritis   . Dyslipidemia   . Fatigue   . GERD (gastroesophageal reflux disease)   . History of hysterectomy   . Hx of echocardiogram    Echocardiogram (12/15): EF 55-60%, normal wall motion, PASP 32 mmHg  . Hyperlipidemia   . Hypertension   . Hypokalemia   . Obesity   . Personal history of hypertensive heart disease   . Personal history of long-term (current) use of anticoagulants   . Shingles     Patient Active Problem List   Diagnosis Date Noted  . Obstructive sleep apnea 07/17/2017  . Morbid obesity due to excess calories ([place]) 07/17/2017  . PVC's (premature ventricular  contractions) 01/26/2016  . Long term (current) use of anticoagulants 12/09/2013  . Atrial fibrillation with RVR ([place]) 12/03/2013  . Hyperlipidemia 06/12/2007  . Essential hypertension 06/12/2007  . ATRIAL FIBRILLATION 06/12/2007  . GERD 06/12/2007    Past Surgical History:  Procedure Laterality Date  . ABDOMINAL HYSTERECTOMY    . CARDIAC CATHETERIZATION  04/15/2007   Est. EF of 50-55% -- Smooth and normal coronary arteries -- Normal left ventricular systolic function.  We will continue with further treatment of her atrial fibrillation     . PARTIAL HYSTERECTOMY       OB History   No obstetric history on file.     Family History  Problem Relation Age of Onset  . Coronary artery disease Other   . Hypertension Other   . Heart failure Cousin   . Hypertension Son   . Heart attack Neg Hx   . Stroke Neg Hx     Social History   Tobacco Use  . Smoking status: Never Smoker  . Smokeless tobacco: Never Used  Substance Use Topics  . Alcohol use: No  . Drug use: No    Home Medications Prior to Admission medications   Medication Sig Start Date End Date Taking? Authorizing Provider  Cholecalciferol (VITAMIN D) 2000 UNITS CAPS Take 2,000 Units by mouth every evening.    [provider]  hydrALAZINE (  APRESOLINE) 25 MG tablet Take 1 tablet (25 mg total) by mouth 3 (three) times daily. 05/06/20   [name] T, PA-C  hydrochlorothiazide (MICROZIDE) 12.5 MG capsule Take 1 capsule (12.5 mg total) by mouth daily. 05/27/20 08/25/20  Nahser, Wonda Cheng, MD  metoprolol succinate (TOPROL-XL) 100 MG 24 hr tablet Take 1 tablet by mouth at bedtime.  07/11/17   [provider]  potassium chloride SA (KLOR-CON) 20 MEQ tablet Take 1 tablet (20 mEq total) by mouth daily. 03/30/20   [name] T, PA-C  rosuvastatin (CRESTOR) 10 MG tablet Take 10 mg by mouth at bedtime.     [provider]  XARELTO 20 MG TABS tablet TAKE 1 TABLET (20 MG TOTAL) BY MOUTH DAILY WITH SUPPER. 05/04/20    Nahser, Wonda Cheng, MD    Allergies    Lipitor [atorvastatin calcium] and Spironolactone  Review of Systems   Review of Systems  Constitutional: Negative for chills and fever.  HENT: Negative for ear pain and sore throat.   Eyes: Negative for pain and visual disturbance.  Respiratory: Negative for cough and shortness of breath.   Cardiovascular: Negative for chest pain and palpitations.  Gastrointestinal: Negative for abdominal pain and vomiting.  Genitourinary: Positive for dysuria, flank pain and hematuria. Negative for pelvic pain, vaginal bleeding and vaginal discharge.  Musculoskeletal: Negative for arthralgias and back pain.  Skin: Negative for color change and rash.  Neurological: Negative for seizures, syncope and headaches.  Psychiatric/Behavioral: Negative for confusion.  All other systems reviewed and are negative.   Physical Exam Updated Vital Signs BP 136/85 (BP Location: Right Arm)   Pulse 73   Temp 98.4 F (36.9 C) (Oral)   Resp 16   Ht 5\' 5"  (1.651 m)   Wt 112 kg   SpO2 100%   BMI 41.10 kg/m   Physical Exam Vitals and nursing note reviewed.  Constitutional:      General: She is not in acute distress.    Appearance: She is well-developed. She is obese. She is not ill-appearing or diaphoretic.  HENT:     Head: Normocephalic and atraumatic.     Nose: Nose normal.     Mouth/Throat:     Mouth: Mucous membranes are moist.     Pharynx: Oropharynx is clear.  Eyes:     Conjunctiva/sclera: Conjunctivae normal.     Pupils: Pupils are equal, round, and reactive to light.  Cardiovascular:     Rate and Rhythm: Normal rate and regular rhythm.     Heart sounds: No murmur heard.   Pulmonary:     Effort: Pulmonary effort is normal. No respiratory distress.     Breath sounds: Normal breath sounds.  Abdominal:     General: Abdomen is flat.     Palpations: Abdomen is soft.     Tenderness: There is no abdominal tenderness. There is no right CVA tenderness or left  CVA tenderness.  Musculoskeletal:        General: No tenderness or deformity. Normal range of motion.     Cervical back: Neck supple.  Skin:    General: Skin is warm and dry.     Findings: No bruising or erythema.  Neurological:     General: No focal deficit present.     Mental Status: She is alert.  Psychiatric:        Mood and Affect: Mood normal.        Behavior: Behavior normal.     ED Results / Procedures / Treatments  Labs (all labs ordered are listed, but only abnormal results are displayed) Labs Reviewed  URINALYSIS, ROUTINE W REFLEX MICROSCOPIC - Abnormal; Notable for the following components:      Result Value   Color, Urine COLORLESS (*)    Specific Gravity, Urine 1.004 (*)    Hgb urine dipstick MODERATE (*)    All other components within normal limits  BASIC METABOLIC PANEL - Abnormal; Notable for the following components:   Potassium 3.3 (*)    Glucose, Bld 101 (*)    All other components within normal limits  URINE CULTURE  CBC    EKG None  Radiology CT Renal Stone Study  Result Date: 08/12/2020 CLINICAL DATA:  Left flank pain, hematuria EXAM: CT ABDOMEN AND PELVIS WITHOUT CONTRAST TECHNIQUE: Multidetector CT imaging of the abdomen and pelvis was performed following the standard protocol without IV contrast. COMPARISON:  07/07/2011 FINDINGS: Lower chest: No acute abnormality. Hepatobiliary: No focal liver abnormality is seen. No gallstones, gallbladder wall thickening, or biliary dilatation. Pancreas: Unremarkable. No pancreatic ductal dilatation or surrounding inflammatory changes. Spleen: Normal in size without focal abnormality. Adrenals/Urinary Tract: Unremarkable adrenal glands. Unremarkable noncontrast appearance of the bilateral kidneys. No focal lesion, stone, or hydronephrosis. Bilateral ureters are nondilated. No ureteral calculi. Urinary bladder appears within normal limits, although evaluation is limited by under distension. Stomach/Bowel: Stomach is  within normal limits. Appendix appears normal. Scattered colonic diverticulosis. No evidence of bowel wall thickening, distention, or inflammatory changes. Vascular/Lymphatic: Scattered atherosclerotic calcifications of the bilateral iliac arteries. No aneurysm. No abdominopelvic lymphadenopathy. Reproductive: Status post hysterectomy. No adnexal masses. Other: No free fluid. No abdominopelvic fluid collection. No pneumoperitoneum. Musculoskeletal: No acute or significant osseous findings. IMPRESSION: 1. No acute abdominopelvic findings. Specifically, no evidence of obstructive uropathy or findings to explain patient's hematuria. 2. Scattered colonic diverticulosis without evidence of acute diverticulitis. Electronically Signed   By: Davina Poke D.O.   On: 08/12/2020 14:39    Procedures Procedures (including critical care time)  Medications Ordered in ED Medications - No data to display  ED Course  I have reviewed the triage vital signs and the nursing notes.  Pertinent labs & imaging results that were available during my care of the patient were reviewed by me and considered in my medical decision making (see chart for details).    MDM Rules/Calculators/A&P                         56:70 PM: 64 year old female with 1 day history of left flank pain, hematuria On presentation, she is alert, oriented, doxepin, no acute distress, very pleasant.  Vital signs are overall reassuring.  Physical exam without any flank tenderness on my exam, she states that she is currently not in any pain.  Abdomen is soft and nontender.  DDx includes kidney stones, UTI, malignancy    I personally reviewed and interpreted her lab work CBC without leukocytosis, normal hemoglobin BMP without any significant electrolyte abnormalities, a mild hypokalemia 3.3 was noted.  Normal renal function.  Normal anion gap. UA with a specific gravity 1.004 and moderate hemoglobin.  No clear evidence of UTI  Despite the patient  not having any flank pain on exam and her UA not being very suggestive of UTI, will order CT renal study to rule out kidney stones.  3:15 PM: CT scan with no acute abdominopelvic findings, negative for stones.  Diverticulosis without diverticulitis was noted.  Given that the patient is not having any evidence of  UTI on UA, discussed stopping the Bactrim.  Patient will be referred to urology for further follow-up.  Encouraged Tylenol for pain.  DISPOSITION: Stable for d/c to home with urology follow-up and strict return precautions discussed with the patient.  She voices understanding is agreeable.  At this stage in ED course, patient has been  medically screened and is stable for discharge.  Final Clinical Impression(s) / ED Diagnoses Final diagnoses:  Hematuria, unspecified type    Rx / DC Orders ED Discharge Orders    None       Lyndel Safe 08/12/20 1521    Davonna Belling, MD 08/12/20 1555

## 2020-08-12 NOTE — Discharge Instructions (Addendum)
Today did not show any evidence of kidney stones. Your urine did not show evidence of a UTI either. Please stop taking the antibiotic prescribed by your doctor.  Please make sure to take Tylenol if you continue to have pain.  Return to the ER if you have any new or worsening symptoms.  Please make sure to follow-up with Dr. Junious Silk with urology for your symptoms that you are experiencing today.  Please call the phone number to schedule an appointment.

## 2020-08-12 NOTE — ED Notes (Signed)
Pt transported to CT ?

## 2020-08-13 ENCOUNTER — Telehealth: Payer: Self-pay | Admitting: Cardiovascular Disease

## 2020-08-13 LAB — URINE CULTURE: Culture: NO GROWTH

## 2020-08-13 MED ORDER — APIXABAN 5 MG PO TABS: 5.0000 mg | ORAL_TABLET | Freq: Two times a day (BID) | ORAL | 1 refills | Status: DC

## 2020-08-13 MED FILL — ELIQUIS 5 MG TABLET: 5 | 30 days supply | Qty: 60 | Fill #0

## 2020-08-13 NOTE — Telephone Encounter (Signed)
Called pt to discuss. She states she has been noticing straight blood in her urine. She called her PCP who completed a urine sample. Thought she may have an infection and called in antibiotics for pt. She went to ED for pain after single dose of Bactrim and had x-rays done in ED who told her she didn't have gallstones or UTI. Advised her to stop antibiotic and referred her to urologist. Bleeding has since stopped.  Discussed that Eliquis is associated with lowest rate of bleeding compared to other DOACs or warfarin. Will stop Xarelto and change to Eliquis 5mg  BID. Pt has Medicaid insurance which covers DOACs well. Pt is in agreement with tx plan and will keep follow up with urology to evaluate for other sources of bleeding.

## 2020-08-13 NOTE — Telephone Encounter (Signed)
Patient is wanting to know if she can switch from Xarelto to another blood thinner to how bad she has been bleeding with Xarelto. Please call.

## 2020-08-14 MED FILL — METOPROLOL SUCCINATE ER 100: 100 | 90 days supply | Qty: 90 | Fill #1

## 2020-08-15 NOTE — Telephone Encounter (Signed)
  Agree with the plan as outlined by Fuller Canada, RPH .

## 2020-08-17 MED FILL — ROSUVASTATIN CALCIUM 10 MG: 10 | 90 days supply | Qty: 90 | Fill #1

## 2020-08-24 ENCOUNTER — Other Ambulatory Visit: Payer: Self-pay | Admitting: Cardiovascular Disease

## 2020-08-24 DIAGNOSIS — I10 Essential (primary) hypertension: Secondary | ICD-10-CM

## 2020-08-24 MED FILL — HYDROCHLOROTHIAZIDE 12.5 MG: 12.5 | 90 days supply | Qty: 90 | Fill #0

## 2020-08-24 MED FILL — FERREX 150 CAPSULE: 150 | 90 days supply | Qty: 90 | Fill #1

## 2020-09-01 ENCOUNTER — Telehealth: Payer: Self-pay | Admitting: Cardiovascular Disease

## 2020-09-01 DIAGNOSIS — I480 Paroxysmal atrial fibrillation: Secondary | ICD-10-CM

## 2020-09-01 NOTE — Telephone Encounter (Signed)
Spoke with the patient who states that she is having a really hard time remembering to take Eliquis twice per day. She has recently only been taking it once per day. She is wondering if there is another medication that she could switch to that was only once per day. I advised her that the reason for switching to Eliquis was to reduce her risk of bleeding. She was previously on xarelto and had some bleeding issues.  Strongly encouraged patient to resume Eliquis twice per day and suggested setting reminders for when to take it. Will also route to Dr. Acie Fredrickson and PharmD for any further recommendations.

## 2020-09-01 NOTE — Telephone Encounter (Signed)
Pt c/o medication issue:  1. Name of Medication: apixaban (ELIQUIS) 5 MG TABS tablet  2. How are you currently taking this medication (dosage and times per day)? 1 tablet twice daily  3. Are you having a reaction (difficulty breathing--STAT)? no  4. What is your medication issue? Patient states she can't remember to take the medication twice a day. She states she has been only taking it once a day. She would like to know if she can have a medication to only take once a day.

## 2020-09-01 NOTE — Telephone Encounter (Signed)
The only once daily DOACS are Xarelto and Savaysa 60 mg a day . She couild check with her pharmacist to see if she can afford Savaysa 60 mg a day  Otherwise, I agree with having her set alarms to remind her to take the Eliquis BID

## 2020-09-02 MED ORDER — EDOXABAN TOSYLATE 60 MG PO TABS: 60.0000 mg | ORAL_TABLET | Freq: Every day | ORAL | 1 refills | Status: DC

## 2020-09-02 NOTE — Telephone Encounter (Signed)
I spoke with patient and gave her information from Dr Acie Fredrickson.  She will check with her insurance and then let us know if [facility] is covered.

## 2020-09-02 NOTE — Telephone Encounter (Signed)
Please start Savasya 60 mg a day  Please get BMP and CBC in 1 month

## 2020-09-02 NOTE — Telephone Encounter (Signed)
Patient calling back.   °

## 2020-09-02 NOTE — Telephone Encounter (Signed)
Called and spoke to patient and made her aware of recommendations from Dr. Acie Fredrickson to D/C eliquis and start Savaysa 60 mg QD. Patient verbalized understanding and will come in for CBC and BMET on 10/11.

## 2020-09-02 NOTE — Telephone Encounter (Signed)
Spoke with pt who states she would like for Dr Acie Fredrickson to send Rx for [facility] 60mg  as she has spoken with pharmacy and they need Rx in order to quote her a price.  Encouraged pt to contact her insurance company to check on coverage and she states she does not know who her current insurance company is or how to contact them so she would prefer Rx to be sent to pharmacy on file. Will forward request to Dr Acie Fredrickson.  Pt verbalizes understanding and agrees with current plan.

## 2020-09-02 NOTE — Telephone Encounter (Signed)
I agree with Dr. Acie Fredrickson. Eliquis is the better medication. She should try to associate taking her second dose with an activity she does everyday. Like a meal, or bedtime routine. Setting an alarm is also a good idea.

## 2020-09-02 NOTE — Addendum Note (Signed)
Addended by: Drue Novel I on: 09/02/2020 02:54 PM   Modules accepted: Orders

## 2020-09-07 ENCOUNTER — Telehealth: Payer: Self-pay

## 2020-09-07 NOTE — Telephone Encounter (Signed)
**Note De-Identified Jacqueline Petersen Obfuscation** I started a Savaysa PA through covermymeds: Key: [id number]

## 2020-09-08 MED FILL — SAVAYSA 60 MG TABLET: 60 | 90 days supply | Qty: 90 | Fill #0

## 2020-09-08 NOTE — Telephone Encounter (Signed)
**Note De-Identified Shedrick Sarli Obfuscation** Following message received from covermymeds:  Salvadore Farber Key: [id number] - PA Case ID: [id number]   Outcome Approved on September 14  Request Reference Number: [id number].  SAVAYSA TAB 60MG  is approved through 09/07/2021.   [employer] Electronic Prior Authorization Form (2017 [place]CPDP)   I have notified [facility] of this approval.

## 2020-10-01 MED FILL — POTASSIUM CHLORIDE CRYS ER: 20 | 90 days supply | Qty: 90 | Fill #2

## 2020-10-04 ENCOUNTER — Other Ambulatory Visit: Payer: Self-pay

## 2020-10-04 ENCOUNTER — Other Ambulatory Visit: Payer: Medicaid Other

## 2020-10-04 DIAGNOSIS — I480 Paroxysmal atrial fibrillation: Secondary | ICD-10-CM

## 2020-10-04 LAB — BASIC METABOLIC PANEL
BUN/Creatinine Ratio: 23 (ref 12–28)
BUN: 14 mg/dL (ref 8–27)
CO2: 29 mmol/L (ref 20–29)
Calcium: 9.6 mg/dL (ref 8.7–10.3)
Chloride: 101 mmol/L (ref 96–106)
Creatinine, Ser: 0.6 mg/dL (ref 0.57–1.00)
GFR calc Af Amer: 113 mL/min/{1.73_m2} (ref >59)
GFR calc non Af Amer: 98 mL/min/{1.73_m2} (ref >59)
Glucose: 93 mg/dL (ref 65–99)
Potassium: 3.7 mmol/L (ref 3.5–5.2)
Sodium: 142 mmol/L (ref 134–144)

## 2020-10-04 LAB — CBC
Hematocrit: 37.2 % (ref 34.0–46.6)
Hemoglobin: 12.6 g/dL (ref 11.1–15.9)
MCH: 28 pg (ref 26.6–33.0)
MCHC: 33.9 g/dL (ref 31.5–35.7)
MCV: 83 fL (ref 79–97)
Platelets: 271 10*3/uL (ref 150–450)
RBC: 4.5 x10E6/uL (ref 3.77–5.28)
RDW: 13.6 % (ref 11.7–15.4)
WBC: 6.7 10*3/uL (ref 3.4–10.8)

## 2020-11-23 ENCOUNTER — Encounter: Payer: Self-pay | Admitting: Cardiovascular Disease

## 2020-11-23 ENCOUNTER — Other Ambulatory Visit: Payer: Self-pay

## 2020-11-23 ENCOUNTER — Ambulatory Visit (INDEPENDENT_AMBULATORY_CARE_PROVIDER_SITE_OTHER): Payer: Medicaid Other | Admitting: Cardiovascular Disease

## 2020-11-23 ENCOUNTER — Other Ambulatory Visit: Payer: Self-pay | Admitting: Cardiovascular Disease

## 2020-11-23 VITALS — BP 136/74 | HR 83 | Ht 65.0 in | Wt 239.0 lb

## 2020-11-23 DIAGNOSIS — I10 Essential (primary) hypertension: Secondary | ICD-10-CM | POA: Diagnosis not present

## 2020-11-23 DIAGNOSIS — I480 Paroxysmal atrial fibrillation: Secondary | ICD-10-CM | POA: Diagnosis not present

## 2020-11-23 MED ORDER — METOPROLOL SUCCINATE ER 100 MG PO TB24
100.0000 mg | ORAL_TABLET | Freq: Every day | ORAL | 3 refills | Status: DC
Start: 2020-11-23 — End: 2020-11-23

## 2020-11-23 MED FILL — METOPROLOL SUCCINATE ER 100: 100 | 90 days supply | Qty: 90 | Fill #0

## 2020-11-23 MED FILL — HYDROCHLOROTHIAZIDE 12.5 MG: 12.5 | 90 days supply | Qty: 90 | Fill #1

## 2020-11-23 MED FILL — FERREX 150 CAPSULE: 150 | 90 days supply | Qty: 90 | Fill #2

## 2020-11-23 NOTE — Progress Notes (Signed)
[address], [place], [place]  [address] Phone: [phone] Fax:  [phone]  Date:  11/23/2020   ID:  Shealee, Yordy [birth year], MRN [id number]  PCP:  [facility].  Problem list: 1. Hypertension 2. Hyperlipidemia 3. Paroxysmal atrial fibrillation 4. Obesity 5. Obstructive sleep apnea 6. PVCs       Tanaysha Jillyan Plitt is a 64 y.o. female with a hx of HTN, HL, paroxysmal atrial fibrillation previously on Coumadin, GERD and obesity. LHC (03/2007): Normal coronary arteries, EF 50-55%. Patient was recently admitted 12/10-12/13 after presenting with palpitations. She was found to be in atrial fibrillation with RVR. She converted to NSR on IV diltiazem. It was felt that she needed long-term anticoagulation. CHADS2-VASc=2.  She was placed on Coumadin. Echocardiogram (12/04/2013): Moderate LVH, EF 50-55%, normal wall motion.  She has generally been doing okay since discharge. She feels fatigued. She's had occasional palpitations. These are fairly consistent with what she has had in the past. She denies any prolonged rapid palpitations. She denies chest pain, dyspnea, syncope, orthopnea, PND or edema. She is compliant with her CPAP.   March 12, 2014: Ms. Rumsey has a hx of AF in the remote past.   She has normal coronaries.  She was admitted with Afib- and converted with dilt drip.  She continues to have problems with HTN.  She complains about pain and firmness in her left leg.  She has continued to gain weight He has a history of sleep apnea. She does not look like wearing her CPAP mask because it is uncomfortable at night.  Sept. 16, 2015:  Pt is doing well.  I have met her in the past.  She was seen by [name] in March for follow up for her a-fib.  Still eating salt.- diastolic BP is still elevated.  Unable to exercise due to leg pain  Nov. 24, 2015:  Mr. Tullos is seen back today for follow up of her HTN. We added Amlodipine at  her last visit.  She has not had any further episodes of atrial fib. She has lots of pain in her knees.  Is scheduled to see an orthopedist soon.  Feb. 1, 2017:  She has  Seen Dr. Caryl Comes since I last seen her. She has significant number of premature ventricular contractions. A 24-hour Holter revealed 11% PVC burden. These have greatly improved when she limited caffeine from her diet.  Signal average ECG was normal   Repeat echocardiogram reveals normal left ventricular systolic function. She is doing well.   No dyspnea, no CP  Her CPAP is not working.    She needs to have right knee replacement .     Sept. 6, 2017:  Doing well.  No further palpitations  - better since she is avoiding caffiene.  March 05, 2017:  Kamesha is seen today. Feeling sluggish. Using her CPAP - fairly well 4 hrs at night   Dec. 17, 2019 Chennel is seen today for follow-up of her paroxysmal atrial fibrillation, hypertension, hyperlipidemia, PVC  She needs to have a colonoscopy and is here partly for preoperative evaluation prior to having her colonoscopy.  Remains in NSR  Wt is 263 lbs ,  Wt is up 6 lbs since I last saw her )  Exercises daily   Dec. 16, 2020 Hadyn  is seen today for follow-up of her hypertension and paroxysmal atrial fibrillation.  Her weight today is 250 pounds which is down 13 pounds from last year.  She admits to eating a few more snacks than she should.  November 23, 2020: Jacqueline Petersen is seen today for follow-up visit.  She has a history of hypertension and paroxysmal atrial fibrillation. Wt. Is 239.  Has occasional palpitations.  She ran out of her metoprolol last night    Wt Readings from Last 3 Encounters:  11/23/20 239 lb (108.4 kg)  08/11/20 247 lb (112 kg)  02/24/20 251 lb 6.4 oz (114 kg)     Past Medical History:  Diagnosis Date   A-fib (HCC)    Acid reflux    Arthritis    Dyslipidemia    Fatigue    GERD (gastroesophageal reflux disease)    History of  hysterectomy    Hx of echocardiogram    Echocardiogram (12/15): EF 55-60%, normal wall motion, PASP 32 mmHg   Hyperlipidemia    Hypertension    Hypokalemia    Obesity    Personal history of hypertensive heart disease    Personal history of long-term (current) use of anticoagulants    Shingles     Current Outpatient Medications  Medication Sig Dispense Refill   Cholecalciferol (VITAMIN D) 2000 UNITS CAPS Take 2,000 Units by mouth every evening.     edoxaban (SAVAYSA) 60 MG TABS tablet Take 60 mg by mouth daily. 90 tablet 1   FERREX 150 150 MG capsule Take 150 mg by mouth daily.     hydrochlorothiazide (MICROZIDE) 12.5 MG capsule TAKE 1 CAPSULE BY MOUTH DAILY. 90 capsule 2   metoprolol succinate (TOPROL-XL) 100 MG 24 hr tablet Take 1 tablet by mouth at bedtime.   3   potassium chloride SA (KLOR-CON) 20 MEQ tablet Take 1 tablet (20 mEq total) by mouth daily. 90 tablet 3   rosuvastatin (CRESTOR) 10 MG tablet Take 10 mg by mouth at bedtime.      No current facility-administered medications for this visit.    Allergies:   Lipitor [atorvastatin calcium] and Spironolactone   Social History:  The patient  reports that she has never smoked. She has never used smokeless tobacco. She reports that she does not drink alcohol and does not use drugs.   Family History:  The patient's family history includes Coronary artery disease in an other family member; Heart failure in her cousin; Hypertension in her son and another family member.   ROS:  Please see the history of present illness.   She denies any bleeding problems. She has had some recent URI symptoms. She denies fever.   All other systems reviewed and negative.    Physical Exam: Blood pressure 136/74, pulse 83, height 5' 5"  (1.651 m), weight 239 lb (108.4 kg), SpO2 95 %.  GEN:  Well nourished, well developed in no acute distress HEENT: Normal NECK: No JVD; No carotid bruits LYMPHATICS: No lymphadenopathy CARDIAC: RRR ,  no murmurs, rubs, gallops RESPIRATORY:  Clear to auscultation without rales, wheezing or rhonchi  ABDOMEN: Soft, non-tender, non-distended MUSCULOSKELETAL:  No edema; No deformity  SKIN: Warm and dry NEUROLOGIC:  Alert and oriented x 3    EKG:       ASSESSMENT AND PLAN:  1. Hypertension -    blood pressure is well controlled.  Continue current medications.  2. Hyperlipidemia -      3. Paroxysmal atrial fibrillation -remains in sinus rhythm.  4. Obesity -I advised continued diet, exercise, weight loss.    We will follow up with her in 1 year see an APP.  5. Obstructive sleep apnea -  advised weight loss     [name], MD  11/23/2020 1:54 PM    [place] [place],  [place], [place]  [address] Pager [phone] Phone: [phone]; Fax: [phone]

## 2020-11-23 NOTE — Patient Instructions (Signed)
Medication Instructions:  Your physician recommends that you continue on your current medications as directed. Please refer to the Current Medication list given to you today.  *If you need a refill on your cardiac medications before your next appointment, please call your pharmacy*   Lab Work: None Ordered If you have labs (blood work) drawn today and your tests are completely normal, you will receive your results only by: MyChart Message (if you have MyChart) OR A paper copy in the mail If you have any lab test that is abnormal or we need to change your treatment, we will call you to review the results.   Testing/Procedures: None Ordered   Follow-Up: At [facility], you and your health needs are our priority.  As part of our continuing mission to provide you with exceptional heart care, we have created designated Provider Care Teams.  These Care Teams include your primary Cardiologist (physician) and Advanced Practice Providers (APPs -  Physician Assistants and Nurse Practitioners) who all work together to provide you with the care you need, when you need it.   Your next appointment:   1 year(s)  The format for your next appointment:   In Person  Provider:   You will see one of the following Advanced Practice Providers on your designated Care Team:   Scott Weaver, PA-C Vin Bhagat, PA-C     

## 2020-12-03 ENCOUNTER — Other Ambulatory Visit (HOSPITAL_COMMUNITY): Payer: Self-pay | Admitting: Family Medicine

## 2020-12-03 MED FILL — ROSUVASTATIN CALCIUM 10 MG: 10 | 90 days supply | Qty: 90 | Fill #0

## 2020-12-03 MED FILL — POTASSIUM CL ER 20 MEQ TAB: 20 | 90 days supply | Qty: 90 | Fill #0

## 2020-12-04 MED FILL — SAVAYSA 60 MG TABLET: 60 | 90 days supply | Qty: 90 | Fill #1

## 2020-12-18 ENCOUNTER — Emergency Department (HOSPITAL_COMMUNITY)
Admission: EM | Admit: 2020-12-18 | Discharge: 2020-12-18 | Disposition: A | Discharge status: Home / Self Care | Payer: Medicaid Other | Attending: Emergency Medicine | Admitting: Emergency Medicine

## 2020-12-18 ENCOUNTER — Other Ambulatory Visit: Payer: Self-pay

## 2020-12-18 ENCOUNTER — Encounter (HOSPITAL_COMMUNITY): Payer: Self-pay

## 2020-12-18 DIAGNOSIS — Z79899 Other long term (current) drug therapy: Secondary | ICD-10-CM | POA: Diagnosis not present

## 2020-12-18 DIAGNOSIS — R0981 Nasal congestion: Secondary | ICD-10-CM | POA: Diagnosis present

## 2020-12-18 DIAGNOSIS — I4891 Unspecified atrial fibrillation: Secondary | ICD-10-CM | POA: Insufficient documentation

## 2020-12-18 DIAGNOSIS — J069 Acute upper respiratory infection, unspecified: Secondary | ICD-10-CM | POA: Diagnosis not present

## 2020-12-18 DIAGNOSIS — I10 Essential (primary) hypertension: Secondary | ICD-10-CM | POA: Insufficient documentation

## 2020-12-18 DIAGNOSIS — Z20822 Contact with and (suspected) exposure to covid-19: Secondary | ICD-10-CM | POA: Insufficient documentation

## 2020-12-18 DIAGNOSIS — Z7901 Long term (current) use of anticoagulants: Secondary | ICD-10-CM | POA: Insufficient documentation

## 2020-12-18 LAB — RESP PANEL BY RT-PCR (FLU A&B, COVID) ARPGX2
Influenza A by PCR: NEGATIVE
Influenza B by PCR: NEGATIVE
SARS Coronavirus 2 by RT PCR: NEGATIVE

## 2020-12-18 NOTE — ED Provider Notes (Signed)
[facility] EMERGENCY DEPARTMENT Provider Note   CSN: [id number] Arrival date & time: 12/18/20  1121     History Chief Complaint  Patient presents with  . Covid Exposure  . Nasal Congestion    Jacqueline Petersen is a 64 y.o. female.  She is here with URI symptoms since yesterday evening.  She said she felt cold and possibly had some chills.  She is also had some nasal congestion and sneezing.  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  She has been fully Covid vaccinated and boosted.  No known fevers.  No cough abdominal pain or urinary symptoms.  She was exposed to some family members who are Covid positive.  The history is provided by the patient.  URI Presenting symptoms: congestion and rhinorrhea   Presenting symptoms: no cough and no fever   Severity:  Mild Onset quality:  Gradual Duration:  2 days Timing:  Intermittent Progression:  Unchanged Chronicity:  Recurrent Relieved by:  None tried Worsened by:  Nothing Ineffective treatments:  None tried Associated symptoms: sneezing   Associated symptoms: no headaches and no myalgias   Risk factors: sick contacts        Past Medical History:  Diagnosis Date  . A-fib (Home)   . Acid reflux   . Arthritis   . Dyslipidemia   . Fatigue   . GERD (gastroesophageal reflux disease)   . History of hysterectomy   . Hx of echocardiogram    Echocardiogram (12/15): EF 55-60%, normal wall motion, PASP 32 mmHg  . Hyperlipidemia   . Hypertension   . Hypokalemia   . Obesity   . Personal history of hypertensive heart disease   . Personal history of long-term (current) use of anticoagulants   . Shingles     Patient Active Problem List   Diagnosis Date Noted  . Obstructive sleep apnea 07/17/2017  . Morbid obesity due to excess calories ([place]) 07/17/2017  . PVC's (premature ventricular contractions) 01/26/2016  . Long term (current) use of anticoagulants  12/09/2013  . Atrial fibrillation with RVR ([place]) 12/03/2013  . Hyperlipidemia 06/12/2007  . Essential hypertension 06/12/2007  . ATRIAL FIBRILLATION 06/12/2007  . GERD 06/12/2007    Past Surgical History:  Procedure Laterality Date  . ABDOMINAL HYSTERECTOMY    . CARDIAC CATHETERIZATION  04/15/2007   Est. EF of 50-55% -- Smooth and normal coronary arteries -- Normal left ventricular systolic function.  We will continue with further treatment of her atrial fibrillation     . PARTIAL HYSTERECTOMY       OB History   No obstetric history on file.     Family History  Problem Relation Age of Onset  . Coronary artery disease Other   . Hypertension Other   . Heart failure Cousin   . Hypertension Son   . Heart attack Neg Hx   . Stroke Neg Hx     Social History   Tobacco Use  . Smoking status: Never Smoker  . Smokeless tobacco: Never Used  Substance Use Topics  . Alcohol use: No  . Drug use: No    Home Medications Prior to Admission medications   Medication Sig Start Date End Date Taking? Authorizing Provider  Cholecalciferol (VITAMIN D) 2000 UNITS CAPS Take 2,000 Units by mouth every evening.    [provider]  edoxaban (SAVAYSA) 60 MG TABS tablet Take 60 mg by mouth daily. 09/02/20   Nahser,  Wonda Cheng, MD  FERREX 150 150 MG capsule Take 150 mg by mouth daily. 08/24/20   [provider]  hydrochlorothiazide (MICROZIDE) 12.5 MG capsule TAKE 1 CAPSULE BY MOUTH DAILY. 08/24/20   Nahser, Wonda Cheng, MD  metoprolol succinate (TOPROL-XL) 100 MG 24 hr tablet Take 1 tablet (100 mg total) by mouth at bedtime. 11/23/20   Nahser, Wonda Cheng, MD  potassium chloride SA (KLOR-CON) 20 MEQ tablet Take 1 tablet (20 mEq total) by mouth daily. 03/30/20   [name] T, PA-C  rosuvastatin (CRESTOR) 10 MG tablet Take 10 mg by mouth at bedtime.     [provider]    Allergies    Lipitor [atorvastatin calcium] and Spironolactone  Review of Systems   Review of Systems   Constitutional: Positive for chills. Negative for fever.  HENT: Positive for congestion, rhinorrhea and sneezing.   Eyes: Negative for visual disturbance.  Respiratory: Negative for cough.   Cardiovascular: Negative for chest pain.  Gastrointestinal: Negative for abdominal pain.  Genitourinary: Negative for dysuria.  Musculoskeletal: Negative for myalgias.  Skin: Negative for rash.  Neurological: Negative for headaches.    Physical Exam Updated Vital Signs BP (!) 167/62 (BP Location: Right Arm)   Pulse 79   Temp 98 F (36.7 C) (Oral)   Resp 19   Ht 5\' 3"  (1.6 m)   Wt 108.4 kg   SpO2 98%   BMI 42.34 kg/m   Physical Exam Vitals and nursing note reviewed.  Constitutional:      General: She is not in acute distress.    Appearance: Normal appearance. She is well-developed and well-nourished.  HENT:     Head: Normocephalic and atraumatic.     Nose: Nose normal.     Mouth/Throat:     Mouth: Mucous membranes are moist.     Pharynx: Oropharynx is clear.  Eyes:     Conjunctiva/sclera: Conjunctivae normal.  Cardiovascular:     Rate and Rhythm: Normal rate and regular rhythm.     Heart sounds: No murmur heard.   Pulmonary:     Effort: Pulmonary effort is normal. No respiratory distress.     Breath sounds: Normal breath sounds.  Abdominal:     Palpations: Abdomen is soft.     Tenderness: There is no abdominal tenderness.  Musculoskeletal:        General: No edema.     Cervical back: Neck supple.  Skin:    General: Skin is warm and dry.  Neurological:     General: No focal deficit present.     Mental Status: She is alert.  Psychiatric:        Mood and Affect: Mood and affect normal.     ED Results / Procedures / Treatments   Labs (all labs ordered are listed, but only abnormal results are displayed) Labs Reviewed  RESP PANEL BY RT-PCR (FLU A&B, COVID) ARPGX2    EKG None  Radiology No results found.  Procedures Procedures (including critical care  time)  Medications Ordered in ED Medications - No data to display  ED Course  I have reviewed the triage vital signs and the nursing notes.  Pertinent labs & imaging results that were available during my care of the patient were reviewed by me and considered in my medical decision making (see chart for details).    MDM Rules/Calculators/A&P                         Jacqueline  Keajah Petersen was evaluated in Emergency Department on 12/18/2020 for the symptoms described in the history of present illness. She was evaluated in the context of the global COVID-19 pandemic, which necessitated consideration that the patient might be at risk for infection with the SARS-CoV-2 virus that causes COVID-19. Institutional protocols and algorithms that pertain to the evaluation of patients at risk for COVID-19 are in a state of rapid change based on information released by regulatory bodies including the CDC and federal and state organizations. These policies and algorithms were followed during the patient's care in the ED.  Diagnosis includes Covid, flu, viral syndrome, bronchitis, pneumonia Final Clinical Impression(s) / ED Diagnoses Final diagnoses:  Person under investigation for COVID-19  Upper respiratory tract infection, unspecified type    Rx / DC Orders ED Discharge Orders    None       Hayden Rasmussen, MD 12/18/20 2204

## 2020-12-18 NOTE — ED Triage Notes (Signed)
Pt reports exposure to family members with COVID symptoms. She started having chills and congestion last night. Pt is fully vaccinated for COVID. resp e.u

## 2020-12-18 NOTE — Discharge Instructions (Addendum)
You were evaluated at the emergency department for possible Covid symptoms.  Your Covid test was pending at time of discharge.  Please follow-up with [place] in [place].  If you are Covid positive you will need to isolate up to 10 days.  Please contact the Covid clinic at [facility].  Return to the emergency department if any worsening or concerning symptoms.  If you are Covid positive we will set you up with the monoclonal antibody infusion center.  They will call you on the phone.

## 2020-12-18 NOTE — ED Notes (Signed)
Patient verbalizes understanding of discharge instructions. Opportunity for questioning and answers were provided. Pt discharged from ED. 

## 2020-12-22 ENCOUNTER — Other Ambulatory Visit: Payer: Self-pay | Admitting: Family

## 2020-12-22 ENCOUNTER — Telehealth: Payer: Self-pay | Admitting: *Deleted

## 2020-12-22 DIAGNOSIS — I480 Paroxysmal atrial fibrillation: Secondary | ICD-10-CM

## 2020-12-22 MED ORDER — APIXABAN 5 MG PO TABS: 5.0000 mg | ORAL_TABLET | Freq: Two times a day (BID) | ORAL | 11 refills | Status: DC

## 2020-12-22 MED FILL — ELIQUIS 5 MG TABLET: 5 | 30 days supply | Qty: 60 | Fill #0

## 2020-12-22 NOTE — Telephone Encounter (Signed)
Patient with diagnosis of afib on Savaysa for anticoagulation.    Procedure: right trigger finger release Date of procedure: TBD  CHA2DS2-VASc Score = 2  This indicates a 2.2% annual risk of stroke. The patient's score is based upon: CHF History: No HTN History: Yes Diabetes History: No Stroke History: No Vascular Disease History: No Age Score: 0 Gender Score: 1  CrCl >142mL/min Platelet count 271K  Pt was changed from Eliquis to [facility] 09/01/20 phone note because she could not remember twice daily Eliquis dosing and preferred to take a once daily medication. Since she previously experienced blood in her urine on Xarelto, she was started on Savaysa. Larkin Ina has a Black Box warning to avoid use in patients with CrCl > 67mL/min for afib indication due to increased risk of stroke compared to warfarin. Patient's CrCl has consistently been > 142mL/min due to her weight. Recommend that she change back to Eliquis 5mg  BID and use alarm system to help remember her 2nd dose of the day.  She can hold her DOAC for 1-2 days prior to procedure.

## 2020-12-22 NOTE — Telephone Encounter (Signed)
   Primary Cardiologist: Kristeen Miss, MD  Chart reviewed as part of pre-operative protocol coverage. Patient was contacted 12/22/2020 in reference to pre-operative risk assessment for pending surgery as outlined below.  Jacqueline Petersen was last seen on 11/23/20 by Dr. Elease Hashimoto.  Since that day, Jacqueline Petersen has done well.  Therefore, based on ACC/AHA guidelines, the patient would be at acceptable risk for the planned procedure without further cardiovascular testing. She does mention to me that she plans to defer the surgery for now as her finger is "not bothering me much.   We discussed increased risk of bleeding on Savaysa. She was appreciative of the understanding and agreeable to transition to Eliquis 5mg  BID. We discussed tactics for remembering twice per day such as setting an alarm on her phone. Rx sent to [facility] outpatient pharmacy. She verbalized understanding to stop Savaysa. Will CC her primary cardiologist as FYI.   Per pharmacy review and office protocol, she may hold her Eliquis 1-2 days prior to the planned procedure.   The patient was advised that if she develops new symptoms prior to surgery to contact our office to arrange for a follow-up visit, and she verbalized understanding.  I will route this recommendation to the requesting party via Epic fax function and remove from pre-op pool. Please call with questions.  [facility], NP 12/22/2020, 4:25 PM

## 2020-12-22 NOTE — Telephone Encounter (Signed)
   [facility] Pre-operative Risk Assessment    HEARTCARE STAFF: - Please ensure there is not already an duplicate clearance open for this procedure. - Under Visit Info/Reason for Call, type in Other and utilize the format Clearance MM/DD/YY or Clearance TBD. Do not use dashes or single digits. - If request is for dental extraction, please clarify the # of teeth to be extracted.  Request for surgical clearance:  1. What type of surgery is being performed?  RIGHT FINGER TRIGGER RELEASE     2. When is this surgery scheduled?  TBD   3. What type of clearance is required (medical clearance vs. Pharmacy clearance to hold med vs. Both)?  BOTH  4. Are there any medications that need to be held prior to surgery and how long? SAVAYSA   5. Practice name and name of physician performing surgery?  [facility] / DR. Percell Miller    6. What is the office phone number?  [phone] XT: [phone]   2.   What is the office fax number?  [phone]  8.   Anesthesia type (None, local, MAC, general) ?  LOCAL WITH MAC SEDATION   Jacqueline Petersen 12/22/2020, 1:44 PM  _________________________________________________________________   (provider comments below)

## 2021-01-28 MED FILL — ELIQUIS 5 MG TABLET: 5 | 30 days supply | Qty: 60 | Fill #1

## 2021-02-01 ENCOUNTER — Other Ambulatory Visit: Payer: Self-pay | Admitting: Family Medicine

## 2021-02-01 DIAGNOSIS — Z1231 Encounter for screening mammogram for malignant neoplasm of breast: Secondary | ICD-10-CM

## 2021-02-08 MED FILL — HYDROCHLOROTHIAZIDE 12.5 MG: 12.5 | 90 days supply | Qty: 90 | Fill #2

## 2021-02-23 MED FILL — ELIQUIS 5 MG TABLET: 5 | 30 days supply | Qty: 60 | Fill #2

## 2021-02-23 MED FILL — METOPROLOL SUCCINATE ER 100: 100 | 90 days supply | Qty: 90 | Fill #1

## 2021-02-23 MED FILL — FERREX 150 CAPSULE: 150 | 90 days supply | Qty: 90 | Fill #3

## 2021-03-12 ENCOUNTER — Encounter (HOSPITAL_COMMUNITY): Payer: Self-pay | Admitting: Emergency Medicine

## 2021-03-12 ENCOUNTER — Emergency Department (HOSPITAL_COMMUNITY)
Admission: EM | Admit: 2021-03-12 | Discharge: 2021-03-12 | Disposition: A | Discharge status: Home / Self Care | Payer: Medicaid Other | Attending: Emergency Medicine | Admitting: Emergency Medicine

## 2021-03-12 ENCOUNTER — Other Ambulatory Visit: Payer: Self-pay

## 2021-03-12 ENCOUNTER — Emergency Department (HOSPITAL_COMMUNITY): Payer: Medicaid Other

## 2021-03-12 DIAGNOSIS — I10 Essential (primary) hypertension: Secondary | ICD-10-CM | POA: Diagnosis not present

## 2021-03-12 DIAGNOSIS — I4891 Unspecified atrial fibrillation: Secondary | ICD-10-CM | POA: Insufficient documentation

## 2021-03-12 DIAGNOSIS — Z79899 Other long term (current) drug therapy: Secondary | ICD-10-CM | POA: Insufficient documentation

## 2021-03-12 DIAGNOSIS — R110 Nausea: Secondary | ICD-10-CM | POA: Diagnosis not present

## 2021-03-12 DIAGNOSIS — R519 Headache, unspecified: Secondary | ICD-10-CM | POA: Diagnosis present

## 2021-03-12 DIAGNOSIS — Z7901 Long term (current) use of anticoagulants: Secondary | ICD-10-CM | POA: Diagnosis not present

## 2021-03-12 LAB — BASIC METABOLIC PANEL
Anion gap: 6 (ref 5–15)
BUN: 9 mg/dL (ref 8–23)
CO2: 29 mmol/L (ref 22–32)
Calcium: 9 mg/dL (ref 8.9–10.3)
Chloride: 104 mmol/L (ref 98–111)
Creatinine, Ser: 0.68 mg/dL (ref 0.44–1.00)
GFR, Estimated: >60 mL/min (ref >60)
Glucose, Bld: 108 mg/dL — ABNORMAL HIGH (ref 70–99)
Potassium: 3.8 mmol/L (ref 3.5–5.1)
Sodium: 139 mmol/L (ref 135–145)

## 2021-03-12 LAB — CBC
HCT: 42.6 % (ref 36.0–46.0)
Hemoglobin: 13.5 g/dL (ref 12.0–15.0)
MCH: 28 pg (ref 26.0–34.0)
MCHC: 31.7 g/dL (ref 30.0–36.0)
MCV: 88.4 fL (ref 80.0–100.0)
Platelets: 320 10*3/uL (ref 150–400)
RBC: 4.82 MIL/uL (ref 3.87–5.11)
RDW: 14.3 % (ref 11.5–15.5)
WBC: 7.2 10*3/uL (ref 4.0–10.5)
nRBC: 0 % (ref 0.0–0.2)

## 2021-03-12 LAB — TROPONIN I (HIGH SENSITIVITY): Troponin I (High Sensitivity): 3 ng/L (ref <18)

## 2021-03-12 MED ORDER — HYDROCODONE-ACETAMINOPHEN 5-325 MG PO TABS
1.0000 | ORAL_TABLET | Freq: Once | ORAL | Status: AC
  Administered 2021-03-12: 1 via ORAL
  Filled 2021-03-12: qty 1

## 2021-03-12 NOTE — ED Triage Notes (Signed)
Pt reports headache since this morning and elevated BP.  No arm drift.  States she is also having pain to teeth/jaws/ears.  Rates pain 100/10 this morning and now improved and rates as 10/10.  No neuro deficits noted.

## 2021-03-12 NOTE — ED Notes (Signed)
Pt denies vision changes or dizziness. Pt states she did have nausea.

## 2021-03-12 NOTE — ED Provider Notes (Signed)
[facility] EMERGENCY DEPARTMENT Provider Note   CSN: [id number] Arrival date & time: 03/12/21  1306     History No chief complaint on file.   Jacqueline Petersen is a 65 y.o. female.  She has89 year old female presents with bitemporal headache which occurred several hours prior to arrival.  Associated nausea.  Denies any focal weakness.  Patient took her blood pressure was elevated and took her antihypertensive along with Tylenol and her headache has improved slightly.  Denies any ataxia.  Denies any cardiac symptoms.        Past Medical History:  Diagnosis Date  . A-fib ([place])   . Acid reflux   . Arthritis   . Dyslipidemia   . Fatigue   . GERD (gastroesophageal reflux disease)   . History of hysterectomy   . Hx of echocardiogram    Echocardiogram (12/15): EF 55-60%, normal wall motion, PASP 32 mmHg  . Hyperlipidemia   . Hypertension   . Hypokalemia   . Obesity   . Personal history of hypertensive heart disease   . Personal history of long-term (current) use of anticoagulants   . Shingles     Patient Active Problem List   Diagnosis Date Noted  . Obstructive sleep apnea 07/17/2017  . Morbid obesity due to excess calories ([place]) 07/17/2017  . PVC's (premature ventricular contractions) 01/26/2016  . Long term (current) use of anticoagulants 12/09/2013  . Atrial fibrillation with RVR ([place]) 12/03/2013  . Hyperlipidemia 06/12/2007  . Essential hypertension 06/12/2007  . ATRIAL FIBRILLATION 06/12/2007  . GERD 06/12/2007    Past Surgical History:  Procedure Laterality Date  . ABDOMINAL HYSTERECTOMY    . CARDIAC CATHETERIZATION  04/15/2007   Est. EF of 50-55% -- Smooth and normal coronary arteries -- Normal left ventricular systolic function.  We will continue with further treatment of her atrial fibrillation     . PARTIAL HYSTERECTOMY       OB History   No obstetric history on file.     Family History  Problem Relation Age of Onset   . Coronary artery disease Other   . Hypertension Other   . Heart failure Cousin   . Hypertension Son   . Heart attack Neg Hx   . Stroke Neg Hx     Social History   Tobacco Use  . Smoking status: Never Smoker  . Smokeless tobacco: Never Used  Substance Use Topics  . Alcohol use: No  . Drug use: No    Home Medications Prior to Admission medications   Medication Sig Start Date End Date Taking? Authorizing Provider  apixaban (ELIQUIS) 5 MG TABS tablet Take 1 tablet (5 mg total) by mouth 2 (two) times daily. 12/22/20   Loel Dubonnet, NP  Cholecalciferol (VITAMIN D) 2000 UNITS CAPS Take 2,000 Units by mouth every evening.    [provider]  FERREX 150 150 MG capsule Take 150 mg by mouth daily. 08/24/20   [provider]  hydrochlorothiazide (MICROZIDE) 12.5 MG capsule TAKE 1 CAPSULE BY MOUTH DAILY. 08/24/20   Nahser, Wonda Cheng, MD  metoprolol succinate (TOPROL-XL) 100 MG 24 hr tablet Take 1 tablet (100 mg total) by mouth at bedtime. 11/23/20   Nahser, Wonda Cheng, MD  potassium chloride SA (KLOR-CON) 20 MEQ tablet Take 1 tablet (20 mEq total) by mouth daily. 03/30/20   [name] T, PA-C  rosuvastatin (CRESTOR) 10 MG tablet Take 10 mg by mouth at bedtime.     [provider]  Allergies    Lipitor [atorvastatin calcium] and Spironolactone  Review of Systems   Review of Systems  All other systems reviewed and are negative.   Physical Exam Updated Vital Signs BP (!) 158/86 (BP Location: Left Arm)   Pulse (!) 108   Temp 98.4 F (36.9 C) (Oral)   Resp 18   SpO2 100%   Physical Exam Vitals and nursing note reviewed.  Constitutional:      General: She is not in acute distress.    Appearance: Normal appearance. She is well-developed. She is not toxic-appearing.  HENT:     Head: Normocephalic and atraumatic.  Eyes:     General: Lids are normal.     Conjunctiva/sclera: Conjunctivae normal.     Pupils: Pupils are equal, round, and reactive to  light.  Neck:     Thyroid: No thyroid mass.     Trachea: No tracheal deviation.  Cardiovascular:     Rate and Rhythm: Normal rate and regular rhythm.     Heart sounds: Normal heart sounds. No murmur heard. No gallop.   Pulmonary:     Effort: Pulmonary effort is normal. No respiratory distress.     Breath sounds: Normal breath sounds. No stridor. No decreased breath sounds, wheezing, rhonchi or rales.  Abdominal:     General: Bowel sounds are normal. There is no distension.     Palpations: Abdomen is soft.     Tenderness: There is no abdominal tenderness. There is no rebound.  Musculoskeletal:        General: No tenderness. Normal range of motion.     Cervical back: Normal range of motion and neck supple.  Skin:    General: Skin is warm and dry.     Findings: No abrasion or rash.  Neurological:     General: No focal deficit present.     Mental Status: She is alert and oriented to person, place, and time.     GCS: GCS eye subscore is 4. GCS verbal subscore is 5. GCS motor subscore is 6.     Cranial Nerves: No cranial nerve deficit.     Sensory: No sensory deficit.     Motor: Motor function is intact.     Coordination: Coordination is intact.     Gait: Gait is intact.  Psychiatric:        Speech: Speech normal.        Behavior: Behavior normal.     ED Results / Procedures / Treatments   Labs (all labs ordered are listed, but only abnormal results are displayed) Labs Reviewed  BASIC METABOLIC PANEL - Abnormal; Notable for the following components:      Result Value   Glucose, Bld 108 (*)    All other components within normal limits  CBC  TROPONIN I (HIGH SENSITIVITY)  TROPONIN I (HIGH SENSITIVITY)    EKG EKG Interpretation  Date/Time:  Saturday March 12 2021 14:18:58 EDT Ventricular Rate:  61 PR Interval:  146 QRS Duration: 82 QT Interval:  428 QTC Calculation: 430 R Axis:   -34 Text Interpretation: Normal sinus rhythm Left axis deviation Minimal voltage  criteria for LVH, may be normal variant ( R in aVL ) Possible Anterior infarct , age undetermined Abnormal ECG No significant change since last tracing Confirmed by Lacretia Leigh ([id number]) on 03/12/2021 6:49:08 PM   Radiology DG Chest 2 View  Result Date: 03/12/2021 CLINICAL DATA:  Chest pain EXAM: CHEST - 2 VIEW COMPARISON:  Chest radiograph dated 10/12/2016 FINDINGS: The  heart size is borderline enlarged. The mediastinal contours are within normal limits. Both lungs are clear. The visualized skeletal structures are unremarkable. IMPRESSION: Borderline cardiomegaly. No active cardiopulmonary disease. Electronically Signed   By: Zerita Boers M.D.   On: 03/12/2021 15:15    Procedures Procedures   Medications Ordered in ED Medications  HYDROcodone-acetaminophen (NORCO/VICODIN) 5-325 MG per tablet 1 tablet (has no administration in time range)    ED Course  I have reviewed the triage vital signs and the nursing notes.  Pertinent labs & imaging results that were available during my care of the patient were reviewed by me and considered in my medical decision making (see chart for details).    MDM Rules/Calculators/A&P                          Patient medicated for pain here.  Head CT is negative.  Will discharge home Final Clinical Impression(s) / ED Diagnoses Final diagnoses:  None    Rx / DC Orders ED Discharge Orders    None       Lacretia Leigh, MD 03/12/21 1954

## 2021-03-23 ENCOUNTER — Ambulatory Visit
Admission: RE | Admit: 2021-03-23 | Discharge: 2021-03-23 | Disposition: A | Discharge status: Home / Self Care | Payer: Medicaid Other | Source: Ambulatory Visit | Attending: Family Medicine | Admitting: Family Medicine

## 2021-03-23 ENCOUNTER — Other Ambulatory Visit: Payer: Self-pay

## 2021-03-23 DIAGNOSIS — Z1231 Encounter for screening mammogram for malignant neoplasm of breast: Secondary | ICD-10-CM

## 2021-03-25 MED FILL — ELIQUIS 5 MG TABLET: 5 | 30 days supply | Qty: 60 | Fill #3

## 2021-03-25 MED FILL — ROSUVASTATIN CALCIUM 10 MG: 10 | 90 days supply | Qty: 90 | Fill #1

## 2021-03-26 ENCOUNTER — Other Ambulatory Visit (HOSPITAL_COMMUNITY): Payer: Self-pay

## 2021-03-26 MED FILL — Potassium Chloride Tab ER 20 mEq (1500 MG): ORAL | 90 days supply | Qty: 90 | Fill #0 | Status: AC

## 2021-03-28 ENCOUNTER — Other Ambulatory Visit (HOSPITAL_COMMUNITY): Payer: Self-pay

## 2021-03-29 ENCOUNTER — Other Ambulatory Visit (HOSPITAL_COMMUNITY): Payer: Self-pay

## 2021-03-30 ENCOUNTER — Other Ambulatory Visit (HOSPITAL_COMMUNITY): Payer: Self-pay

## 2021-04-12 ENCOUNTER — Emergency Department (HOSPITAL_COMMUNITY)
Admission: EM | Admit: 2021-04-12 | Discharge: 2021-04-13 | Disposition: A | Discharge status: Home / Self Care | Payer: Medicaid Other | Attending: Emergency Medicine | Admitting: Emergency Medicine

## 2021-04-12 ENCOUNTER — Other Ambulatory Visit (HOSPITAL_COMMUNITY): Payer: Self-pay

## 2021-04-12 ENCOUNTER — Other Ambulatory Visit: Payer: Self-pay

## 2021-04-12 ENCOUNTER — Encounter (HOSPITAL_COMMUNITY): Payer: Self-pay

## 2021-04-12 DIAGNOSIS — J069 Acute upper respiratory infection, unspecified: Secondary | ICD-10-CM | POA: Diagnosis not present

## 2021-04-12 DIAGNOSIS — J3489 Other specified disorders of nose and nasal sinuses: Secondary | ICD-10-CM | POA: Diagnosis not present

## 2021-04-12 DIAGNOSIS — R519 Headache, unspecified: Secondary | ICD-10-CM | POA: Diagnosis present

## 2021-04-12 DIAGNOSIS — Z20822 Contact with and (suspected) exposure to covid-19: Secondary | ICD-10-CM | POA: Insufficient documentation

## 2021-04-12 DIAGNOSIS — Z7901 Long term (current) use of anticoagulants: Secondary | ICD-10-CM | POA: Insufficient documentation

## 2021-04-12 DIAGNOSIS — Z79899 Other long term (current) drug therapy: Secondary | ICD-10-CM | POA: Insufficient documentation

## 2021-04-12 DIAGNOSIS — I10 Essential (primary) hypertension: Secondary | ICD-10-CM | POA: Diagnosis not present

## 2021-04-12 MED FILL — Metoprolol Succinate Tab ER 24HR 100 MG (Tartrate Equiv): ORAL | 90 days supply | Qty: 90 | Fill #0 | Status: CN

## 2021-04-12 NOTE — ED Triage Notes (Signed)
Reports exposure to COVID positive person and having headache, congestion, coughing up yellow sputum.   Denies any fever. Completely vaccinated against covid. (2 shot and 2 boosters.)

## 2021-04-12 NOTE — ED Triage Notes (Signed)
Emergency Medicine Provider Triage Evaluation Note  Jacqueline Petersen , a 65 y.o. female  was evaluated in triage.  Pt complains of being exposed to COVID. Has cough and congestion and head pain for the past couple of days after exposure. Has been vaccinated and double boosted.   Review of Systems  Positive: Cough, sinus pain, congestion  Negative: ST  Physical Exam  There were no vitals taken for this visit. Gen:   Awake, no distress   HEENT:  Atraumatic  Resp:  Normal effort  Cardiac:  Normal rate  Abd:   Nondistended, nontender  MSK:   Moves extremities without difficulty  Neuro:  Speech clear   Medical Decision Making  Medically screening exam initiated at 9:27 PM.  Appropriate orders placed.  Jacqueline Petersen was informed that the remainder of the evaluation will be completed by another provider, this initial triage assessment does not replace that evaluation, and the importance of remaining in the ED until their evaluation is complete.  Clinical Impression  MSE was initiated and I personally evaluated the patient and placed orders (if any) at  9:32 PM on April 12, 2021.  The patient appears stable so that the remainder of the MSE may be completed by another provider.    Alfredia Client, PA-C 04/12/21 2132

## 2021-04-13 LAB — SARS CORONAVIRUS 2 (TAT 6-24 HRS): SARS Coronavirus 2: NEGATIVE

## 2021-04-13 NOTE — Discharge Instructions (Addendum)
COVID test was negative.

## 2021-04-13 NOTE — ED Provider Notes (Signed)
[facility] EMERGENCY DEPARTMENT Provider Note   CSN: [id number] Arrival date & time: 04/12/21  2124     History Chief Complaint  Patient presents with  . Headache  . Cough  . Covid Exposure    Jacqueline Petersen is a 65 y.o. female.  65 year old female who presents emerged department today basically for COVID test.  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  She has a headache but she had a multiple times in the past.  Blood pressure is a bit elevated but she states it stays elevated she is working with her PCP to get that worked on.  She had about a cough and runny nose over the last few days after being exposed on Thursday.  No other associated symptoms.  No fevers.  She has been vaccinated and had a booster shot.   Headache Associated symptoms: cough   Cough Associated symptoms: headaches        Past Medical History:  Diagnosis Date  . A-fib ([place])   . Acid reflux   . Arthritis   . Dyslipidemia   . Fatigue   . GERD (gastroesophageal reflux disease)   . History of hysterectomy   . Hx of echocardiogram    Echocardiogram (12/15): EF 55-60%, normal wall motion, PASP 32 mmHg  . Hyperlipidemia   . Hypertension   . Hypokalemia   . Obesity   . Personal history of hypertensive heart disease   . Personal history of long-term (current) use of anticoagulants   . Shingles     Patient Active Problem List   Diagnosis Date Noted  . Obstructive sleep apnea 07/17/2017  . Morbid obesity due to excess calories ([place]) 07/17/2017  . PVC's (premature ventricular contractions) 01/26/2016  . Long term (current) use of anticoagulants 12/09/2013  . Atrial fibrillation with RVR ([place]) 12/03/2013  . Hyperlipidemia 06/12/2007  . Essential hypertension 06/12/2007  . ATRIAL FIBRILLATION 06/12/2007  . GERD 06/12/2007    Past Surgical History:  Procedure Laterality Date  .  ABDOMINAL HYSTERECTOMY    . CARDIAC CATHETERIZATION  04/15/2007   Est. EF of 50-55% -- Smooth and normal coronary arteries -- Normal left ventricular systolic function.  We will continue with further treatment of her atrial fibrillation     . PARTIAL HYSTERECTOMY       OB History   No obstetric history on file.     Family History  Problem Relation Age of Onset  . Coronary artery disease Other   . Hypertension Other   . Heart failure Cousin   . Hypertension Son   . Heart attack Neg Hx   . Stroke Neg Hx     Social History   Tobacco Use  . Smoking status: Never Smoker  . Smokeless tobacco: Never Used  Substance Use Topics  . Alcohol use: No  . Drug use: No    Home Medications Prior to Admission medications   Medication Sig Start Date End Date Taking? Authorizing Provider  apixaban (ELIQUIS) 5 MG TABS tablet TAKE 1 TABLET BY MOUTH 2 TIMES DAILY (DISCONTINUE SAVAYSA) 12/22/20 12/22/21  Loel Dubonnet, NP  Cholecalciferol (VITAMIN D) 2000 UNITS CAPS Take 2,000 Units by mouth every evening.    [provider]  FERREX 150 150 MG capsule Take 150 mg by mouth daily. 08/24/20   [provider]  hydrochlorothiazide (MICROZIDE) 12.5 MG capsule TAKE 1 CAPSULE BY  MOUTH DAILY. 08/24/20 08/24/21  Nahser, Wonda Cheng, MD  iron polysaccharides (NIFEREX) 150 MG capsule TAKE 1 CAPSULE BY MOUTH ONCE A DAY 06/04/20 06/04/21  Harlan Stains, MD  metoprolol succinate (TOPROL-XL) 100 MG 24 hr tablet TAKE 1 TABLET BY MOUTH AT BEDTIME 11/23/20 11/23/21  Nahser, Wonda Cheng, MD  Potassium Chloride ER 20 MEQ TBCR TAKE 1 TABLET BY MOUTH ONCE A DAY WITH FOOD 12/03/20 12/03/21  Donald Prose, MD  potassium chloride SA (KLOR-CON) 20 MEQ tablet TAKE 1 TABLET BY MOUTH ONCE DAILY 03/30/20 03/30/21  [name] T, PA-C  rosuvastatin (CRESTOR) 10 MG tablet Take 10 mg by mouth at bedtime.     [provider]  rosuvastatin (CRESTOR) 10 MG tablet TAKE 1 TABLET BY MOUTH ONCE A DAY 12/03/20 12/03/21   Donald Prose, MD    Allergies    Lipitor [atorvastatin calcium] and Spironolactone  Review of Systems   Review of Systems  Respiratory: Positive for cough.   Neurological: Positive for headaches.  All other systems reviewed and are negative.   Physical Exam Updated Vital Signs BP (!) 178/80   Pulse 61   Temp 97.8 F (36.6 C) (Oral)   Resp 18   Ht 5\' 3"  (1.6 m)   Wt 117.9 kg   SpO2 97%   BMI 46.06 kg/m   Physical Exam Vitals and nursing note reviewed.  Constitutional:      Appearance: She is well-developed.  HENT:     Head: Normocephalic and atraumatic.     Nose: Congestion and rhinorrhea present.     Mouth/Throat:     Mouth: Mucous membranes are moist.     Pharynx: Oropharynx is clear.  Eyes:     Pupils: Pupils are equal, round, and reactive to light.  Cardiovascular:     Rate and Rhythm: Normal rate and regular rhythm.  Pulmonary:     Effort: No respiratory distress.     Breath sounds: No stridor.  Abdominal:     General: There is no distension.  Musculoskeletal:        General: No swelling or tenderness. Normal range of motion.     Cervical back: Normal range of motion.  Skin:    General: Skin is warm and dry.  Neurological:     General: No focal deficit present.     Mental Status: She is alert.  Psychiatric:        Mood and Affect: Mood normal.     ED Results / Procedures / Treatments   Labs (all labs ordered are listed, but only abnormal results are displayed) Labs Reviewed  SARS CORONAVIRUS 2 (TAT 6-24 HRS)    EKG None  Radiology No results found.  Procedures Procedures   Medications Ordered in ED Medications - No data to display  ED Course  I have reviewed the triage vital signs and the nursing notes.  Pertinent labs & imaging results that were available during my care of the patient were reviewed by me and considered in my medical decision making (see chart for details).    MDM Rules/Calculators/A&P                           URI and HTN, neither with red flags. PCP follow up.   Final Clinical Impression(s) / ED Diagnoses Final diagnoses:  Upper respiratory tract infection, unspecified type    Rx / DC Orders ED Discharge Orders    None       Blayze Haen,  Corene Cornea, MD 04/13/21 8882

## 2021-05-03 ENCOUNTER — Other Ambulatory Visit: Payer: Self-pay | Admitting: Cardiovascular Disease

## 2021-05-03 ENCOUNTER — Other Ambulatory Visit (HOSPITAL_COMMUNITY): Payer: Self-pay

## 2021-05-03 DIAGNOSIS — I10 Essential (primary) hypertension: Secondary | ICD-10-CM

## 2021-05-03 MED ORDER — POLYSACCHARIDE IRON COMPLEX 150 MG PO CAPS
ORAL_CAPSULE | ORAL | 0 refills | Status: DC
  Filled 2021-05-03: qty 30, 30d supply, fill #0
  Filled 2021-06-25 (×2): qty 30, 30d supply, fill #1
  Filled 2021-08-01: qty 30, 30d supply, fill #2

## 2021-05-03 MED ORDER — HYDROCHLOROTHIAZIDE 12.5 MG PO CAPS
12.5000 mg | ORAL_CAPSULE | Freq: Every day | ORAL | 1 refills | Status: DC
  Filled 2021-05-03: qty 90, 90d supply, fill #0

## 2021-05-03 MED FILL — Metoprolol Succinate Tab ER 24HR 100 MG (Tartrate Equiv): ORAL | 90 days supply | Qty: 90 | Fill #0 | Status: AC

## 2021-05-03 MED FILL — Apixaban Tab 5 MG: ORAL | 30 days supply | Qty: 60 | Fill #0 | Status: AC

## 2021-05-04 ENCOUNTER — Other Ambulatory Visit (HOSPITAL_COMMUNITY): Payer: Self-pay

## 2021-05-05 ENCOUNTER — Other Ambulatory Visit (HOSPITAL_COMMUNITY): Payer: Self-pay

## 2021-05-06 ENCOUNTER — Other Ambulatory Visit (HOSPITAL_COMMUNITY): Payer: Self-pay

## 2021-05-10 ENCOUNTER — Other Ambulatory Visit (HOSPITAL_COMMUNITY): Payer: Self-pay

## 2021-05-26 DIAGNOSIS — Z79899 Other long term (current) drug therapy: Secondary | ICD-10-CM | POA: Insufficient documentation

## 2021-05-26 DIAGNOSIS — I10 Essential (primary) hypertension: Secondary | ICD-10-CM | POA: Insufficient documentation

## 2021-05-26 DIAGNOSIS — Z7901 Long term (current) use of anticoagulants: Secondary | ICD-10-CM | POA: Diagnosis not present

## 2021-05-26 DIAGNOSIS — U071 COVID-19: Secondary | ICD-10-CM | POA: Diagnosis not present

## 2021-05-26 DIAGNOSIS — I4891 Unspecified atrial fibrillation: Secondary | ICD-10-CM | POA: Diagnosis not present

## 2021-05-26 DIAGNOSIS — R519 Headache, unspecified: Secondary | ICD-10-CM | POA: Diagnosis present

## 2021-05-27 ENCOUNTER — Other Ambulatory Visit: Payer: Self-pay

## 2021-05-27 ENCOUNTER — Emergency Department (HOSPITAL_COMMUNITY)
Admission: EM | Admit: 2021-05-27 | Discharge: 2021-05-27 | Disposition: A | Discharge status: Home / Self Care | Payer: Medicaid Other | Attending: Emergency Medicine | Admitting: Emergency Medicine

## 2021-05-27 DIAGNOSIS — R519 Headache, unspecified: Secondary | ICD-10-CM

## 2021-05-27 DIAGNOSIS — Z20822 Contact with and (suspected) exposure to covid-19: Secondary | ICD-10-CM

## 2021-05-27 LAB — RESP PANEL BY RT-PCR (FLU A&B, COVID) ARPGX2
Influenza A by PCR: NEGATIVE
Influenza B by PCR: NEGATIVE
SARS Coronavirus 2 by RT PCR: POSITIVE — AB

## 2021-05-27 NOTE — Discharge Instructions (Addendum)
The result of your COVID test will be available in MyChart in 2-3 hours. If positive, please follow the instructions provided in your discharge papers.   Return to the ED with any new or worsening symptoms.

## 2021-05-27 NOTE — ED Triage Notes (Signed)
Exposed to COVID positive husband with cough, sore throat, generalized weakness and headache.

## 2021-05-27 NOTE — ED Provider Notes (Signed)
[facility] EMERGENCY DEPARTMENT Provider Note   CSN: [id number] Arrival date & time: 05/26/21  2348     History Chief Complaint  Patient presents with  . Covid Exposure    Jacqueline Petersen is a 65 y.o. female.  Patient to ED for evaluation of mild headache starting today. She reports she was directly exposed to a family member who is COVID+. She has symptoms of mild, dry cough, sore throat, feels generally weak with a mild headache. No SOB, vomiting.   The history is provided by the patient. No language interpreter was used.       Past Medical History:  Diagnosis Date  . A-fib ([place])   . Acid reflux   . Arthritis   . Dyslipidemia   . Fatigue   . GERD (gastroesophageal reflux disease)   . History of hysterectomy   . Hx of echocardiogram    Echocardiogram (12/15): EF 55-60%, normal wall motion, PASP 32 mmHg  . Hyperlipidemia   . Hypertension   . Hypokalemia   . Obesity   . Personal history of hypertensive heart disease   . Personal history of long-term (current) use of anticoagulants   . Shingles     Patient Active Problem List   Diagnosis Date Noted  . Obstructive sleep apnea 07/17/2017  . Morbid obesity due to excess calories ([place]) 07/17/2017  . PVC's (premature ventricular contractions) 01/26/2016  . Long term (current) use of anticoagulants 12/09/2013  . Atrial fibrillation with RVR ([place]) 12/03/2013  . Hyperlipidemia 06/12/2007  . Essential hypertension 06/12/2007  . ATRIAL FIBRILLATION 06/12/2007  . GERD 06/12/2007    Past Surgical History:  Procedure Laterality Date  . ABDOMINAL HYSTERECTOMY    . CARDIAC CATHETERIZATION  04/15/2007   Est. EF of 50-55% -- Smooth and normal coronary arteries -- Normal left ventricular systolic function.  We will continue with further treatment of her atrial fibrillation     . PARTIAL HYSTERECTOMY       OB History   No obstetric history on file.     Family History  Problem Relation Age  of Onset  . Coronary artery disease Other   . Hypertension Other   . Heart failure Cousin   . Hypertension Son   . Heart attack Neg Hx   . Stroke Neg Hx     Social History   Tobacco Use  . Smoking status: Never Smoker  . Smokeless tobacco: Never Used  Substance Use Topics  . Alcohol use: No  . Drug use: No    Home Medications Prior to Admission medications   Medication Sig Start Date End Date Taking? Authorizing Provider  apixaban (ELIQUIS) 5 MG TABS tablet TAKE 1 TABLET BY MOUTH 2 TIMES DAILY (DISCONTINUE SAVAYSA) 12/22/20 12/22/21  Loel Dubonnet, NP  Cholecalciferol (VITAMIN D) 2000 UNITS CAPS Take 2,000 Units by mouth every evening.    [provider]  FERREX 150 150 MG capsule Take 150 mg by mouth daily. 08/24/20   [provider]  hydrochlorothiazide (MICROZIDE) 12.5 MG capsule Take 1 capsule (12.5 mg total) by mouth daily. 05/03/21 05/03/22  Nahser, Wonda Cheng, MD  iron polysaccharides (NIFEREX) 150 MG capsule TAKE 1 CAPSULE BY MOUTH ONCE A DAY 06/04/20 06/04/21  Harlan Stains, MD  iron polysaccharides (NIFEREX) 150 MG capsule Take 1 capsule by mouth once a day 05/03/21     metoprolol succinate (TOPROL-XL) 100 MG 24 hr tablet TAKE 1 TABLET BY MOUTH AT BEDTIME 11/23/20 11/23/21  Nahser, Wonda Cheng,  MD  Potassium Chloride ER 20 MEQ TBCR TAKE 1 TABLET BY MOUTH ONCE A DAY WITH FOOD 12/03/20 12/03/21  Donald Prose, MD  potassium chloride SA (KLOR-CON) 20 MEQ tablet TAKE 1 TABLET BY MOUTH ONCE DAILY 03/30/20 03/30/21  [name] T, PA-C  rosuvastatin (CRESTOR) 10 MG tablet Take 10 mg by mouth at bedtime.     [provider]  rosuvastatin (CRESTOR) 10 MG tablet TAKE 1 TABLET BY MOUTH ONCE A DAY 12/03/20 12/03/21  Donald Prose, MD    Allergies    Lipitor [atorvastatin calcium] and Spironolactone  Review of Systems   Review of Systems  Constitutional: Negative for chills and fever.  HENT: Positive for sore throat.   Respiratory: Positive for cough.    Cardiovascular: Negative.   Gastrointestinal: Negative.   Musculoskeletal: Negative.   Skin: Negative.   Neurological: Positive for weakness and headaches.    Physical Exam Updated Vital Signs BP (!) 187/81 (BP Location: Right Arm)   Pulse 62   Temp 98.5 F (36.9 C) (Oral)   Resp 17   Ht 5\' 3"  (1.6 m)   Wt 117 kg   SpO2 92%   BMI 45.69 kg/m   Physical Exam Vitals and nursing note reviewed.  Constitutional:      Appearance: She is well-developed.  HENT:     Head: Normocephalic.  Cardiovascular:     Rate and Rhythm: Normal rate and regular rhythm.  Pulmonary:     Effort: Pulmonary effort is normal.     Breath sounds: Normal breath sounds. No wheezing, rhonchi or rales.  Abdominal:     General: Bowel sounds are normal.     Palpations: Abdomen is soft.     Tenderness: There is no abdominal tenderness. There is no guarding or rebound.  Musculoskeletal:        General: Normal range of motion.     Cervical back: Normal range of motion and neck supple.  Skin:    General: Skin is warm and dry.     Findings: No rash.  Neurological:     General: No focal deficit present.     Mental Status: She is alert and oriented to person, place, and time.     ED Results / Procedures / Treatments   Labs (all labs ordered are listed, but only abnormal results are displayed) Labs Reviewed  RESP PANEL BY RT-PCR (FLU A&B, COVID) [id number]    EKG None  Radiology No results found.  Procedures Procedures   Medications Ordered in ED Medications - No data to display  ED Course  I have reviewed the triage vital signs and the nursing notes.  Pertinent labs & imaging results that were available during my care of the patient were reviewed by me and considered in my medical decision making (see chart for details).    MDM Rules/Calculators/A&P                          Patient to ED with sxs as detailed in HPI with known COVID exposure.   She is very well appearing. O2 saturation  rechecked by me and found to be 96%.   She is felt appropriate for discharge home to receive COVID test result via [place]. Patient has no unaddressed concerns.  Final Clinical Impression(s) / ED Diagnoses Final diagnoses:  Acute nonintractable headache, unspecified headache type  Exposure to COVID-19 virus    Rx / DC Orders ED Discharge Orders    None  Charlann Lange, PA-C 05/27/21 0014    Mesner, Corene Cornea, MD 05/27/21 [phone]

## 2021-05-29 ENCOUNTER — Emergency Department (HOSPITAL_COMMUNITY)
Admission: EM | Admit: 2021-05-29 | Discharge: 2021-05-29 | Disposition: A | Discharge status: Home / Self Care | Payer: Medicaid Other | Attending: Emergency Medicine | Admitting: Emergency Medicine

## 2021-05-29 ENCOUNTER — Encounter (HOSPITAL_COMMUNITY): Payer: Self-pay | Admitting: Emergency Medicine

## 2021-05-29 ENCOUNTER — Other Ambulatory Visit: Payer: Self-pay

## 2021-05-29 DIAGNOSIS — Z7189 Other specified counseling: Secondary | ICD-10-CM

## 2021-05-29 DIAGNOSIS — Z79899 Other long term (current) drug therapy: Secondary | ICD-10-CM | POA: Diagnosis not present

## 2021-05-29 DIAGNOSIS — I10 Essential (primary) hypertension: Secondary | ICD-10-CM | POA: Diagnosis not present

## 2021-05-29 DIAGNOSIS — Z7901 Long term (current) use of anticoagulants: Secondary | ICD-10-CM | POA: Diagnosis not present

## 2021-05-29 DIAGNOSIS — R059 Cough, unspecified: Secondary | ICD-10-CM | POA: Diagnosis present

## 2021-05-29 DIAGNOSIS — R531 Weakness: Secondary | ICD-10-CM | POA: Insufficient documentation

## 2021-05-29 NOTE — ED Notes (Signed)
Discharged by PA at triage.

## 2021-05-29 NOTE — ED Triage Notes (Signed)
Tested COVID + on Friday.  C/o cough, scratchy throat, weakness, and fatigue.  Pt states she is wanting to know if she can watch her grandchild tomorrow for her daughter to start a new job.

## 2021-05-29 NOTE — Discharge Instructions (Addendum)
Isolate and quarantine until 6/7.  If your symptoms are improving and you are no longer having fevers you can resume regular activities  Return for worsening symptoms

## 2021-05-29 NOTE — ED Provider Notes (Signed)
[facility] EMERGENCY DEPARTMENT Provider Note   CSN: [id number] Arrival date & time: 05/29/21  1001     History Chief Complaint  Patient presents with  . Covid Positive    Jacqueline Petersen is a 65 y.o. female here for evaluation of symptoms of COVID. Reports cough, generalized weakness for several days. Came to ED and tested positive 6/2.  Here today because she says she is supposed to start taking care of her grandchild tomorrow because her daughter is starting a new job and she isn't sure if she should or not.  Feels like her symptoms are better. No fever, CP, SOB, vomiting, diarrhea.   HPI     Past Medical History:  Diagnosis Date  . A-fib ([place])   . Acid reflux   . Arthritis   . Dyslipidemia   . Fatigue   . GERD (gastroesophageal reflux disease)   . History of hysterectomy   . Hx of echocardiogram    Echocardiogram (12/15): EF 55-60%, normal wall motion, PASP 32 mmHg  . Hyperlipidemia   . Hypertension   . Hypokalemia   . Obesity   . Personal history of hypertensive heart disease   . Personal history of long-term (current) use of anticoagulants   . Shingles     Patient Active Problem List   Diagnosis Date Noted  . Obstructive sleep apnea 07/17/2017  . Morbid obesity due to excess calories ([place]) 07/17/2017  . PVC's (premature ventricular contractions) 01/26/2016  . Long term (current) use of anticoagulants 12/09/2013  . Atrial fibrillation with RVR ([place]) 12/03/2013  . Hyperlipidemia 06/12/2007  . Essential hypertension 06/12/2007  . ATRIAL FIBRILLATION 06/12/2007  . GERD 06/12/2007    Past Surgical History:  Procedure Laterality Date  . ABDOMINAL HYSTERECTOMY    . CARDIAC CATHETERIZATION  04/15/2007   Est. EF of 50-55% -- Smooth and normal coronary arteries -- Normal left ventricular systolic function.  We will continue with further treatment of her atrial fibrillation     . PARTIAL HYSTERECTOMY       OB History   No obstetric  history on file.     Family History  Problem Relation Age of Onset  . Coronary artery disease Other   . Hypertension Other   . Heart failure Cousin   . Hypertension Son   . Heart attack Neg Hx   . Stroke Neg Hx     Social History   Tobacco Use  . Smoking status: Never Smoker  . Smokeless tobacco: Never Used  Substance Use Topics  . Alcohol use: No  . Drug use: No    Home Medications Prior to Admission medications   Medication Sig Start Date End Date Taking? Authorizing Provider  apixaban (ELIQUIS) 5 MG TABS tablet TAKE 1 TABLET BY MOUTH 2 TIMES DAILY (DISCONTINUE SAVAYSA) 12/22/20 12/22/21  Loel Dubonnet, NP  Cholecalciferol (VITAMIN D) 2000 UNITS CAPS Take 2,000 Units by mouth every evening.    [provider]  FERREX 150 150 MG capsule Take 150 mg by mouth daily. 08/24/20   [provider]  hydrochlorothiazide (MICROZIDE) 12.5 MG capsule Take 1 capsule (12.5 mg total) by mouth daily. 05/03/21 05/03/22  Nahser, Wonda Cheng, MD  iron polysaccharides (NIFEREX) 150 MG capsule TAKE 1 CAPSULE BY MOUTH ONCE A DAY 06/04/20 06/04/21  Harlan Stains, MD  iron polysaccharides (NIFEREX) 150 MG capsule Take 1 capsule by mouth once a day 05/03/21     metoprolol succinate (TOPROL-XL) 100 MG 24 hr tablet TAKE  1 TABLET BY MOUTH AT BEDTIME 11/23/20 11/23/21  Nahser, Wonda Cheng, MD  Potassium Chloride ER 20 MEQ TBCR TAKE 1 TABLET BY MOUTH ONCE A DAY WITH FOOD 12/03/20 12/03/21  Donald Prose, MD  potassium chloride SA (KLOR-CON) 20 MEQ tablet TAKE 1 TABLET BY MOUTH ONCE DAILY 03/30/20 03/30/21  [name] T, PA-C  rosuvastatin (CRESTOR) 10 MG tablet Take 10 mg by mouth at bedtime.     [provider]  rosuvastatin (CRESTOR) 10 MG tablet TAKE 1 TABLET BY MOUTH ONCE A DAY 12/03/20 12/03/21  Donald Prose, MD    Allergies    Lipitor [atorvastatin calcium] and Spironolactone  Review of Systems   Review of Systems  Constitutional: Positive for fatigue.  Respiratory: Positive  for cough.   Neurological: Positive for headaches.  All other systems reviewed and are negative.   Physical Exam Updated Vital Signs BP (!) 183/87 (BP Location: Left Arm)   Pulse 66   Temp 98.9 F (37.2 C)   Resp 18   SpO2 99%   Physical Exam Vitals and nursing note reviewed.  Constitutional:      General: She is not in acute distress.    Appearance: She is well-developed.     Comments: NAD.  HENT:     Head: Normocephalic and atraumatic.     Right Ear: External ear normal.     Left Ear: External ear normal.     Nose: Nose normal.  Eyes:     General: No scleral icterus.    Conjunctiva/sclera: Conjunctivae normal.  Cardiovascular:     Rate and Rhythm: Normal rate and regular rhythm.     Heart sounds: Normal heart sounds. No murmur heard.   Pulmonary:     Effort: Pulmonary effort is normal.     Breath sounds: Normal breath sounds. No wheezing.  Musculoskeletal:        General: No deformity. Normal range of motion.     Cervical back: Normal range of motion and neck supple.  Skin:    General: Skin is warm and dry.     Capillary Refill: Capillary refill takes less than 2 seconds.  Neurological:     Mental Status: She is alert and oriented to person, place, and time.  Psychiatric:        Behavior: Behavior normal.        Thought Content: Thought content normal.        Judgment: Judgment normal.     ED Results / Procedures / Treatments   Labs (all labs ordered are listed, but only abnormal results are displayed) Labs Reviewed - No data to display  EKG None  Radiology No results found.  Procedures Procedures   Medications Ordered in ED Medications - No data to display  ED Course  I have reviewed the triage vital signs and the nursing notes.  Pertinent labs & imaging results that were available during my care of the patient were reviewed by me and considered in my medical decision making (see chart for details).    MDM Rules/Calculators/A&P                           65 yo F with positive COVID test on 6/2 presents to ED wanting guidance about isolation and quarantine guidelines.  She is supposed to babysit her grandchild tomorrow.  Reports improvement in COVID symptoms generally. No red flags like CP SOB fever, worsening symptoms.  She states she doesn't want labs or  testing today.  Vitals reassuring. Exam benign. Given improvement in symptoms I don't think emergent imaging or labs are indicated.  She is comfortable with deferring work up.  Provided education on COVID illness, isolation and OTC treatment of symptoms.  Instructed to isolate/quarantine until 6/7. She verbalized understanding. Appropriate for discharge.   Final Clinical Impression(s) / ED Diagnoses Final diagnoses:  Educated about COVID-19 virus infection    Rx / DC Orders ED Discharge Orders    None       Arlean Hopping 05/29/21 [place], [place], MD 05/30/21 [phone]

## 2021-06-02 ENCOUNTER — Encounter (HOSPITAL_COMMUNITY): Payer: Self-pay

## 2021-06-02 ENCOUNTER — Emergency Department (HOSPITAL_COMMUNITY)
Admission: EM | Admit: 2021-06-02 | Discharge: 2021-06-02 | Disposition: A | Discharge status: Home / Self Care | Payer: Medicaid Other | Attending: Emergency Medicine | Admitting: Emergency Medicine

## 2021-06-02 ENCOUNTER — Other Ambulatory Visit: Payer: Self-pay

## 2021-06-02 DIAGNOSIS — Z7901 Long term (current) use of anticoagulants: Secondary | ICD-10-CM | POA: Diagnosis not present

## 2021-06-02 DIAGNOSIS — I119 Hypertensive heart disease without heart failure: Secondary | ICD-10-CM | POA: Insufficient documentation

## 2021-06-02 DIAGNOSIS — U071 COVID-19: Secondary | ICD-10-CM | POA: Diagnosis present

## 2021-06-02 DIAGNOSIS — Z79899 Other long term (current) drug therapy: Secondary | ICD-10-CM | POA: Diagnosis not present

## 2021-06-02 LAB — SARS CORONAVIRUS 2 (TAT 6-24 HRS): SARS Coronavirus 2: POSITIVE — AB

## 2021-06-02 NOTE — ED Provider Notes (Signed)
[facility] EMERGENCY DEPARTMENT Provider Note   CSN: [id number] Arrival date & time: 06/02/21  1630     History Chief Complaint  Patient presents with   Covid Positive    Theta Jacqueline Petersen is a 65 y.o. female here for evaluation of COVID test.  Was positive 1-week ago.  She is desiring retesting.  No fever, chills headache, syncope, chest pain, shortness of breath, abdominal pain, diarrhea, dysuria.  Denies additional aggravating or alleviating factors.  She is anticoagulated for aFIB.  History obtained from patient AND past medical records.  No interpretor was used,  HPI     Past Medical History:  Diagnosis Date   A-fib ([place])    Acid reflux    Arthritis    Dyslipidemia    Fatigue    GERD (gastroesophageal reflux disease)    History of hysterectomy    Hx of echocardiogram    Echocardiogram (12/15): EF 55-60%, normal wall motion, PASP 32 mmHg   Hyperlipidemia    Hypertension    Hypokalemia    Obesity    Personal history of hypertensive heart disease    Personal history of long-term (current) use of anticoagulants    Shingles     Patient Active Problem List   Diagnosis Date Noted   Obstructive sleep apnea 07/17/2017   Morbid obesity due to excess calories ([place]) 07/17/2017   PVC's (premature ventricular contractions) 01/26/2016   Long term (current) use of anticoagulants 12/09/2013   Atrial fibrillation with RVR ([place]) 12/03/2013   Hyperlipidemia 06/12/2007   Essential hypertension 06/12/2007   ATRIAL FIBRILLATION 06/12/2007   GERD 06/12/2007    Past Surgical History:  Procedure Laterality Date   ABDOMINAL HYSTERECTOMY     CARDIAC CATHETERIZATION  04/15/2007   Est. EF of 50-55% -- Smooth and normal coronary arteries -- Normal left ventricular systolic function.  We will continue with further treatment of her atrial fibrillation      PARTIAL HYSTERECTOMY       OB History   No obstetric history on file.     Family History   Problem Relation Age of Onset   Coronary artery disease Other    Hypertension Other    Heart failure Cousin    Hypertension Son    Heart attack Neg Hx    Stroke Neg Hx     Social History   Tobacco Use   Smoking status: Never   Smokeless tobacco: Never  Substance Use Topics   Alcohol use: No   Drug use: No    Home Medications Prior to Admission medications   Medication Sig Start Date End Date Taking? Authorizing Provider  apixaban (ELIQUIS) 5 MG TABS tablet TAKE 1 TABLET BY MOUTH 2 TIMES DAILY (DISCONTINUE SAVAYSA) 12/22/20 12/22/21  Loel Dubonnet, NP  Cholecalciferol (VITAMIN D) 2000 UNITS CAPS Take 2,000 Units by mouth every evening.    [provider]  FERREX 150 150 MG capsule Take 150 mg by mouth daily. 08/24/20   [provider]  hydrochlorothiazide (MICROZIDE) 12.5 MG capsule Take 1 capsule (12.5 mg total) by mouth daily. 05/03/21 05/03/22  Nahser, Wonda Cheng, MD  iron polysaccharides (NIFEREX) 150 MG capsule TAKE 1 CAPSULE BY MOUTH ONCE A DAY 06/04/20 06/04/21  Harlan Stains, MD  iron polysaccharides (NIFEREX) 150 MG capsule Take 1 capsule by mouth once a day 05/03/21     metoprolol succinate (TOPROL-XL) 100 MG 24 hr tablet TAKE 1 TABLET BY MOUTH AT BEDTIME 11/23/20 11/23/21  Nahser, Wonda Cheng, MD  Potassium Chloride ER 20 MEQ TBCR TAKE 1 TABLET BY MOUTH ONCE A DAY WITH FOOD 12/03/20 12/03/21  Donald Prose, MD  potassium chloride SA (KLOR-CON) 20 MEQ tablet TAKE 1 TABLET BY MOUTH ONCE DAILY 03/30/20 03/30/21  [name] T, PA-C  rosuvastatin (CRESTOR) 10 MG tablet Take 10 mg by mouth at bedtime.     [provider]  rosuvastatin (CRESTOR) 10 MG tablet TAKE 1 TABLET BY MOUTH ONCE A DAY 12/03/20 12/03/21  Donald Prose, MD    Allergies    Lipitor [atorvastatin calcium] and Spironolactone  Review of Systems   Review of Systems  Constitutional: Negative.   HENT: Negative.    Respiratory: Negative.    Cardiovascular: Negative.   Gastrointestinal:  Negative.   Genitourinary: Negative.   Musculoskeletal: Negative.   Skin: Negative.   Neurological: Negative.   All other systems reviewed and are negative.  Physical Exam Updated Vital Signs BP (!) 173/88   Pulse 75   Temp 98.6 F (37 C) (Oral)   Resp 17   Ht 5\' 3"  (1.6 m)   Wt 118 kg   SpO2 99%   BMI 46.08 kg/m   Physical Exam Vitals and nursing note reviewed.  Constitutional:      General: She is not in acute distress.    Appearance: She is well-developed. She is obese. She is not ill-appearing, toxic-appearing or diaphoretic.  HENT:     Head: Normocephalic and atraumatic.     Nose: Nose normal.     Mouth/Throat:     Mouth: Mucous membranes are moist.  Eyes:     Pupils: Pupils are equal, round, and reactive to light.  Cardiovascular:     Rate and Rhythm: Normal rate.     Pulses: Normal pulses.     Heart sounds: Normal heart sounds.  Pulmonary:     Effort: Pulmonary effort is normal. No respiratory distress.     Breath sounds: Normal breath sounds.     Comments: Clear to auscultation bilaterally.  Speaks in full sentences without difficulty Abdominal:     General: Bowel sounds are normal. There is no distension.     Palpations: Abdomen is soft.  Musculoskeletal:        General: No swelling or tenderness. Normal range of motion.     Cervical back: Normal range of motion.     Right lower leg: No edema.     Left lower leg: No edema.  Skin:    General: Skin is warm and dry.  Neurological:     General: No focal deficit present.     Mental Status: She is alert and oriented to person, place, and time.    ED Results / Procedures / Treatments   Labs (all labs ordered are listed, but only abnormal results are displayed) Labs Reviewed  SARS CORONAVIRUS 2 (TAT 6-24 HRS)    EKG None  Radiology No results found.  Procedures Procedures   Medications Ordered in ED Medications - No data to display  ED Course  I have reviewed the triage vital signs and the  nursing notes.  Pertinent labs & imaging results that were available during my care of the patient were reviewed by me and considered in my medical decision making (see chart for details).  65 year old here for evaluation desiring COVID retesting.  Tested positive 1-week ago.  She is asymptomatic.  She is afebrile, nonseptic, not ill appearing. Tolerating PO intake at home. Heart and lungs clear. Abd soft. Non focal neuro exam without  deficits.  Will obtain outpatient COVID testing.  Discussed with patient that some people remain positive for months after initial diagnosis does not necessarily mean infectious. She will follow-up with results on MyChart and FU with PCP. Will return if she develops any concerning symptoms    MDM Rules/Calculators/A&P                           Final Clinical Impression(s) / ED Diagnoses Final diagnoses:  COVID    Rx / DC Orders ED Discharge Orders     None        Thurman Sarver A, PA-C 06/02/21 1645    Dorie Rank, MD 06/02/21 2327

## 2021-06-02 NOTE — ED Triage Notes (Signed)
Pt arrives for covid retesting. States husband and her have been positive and wants to "see if she is negative"

## 2021-06-02 NOTE — Discharge Instructions (Addendum)
COVID test was obtained.  If positive still does not mean infectious people can test positive for up to 3 moths after initial diagnosis.

## 2021-06-03 ENCOUNTER — Other Ambulatory Visit (HOSPITAL_COMMUNITY): Payer: Self-pay

## 2021-06-03 ENCOUNTER — Telehealth: Payer: Self-pay | Admitting: Cardiovascular Disease

## 2021-06-03 MED ORDER — HYDROCHLOROTHIAZIDE 25 MG PO TABS
25.0000 mg | ORAL_TABLET | Freq: Every day | ORAL | 3 refills | Status: DC
  Filled 2021-06-03 – 2021-06-25 (×2): qty 90, 90d supply, fill #0
  Filled 2021-09-23: qty 90, 90d supply, fill #1
  Filled 2021-12-26 – 2022-01-19 (×2): qty 90, 90d supply, fill #2
  Filled 2022-04-24: qty 90, 90d supply, fill #3

## 2021-06-03 NOTE — Telephone Encounter (Signed)
Pt c/o BP issue: STAT if pt c/o blurred vision, one-sided weakness or slurred speech  1. What are your last 5 BP readings?  170-190 over 99-100  2. Are you having any other symptoms (ex. Dizziness, headache, blurred vision, passed out)? Headache, Dizziness  3. What is your BP issue? Patient's phone disconnected during her last call. Patient called back to provide more information   STAT if patient feels like he/she is going to faint   Are you dizzy now? no  Do you feel faint or have you passed out? Not now. Patient felt dizzy yesterday morning   Do you have any other symptoms? no  Have you checked your HR and BP (record if available)? 180/99 HR 59

## 2021-06-03 NOTE — Telephone Encounter (Signed)
Pt c/o BP issue: STAT if pt c/o blurred vision, one-sided weakness or slurred speech  1. What are your last 5 BP readings?  170-190 over 99-100  2. Are you having any other symptoms (ex. Dizziness, headache, blurred vision, passed out)? N/A  3. What is your BP issue? N/A  Pt called reporting hypertension call went silent. Tried to callback went straight to VM.

## 2021-06-03 NOTE — Telephone Encounter (Signed)
Patient was returning call for results 

## 2021-06-03 NOTE — Telephone Encounter (Signed)
Spoke with the patient and advised her to increase HCTZ to 25 mg daily and watch her sodium intake. Patient verbalized understanding.

## 2021-06-03 NOTE — Telephone Encounter (Signed)
Left message for patient with advisement from Dr. Acie Fredrickson to increase HCTZ to 25 mg per day and reduce intake of sodium. Advised patient to call back with any questions.

## 2021-06-03 NOTE — Telephone Encounter (Signed)
Spoke with the patient who states that she was diagnosed with COVID last Friday 6/3. She states that ever since then her BP has been elevated. She reports that this morning BP was 180/99. She states that BP keeps fluctuating. She confirms that she is taking Toprol XL 100 mg daily and HCTZ 12.5 mg daily. She states that she is not having any symptoms. Last week she was very fatigued and had a headache but is now feeling much better. She reports some dizziness yesterday.  She took her BP while on the phone with me and reports 168/84 with heart rate 68. Patient advised to continue medications and monitoring of BP. Will route to Dr. Acie Fredrickson for further advisement.

## 2021-06-13 ENCOUNTER — Other Ambulatory Visit (HOSPITAL_COMMUNITY): Payer: Self-pay

## 2021-06-13 MED ORDER — POTASSIUM CHLORIDE ER 20 MEQ PO TBCR
20.0000 meq | EXTENDED_RELEASE_TABLET | Freq: Every day | ORAL | 1 refills | Status: DC
  Filled 2021-06-13 – 2021-06-25 (×2): qty 90, 90d supply, fill #0
  Filled 2021-09-23: qty 90, 90d supply, fill #1

## 2021-06-13 MED ORDER — ROSUVASTATIN CALCIUM 10 MG PO TABS
10.0000 mg | ORAL_TABLET | Freq: Every day | ORAL | 1 refills | Status: DC
  Filled 2021-06-13 – 2021-06-25 (×2): qty 90, 90d supply, fill #0
  Filled 2021-09-23: qty 90, 90d supply, fill #1

## 2021-06-13 MED ORDER — METOPROLOL SUCCINATE ER 100 MG PO TB24
100.0000 mg | ORAL_TABLET | Freq: Every day | ORAL | 1 refills | Status: DC
  Filled 2021-06-13 – 2021-11-24 (×2): qty 90, 90d supply, fill #0
  Filled 2022-02-23: qty 90, 90d supply, fill #1

## 2021-06-17 ENCOUNTER — Other Ambulatory Visit (HOSPITAL_COMMUNITY): Payer: Self-pay

## 2021-06-17 MED FILL — Apixaban Tab 5 MG: ORAL | 30 days supply | Qty: 60 | Fill #1 | Status: AC

## 2021-06-18 ENCOUNTER — Other Ambulatory Visit (HOSPITAL_COMMUNITY): Payer: Self-pay

## 2021-06-25 ENCOUNTER — Other Ambulatory Visit (HOSPITAL_COMMUNITY): Payer: Self-pay

## 2021-06-28 ENCOUNTER — Other Ambulatory Visit (HOSPITAL_COMMUNITY): Payer: Self-pay

## 2021-06-29 ENCOUNTER — Other Ambulatory Visit (HOSPITAL_COMMUNITY): Payer: Self-pay

## 2021-07-02 ENCOUNTER — Other Ambulatory Visit (HOSPITAL_COMMUNITY): Payer: Self-pay

## 2021-07-21 ENCOUNTER — Other Ambulatory Visit (HOSPITAL_COMMUNITY): Payer: Self-pay

## 2021-07-21 MED FILL — Apixaban Tab 5 MG: ORAL | 30 days supply | Qty: 60 | Fill #2 | Status: AC

## 2021-07-25 ENCOUNTER — Other Ambulatory Visit (HOSPITAL_COMMUNITY): Payer: Self-pay

## 2021-08-01 ENCOUNTER — Other Ambulatory Visit (HOSPITAL_COMMUNITY): Payer: Self-pay

## 2021-08-24 ENCOUNTER — Other Ambulatory Visit (HOSPITAL_COMMUNITY): Payer: Self-pay

## 2021-08-24 MED ORDER — POLYSACCHARIDE IRON COMPLEX 150 MG PO CAPS
150.0000 mg | ORAL_CAPSULE | Freq: Every day | ORAL | 1 refills | Status: DC
  Filled 2021-08-24: qty 90, 90d supply, fill #0
  Filled 2021-11-24: qty 90, 90d supply, fill #1

## 2021-08-24 MED FILL — Apixaban Tab 5 MG: ORAL | 30 days supply | Qty: 60 | Fill #3 | Status: AC

## 2021-08-24 MED FILL — Metoprolol Succinate Tab ER 24HR 100 MG (Tartrate Equiv): ORAL | 90 days supply | Qty: 90 | Fill #1 | Status: AC

## 2021-08-25 ENCOUNTER — Other Ambulatory Visit (HOSPITAL_COMMUNITY): Payer: Self-pay

## 2021-09-23 ENCOUNTER — Other Ambulatory Visit (HOSPITAL_COMMUNITY): Payer: Self-pay

## 2021-09-23 MED FILL — Apixaban Tab 5 MG: ORAL | 30 days supply | Qty: 60 | Fill #4 | Status: AC

## 2021-09-24 ENCOUNTER — Other Ambulatory Visit (HOSPITAL_COMMUNITY): Payer: Self-pay

## 2021-10-27 ENCOUNTER — Other Ambulatory Visit (HOSPITAL_COMMUNITY): Payer: Self-pay

## 2021-10-27 MED FILL — Apixaban Tab 5 MG: ORAL | 30 days supply | Qty: 60 | Fill #5 | Status: AC

## 2021-11-03 ENCOUNTER — Emergency Department (HOSPITAL_COMMUNITY)
Admission: EM | Admit: 2021-11-03 | Discharge: 2021-11-04 | Disposition: A | Discharge status: Home / Self Care | Payer: Medicaid Other | Attending: Emergency Medicine | Admitting: Emergency Medicine

## 2021-11-03 ENCOUNTER — Encounter (HOSPITAL_COMMUNITY): Payer: Self-pay | Admitting: Emergency Medicine

## 2021-11-03 ENCOUNTER — Other Ambulatory Visit: Payer: Self-pay

## 2021-11-03 DIAGNOSIS — I10 Essential (primary) hypertension: Secondary | ICD-10-CM | POA: Insufficient documentation

## 2021-11-03 DIAGNOSIS — I4891 Unspecified atrial fibrillation: Secondary | ICD-10-CM | POA: Insufficient documentation

## 2021-11-03 DIAGNOSIS — Z7901 Long term (current) use of anticoagulants: Secondary | ICD-10-CM | POA: Insufficient documentation

## 2021-11-03 DIAGNOSIS — H53451 Other localized visual field defect, right eye: Secondary | ICD-10-CM | POA: Diagnosis not present

## 2021-11-03 DIAGNOSIS — Z79899 Other long term (current) drug therapy: Secondary | ICD-10-CM | POA: Insufficient documentation

## 2021-11-03 DIAGNOSIS — H5340 Unspecified visual field defects: Secondary | ICD-10-CM

## 2021-11-03 DIAGNOSIS — H5711 Ocular pain, right eye: Secondary | ICD-10-CM | POA: Diagnosis present

## 2021-11-03 NOTE — ED Provider Notes (Signed)
Emergency Medicine Provider Triage Evaluation Note  Florence Yeung , a 65 y.o. female  was evaluated in triage.  Pt complains of black spots in her vision, right eye only.  Started noticing this tonight.  They seem to move around as she moves, seems to make her somewhat dizzy.  Denies total vision loss.  No focal numbness/weakness.  Has had some occipital headaches recently.  Does not wear glasses or corrective lenses at baseline.  Review of Systems  Positive: Black spots in vision Negative: Numbness, weakness  Physical Exam  BP (!) 185/81   Pulse 69   Temp 98.3 F (36.8 C) (Oral)   Resp 16   SpO2 100%   Gen:   Awake, no distress   Resp:  Normal effort  MSK:   Moves extremities without difficulty  Other:    Medical Decision Making  Medically screening exam initiated at 11:44 PM.  Appropriate orders placed.  Semira Sophee Mckimmy was informed that the remainder of the evaluation will be completed by another provider, this initial triage assessment does not replace that evaluation, and the importance of remaining in the ED until their evaluation is complete.  New black spots in vision in right eye only.  Sounds like possibly floaters.  No focal deficits in triage.  Will check labs, CT head.   Larene Pickett, PA-C 11/03/21 2345    Mesner, Corene Cornea, MD 11/04/21 [phone]

## 2021-11-03 NOTE — ED Triage Notes (Signed)
Patient here with right eye black dots in them.  She states that it started earlier this evening, they move where ever she looks.  It is only in the right eye.  No pain.  She did have dizziness with it earlier in the evening.

## 2021-11-04 ENCOUNTER — Emergency Department (HOSPITAL_COMMUNITY): Payer: Medicaid Other

## 2021-11-04 ENCOUNTER — Encounter (HOSPITAL_COMMUNITY): Payer: Self-pay | Admitting: Emergency Medicine

## 2021-11-04 LAB — CBC WITH DIFFERENTIAL/PLATELET
Abs Immature Granulocytes: 0.02 10*3/uL (ref 0.00–0.07)
Basophils Absolute: 0.1 10*3/uL (ref 0.0–0.1)
Basophils Relative: 1 %
Eosinophils Absolute: 0.2 10*3/uL (ref 0.0–0.5)
Eosinophils Relative: 2 %
HCT: 37.1 % (ref 36.0–46.0)
Hemoglobin: 11.8 g/dL — ABNORMAL LOW (ref 12.0–15.0)
Immature Granulocytes: 0 %
Lymphocytes Relative: 39 %
Lymphs Abs: 2.7 10*3/uL (ref 0.7–4.0)
MCH: 28.6 pg (ref 26.0–34.0)
MCHC: 31.8 g/dL (ref 30.0–36.0)
MCV: 90 fL (ref 80.0–100.0)
Monocytes Absolute: 0.5 10*3/uL (ref 0.1–1.0)
Monocytes Relative: 7 %
Neutro Abs: 3.6 10*3/uL (ref 1.7–7.7)
Neutrophils Relative %: 51 %
Platelets: 346 10*3/uL (ref 150–400)
RBC: 4.12 MIL/uL (ref 3.87–5.11)
RDW: 13.7 % (ref 11.5–15.5)
WBC: 7.1 10*3/uL (ref 4.0–10.5)
nRBC: 0 % (ref 0.0–0.2)

## 2021-11-04 LAB — COMPREHENSIVE METABOLIC PANEL
ALT: 45 U/L — ABNORMAL HIGH (ref 0–44)
AST: 37 U/L (ref 15–41)
Albumin: 3.1 g/dL — ABNORMAL LOW (ref 3.5–5.0)
Alkaline Phosphatase: 61 U/L (ref 38–126)
Anion gap: 7 (ref 5–15)
BUN: 11 mg/dL (ref 8–23)
CO2: 28 mmol/L (ref 22–32)
Calcium: 8.8 mg/dL — ABNORMAL LOW (ref 8.9–10.3)
Chloride: 106 mmol/L (ref 98–111)
Creatinine, Ser: 0.82 mg/dL (ref 0.44–1.00)
GFR, Estimated: >60 mL/min (ref >60)
Glucose, Bld: 114 mg/dL — ABNORMAL HIGH (ref 70–99)
Potassium: 3.2 mmol/L — ABNORMAL LOW (ref 3.5–5.1)
Sodium: 141 mmol/L (ref 135–145)
Total Bilirubin: 0.4 mg/dL (ref 0.3–1.2)
Total Protein: 5.9 g/dL — ABNORMAL LOW (ref 6.5–8.1)

## 2021-11-04 MED ORDER — GADOBUTROL 1 MMOL/ML IV SOLN
10.0000 mL | Freq: Once | INTRAVENOUS | Status: AC | PRN
  Administered 2021-11-04: 10 mL via INTRAVENOUS

## 2021-11-04 MED ORDER — TETRACAINE HCL 0.5 % OP SOLN
2.0000 [drp] | Freq: Once | OPHTHALMIC | Status: AC
  Administered 2021-11-04: 2 [drp] via OPHTHALMIC
  Filled 2021-11-04: qty 4

## 2021-11-04 NOTE — Discharge Instructions (Signed)
Please continue your anticoagulation medicine which is your Eliquis. Please go directly to Dr. Samul Dada office for evaluation. The address is [address]. The phone number is [phone] They are expecting you to come directly there. Please return to the emergency department if you are having new or worsening symptoms.

## 2021-11-04 NOTE — ED Provider Notes (Signed)
[facility] EMERGENCY DEPARTMENT Provider Note   CSN: [id number] Arrival date & time: 11/03/21  2257     History Chief Complaint  Patient presents with   Eye Problem    Jacqueline Petersen is a 65 y.o. female.  HPI 65 year old female history of lipidemia, hypertension, obesity presents today complaining of right upper lateral visual field abnormalities that began last night.  They have remained constant.  She feels like she sees a spiderweb in her right upper visual quadrant.  There is no pain associated.  The same visual deficit remains no matter what she looks at.  It is present only in her right eye.  She has not had anything similar in the past.  She denies any history of abnormal vision, myopia, stroke, but is chronically anticoagulated.  She states she thinks she may have missed a dose of her Eliquis.     Past Medical History:  Diagnosis Date   A-fib ([place])    Acid reflux    Arthritis    Dyslipidemia    Fatigue    GERD (gastroesophageal reflux disease)    History of hysterectomy    Hx of echocardiogram    Echocardiogram (12/15): EF 55-60%, normal wall motion, PASP 32 mmHg   Hyperlipidemia    Hypertension    Hypokalemia    Obesity    Personal history of hypertensive heart disease    Personal history of long-term (current) use of anticoagulants    Shingles     Patient Active Problem List   Diagnosis Date Noted   Obstructive sleep apnea 07/17/2017   Morbid obesity due to excess calories ([place]) 07/17/2017   PVC's (premature ventricular contractions) 01/26/2016   Long term (current) use of anticoagulants 12/09/2013   Atrial fibrillation with RVR ([place]) 12/03/2013   Hyperlipidemia 06/12/2007   Essential hypertension 06/12/2007   ATRIAL FIBRILLATION 06/12/2007   GERD 06/12/2007    Past Surgical History:  Procedure Laterality Date   ABDOMINAL HYSTERECTOMY     CARDIAC CATHETERIZATION  04/15/2007   Est. EF of 50-55% -- Smooth and normal  coronary arteries -- Normal left ventricular systolic function.  We will continue with further treatment of her atrial fibrillation      PARTIAL HYSTERECTOMY       OB History   No obstetric history on file.     Family History  Problem Relation Age of Onset   Coronary artery disease Other    Hypertension Other    Heart failure Cousin    Hypertension Son    Heart attack Neg Hx    Stroke Neg Hx     Social History   Tobacco Use   Smoking status: Never   Smokeless tobacco: Never  Substance Use Topics   Alcohol use: No   Drug use: No    Home Medications Prior to Admission medications   Medication Sig Start Date End Date Taking? Authorizing Provider  apixaban (ELIQUIS) 5 MG TABS tablet TAKE 1 TABLET BY MOUTH 2 TIMES DAILY (DISCONTINUE SAVAYSA) 12/22/20 12/22/21  Loel Dubonnet, NP  Cholecalciferol (VITAMIN D) 2000 UNITS CAPS Take 2,000 Units by mouth every evening.    [provider]  FERREX 150 150 MG capsule Take 150 mg by mouth daily. 08/24/20   [provider]  hydrochlorothiazide (HYDRODIURIL) 25 MG tablet Take 1 tablet (25 mg total) by mouth daily. 06/03/21   Nahser, Wonda Cheng, MD  iron polysaccharides (FERREX 150) 150 MG capsule Take 1 capsule by mouth once a day  08/24/21     iron polysaccharides (NIFEREX) 150 MG capsule TAKE 1 CAPSULE BY MOUTH ONCE A DAY 06/04/20 06/04/21  Harlan Stains, MD  metoprolol succinate (TOPROL-XL) 100 MG 24 hr tablet TAKE 1 TABLET BY MOUTH AT BEDTIME 11/23/20 11/23/21  Nahser, Wonda Cheng, MD  metoprolol succinate (TOPROL-XL) 100 MG 24 hr tablet Take 1 tablet (100 mg total) by mouth daily. 06/13/21     Potassium Chloride ER 20 MEQ TBCR Take 1 tablet (20 mEq) by mouth daily with food 06/13/21     potassium chloride SA (KLOR-CON) 20 MEQ tablet TAKE 1 TABLET BY MOUTH ONCE DAILY 03/30/20 03/30/21  [name] T, PA-C  rosuvastatin (CRESTOR) 10 MG tablet Take 10 mg by mouth at bedtime.     [provider]  rosuvastatin (CRESTOR) 10  MG tablet TAKE 1 TABLET BY MOUTH ONCE A DAY 12/03/20 12/03/21  Donald Prose, MD  rosuvastatin (CRESTOR) 10 MG tablet Take 1 tablet (10 mg total) by mouth daily. 06/13/21       Allergies    Lipitor [atorvastatin calcium] and Spironolactone  Review of Systems   Review of Systems  All other systems reviewed and are negative.  Physical Exam Updated Vital Signs BP (!) 155/98   Pulse 60   Temp 98.1 F (36.7 C)   Resp (!) 25   SpO2 99%   Physical Exam Vitals and nursing note reviewed.  Constitutional:      Appearance: Normal appearance.  HENT:     Head: Normocephalic.     Right Ear: External ear normal.     Left Ear: External ear normal.     Nose: Nose normal.     Mouth/Throat:     Pharynx: Oropharynx is clear.  Eyes:     General: Lids are normal.     Extraocular Movements: Extraocular movements intact.     Conjunctiva/sclera: Conjunctivae normal.     Pupils: Pupils are equal, round, and reactive to light.     Visual Fields: Left eye visual fields normal.     Right eye: CF in the upper temporal quadrant.     Comments: No abnormalities noted on limited funduscopic exam Intraocular pressure 24 right eye and 26 in left eye  Cardiovascular:     Rate and Rhythm: Normal rate and regular rhythm.  Pulmonary:     Effort: Pulmonary effort is normal.     Breath sounds: Normal breath sounds.  Abdominal:     Palpations: Abdomen is soft.  Musculoskeletal:        General: Normal range of motion.     Cervical back: Normal range of motion.  Skin:    General: Skin is warm.     Capillary Refill: Capillary refill takes less than 2 seconds.  Neurological:     General: No focal deficit present.     Mental Status: She is alert.  Psychiatric:        Mood and Affect: Mood normal.    ED Results / Procedures / Treatments   Labs (all labs ordered are listed, but only abnormal results are displayed) Labs Reviewed  CBC WITH DIFFERENTIAL/PLATELET - Abnormal; Notable for the following  components:      Result Value   Hemoglobin 11.8 (*)    All other components within normal limits  COMPREHENSIVE METABOLIC PANEL - Abnormal; Notable for the following components:   Potassium 3.2 (*)    Glucose, Bld 114 (*)    Calcium 8.8 (*)    Total Protein 5.9 (*)  Albumin 3.1 (*)    ALT 45 (*)    All other components within normal limits    EKG EKG Interpretation  Date/Time:  Friday November 04 2021 10:10:53 EST Ventricular Rate:  63 PR Interval:  146 QRS Duration: 93 QT Interval:  432 QTC Calculation: 443 R Axis:   -26 Text Interpretation: Sinus rhythm Abnormal R-wave progression, late transition Left ventricular hypertrophy Borderline T abnormalities, diffuse leads Confirmed by Pattricia Boss [phone]) on 11/04/2021 12:16:14 PM  Radiology CT HEAD WO CONTRAST (5MM)  Result Date: 11/04/2021 CLINICAL DATA:  Vision loss.  Dizziness. EXAM: CT HEAD WITHOUT CONTRAST TECHNIQUE: Contiguous axial images were obtained from the base of the skull through the vertex without intravenous contrast. COMPARISON:  Head CT dated 03/12/2021. FINDINGS: Brain: The ventricles and sulci appropriate size for patient's age. The gray-white matter discrimination is preserved. There is no acute intracranial hemorrhage. No mass effect midline shift. No extra-axial fluid collection. Vascular: No hyperdense vessel or unexpected calcification. Skull: Normal. Negative for fracture or focal lesion. Sinuses/Orbits: No acute finding. There is apparent 7 x 9 mm nodular density in the posterior left orbit along the ocular musculature (5/4 and 37/6). This is not characterized on this CT but may represent a venous varix. Further evaluation with MRI without and with contrast on a nonemergent/outpatient basis recommended. Other: None IMPRESSION: 1. Unremarkable noncontrast CT of the brain. 2. 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 Further characterization with MRI on a  nonemergent/outpatient basis recommended Electronically Signed   By: Anner Crete M.D.   On: 11/04/2021 02:32   MR BRAIN W WO CONTRAST  Result Date: 11/04/2021 CLINICAL DATA:  Neuro deficit, acute, stroke suspected; right eye monocular right upper quadrant visual changes. EXAM: MRI HEAD AND ORBITS WITHOUT AND WITH CONTRAST TECHNIQUE: Multiplanar, multiecho pulse sequences of the brain and surrounding structures were obtained without and with intravenous contrast. Multiplanar, multiecho pulse sequences of the orbits and surrounding structures were obtained including fat saturation techniques, before and after intravenous contrast administration. CONTRAST:  55mL GADAVIST GADOBUTROL 1 MMOL/ML IV SOLN COMPARISON:  Prior head CT examinations 11/04/2021 and earlier. Brain MRI 05/11/2010. FINDINGS: MRI HEAD FINDINGS Brain: Mild intermittent motion degradation. Cerebral volume is normal. Small chronic lacunar infarcts within the bilateral corona radiata. Background mild multifocal T2 FLAIR hyperintense signal abnormality within the cerebral white matter, nonspecific but compatible with chronic small vessel ischemic disease. There is no acute infarct. No evidence of an intracranial mass. No chronic intracranial blood products. No extra-axial fluid collection. No midline shift. No pathologic intracranial enhancement identified. Vascular: Maintained flow voids within the proximal large arterial vessels. Skull and upper cervical spine: No focal suspicious marrow lesion. Incompletely assessed cervical spondylosis. Other: 2.5 x 0.4 cm left frontal scalp lipoma. MRI ORBITS FINDINGS Orbits: Within the intraconal and extraconal inferolateral left orbit, interposed between the lateral and inferior rectus muscles, there is a 1.5 x 1.0 x 0.9 cm lesion with an apparent internal blood-fluid level (series 10, image 10) (series 16, image 13) (series 14, image 16). The lesion is avidly enhancing. The lesion extends posteriorly to  the orbital apex. The globes are normal in size and contour. The extraocular muscles and optic nerve sheath complexes are symmetric and unremarkable. No orbital mass or abnormal intraorbital enhancement on the right. Visualized sinuses: Mild mucosal thickening within the bilateral ethmoid air cells. Small mucous retention cyst within the right maxillary sinus. Soft tissues: The visualized facial soft tissues are unremarkable. IMPRESSION:  MRI brain: 1. Mildly motion degraded exam. 2. No evidence of acute intracranial abnormality. 3. Small chronic lacunar infarcts within the bilateral corona radiata, new from the prior brain MRI of 05/11/2010. 4. Background mild cerebral white matter chronic small vessel ischemic changes, also new from the prior MRI. MRI orbits: 1. 1.5 x 1.0 cm avidly enhancing lesion within the intraconal and extraconal inferolateral left orbit, as described. 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 2. Otherwise unremarkable MRI of the orbits. 3. Mild paranasal sinus disease, as described. Electronically Signed   By: Kellie Simmering D.O.   On: 11/04/2021 12:13   MR ORBITS W WO CONTRAST  Result Date: 11/04/2021 CLINICAL DATA:  Neuro deficit, acute, stroke suspected; right eye monocular right upper quadrant visual changes. EXAM: MRI HEAD AND ORBITS WITHOUT AND WITH CONTRAST TECHNIQUE: Multiplanar, multiecho pulse sequences of the brain and surrounding structures were obtained without and with intravenous contrast. Multiplanar, multiecho pulse sequences of the orbits and surrounding structures were obtained including fat saturation techniques, before and after intravenous contrast administration. CONTRAST:  29mL GADAVIST GADOBUTROL 1 MMOL/ML IV SOLN COMPARISON:  Prior head CT examinations 11/04/2021 and earlier. Brain MRI 05/11/2010. FINDINGS: MRI HEAD FINDINGS Brain: Mild intermittent motion degradation. Cerebral volume is normal. Small  chronic lacunar infarcts within the bilateral corona radiata. Background mild multifocal T2 FLAIR hyperintense signal abnormality within the cerebral white matter, nonspecific but compatible with chronic small vessel ischemic disease. There is no acute infarct. No evidence of an intracranial mass. No chronic intracranial blood products. No extra-axial fluid collection. No midline shift. No pathologic intracranial enhancement identified. Vascular: Maintained flow voids within the proximal large arterial vessels. Skull and upper cervical spine: No focal suspicious marrow lesion. Incompletely assessed cervical spondylosis. Other: 2.5 x 0.4 cm left frontal scalp lipoma. MRI ORBITS FINDINGS Orbits: Within the intraconal and extraconal inferolateral left orbit, interposed between the lateral and inferior rectus muscles, there is a 1.5 x 1.0 x 0.9 cm lesion with an apparent internal blood-fluid level (series 10, image 10) (series 16, image 13) (series 14, image 16). The lesion is avidly enhancing. The lesion extends posteriorly to the orbital apex. The globes are normal in size and contour. The extraocular muscles and optic nerve sheath complexes are symmetric and unremarkable. No orbital mass or abnormal intraorbital enhancement on the right. Visualized sinuses: Mild mucosal thickening within the bilateral ethmoid air cells. Small mucous retention cyst within the right maxillary sinus. Soft tissues: The visualized facial soft tissues are unremarkable. IMPRESSION: MRI brain: 1. Mildly motion degraded exam. 2. No evidence of acute intracranial abnormality. 3. Small chronic lacunar infarcts within the bilateral corona radiata, new from the prior brain MRI of 05/11/2010. 4. Background mild cerebral white matter chronic small vessel ischemic changes, also new from the prior MRI. MRI orbits: 1. 1.5 x 1.0 cm avidly enhancing lesion within the intraconal and extraconal inferolateral left orbit, as described. 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 2. Otherwise unremarkable MRI of the orbits. 3. Mild paranasal sinus disease, as described. Electronically Signed   By: Kellie Simmering D.O.   On: 11/04/2021 12:13    Procedures Procedures   Medications Ordered in ED Medications  tetracaine (PONTOCAINE) 0.5 % ophthalmic solution 2 drop (has no administration in time range)  gadobutrol (GADAVIST) 1 MMOL/ML injection 10 mL (10 mLs Intravenous Contrast Given 11/04/21 1137)    ED Course  I have reviewed the triage  vital signs and the nursing notes.  Pertinent labs & imaging results that were available during my care of the patient were reviewed by me and considered in my medical decision making (see chart for details).    MDM Rules/Calculators/A&P                          65 year old female history of A. fib on chronic anticoagulation presents today with visual field deficit that she describes as looking like spiders and spider webs in her right eye in the upper outer quadrant.  My exam here revealed grossly normal exam.  She had a bedside point-of-care ultrasound obtained that did not show any evidence on my exam of retinal detachment.  MRI obtained showed small chronic lacunar infarcts with no acute abnormality on the brain.  Her orbits showed an enhancing lesion in the left eye, most consistent with orbital venous abnormality per radiologist.  This is not consistent with her deficit.  Intraocular pressures were measured and were 21 in the right eye and 26 in the left eye EMERGENCY DEPARTMENT [place] OCULAR EXAM "Study: Limited Ultrasound of Orbit "  INDICATIONS: Floaters/Flashes Linear probe utilized to obtain images in both long and short axis of the orbit having the patient look left and right if possible.  PERFORMED BY: Myself IMAGES ARCHIVED?: Yes LIMITATIONS: none VIEWS USED: Right orbit INTERPRETATION: No retinal detachment   Discussed care with Dr. Hollice Espy,  on-call for ophthalmology.  MRI does not show evidence of acute stroke.  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  Patient is on Eliquis and is to continue this.  Remainder of labs and work-up appear to be within normal limits and does not appear to have any acute problem that should cause her hospitalization.  She appears appears stable to leave the ED and be assessed by Dr. Hollice Espy in his office with the appropriate equipment to evaluate her retina better.  Patient understands plan and understands that she is to go directly to his office will be seen today.  Final Clinical Impression(s) / ED Diagnoses Final diagnoses:  Visual field defect of right eye    Rx / DC Orders ED Discharge Orders     None        Pattricia Boss, MD 11/04/21 1328

## 2021-11-04 NOTE — ED Notes (Signed)
Patient transported to MRI 

## 2021-11-20 ENCOUNTER — Encounter: Payer: Self-pay | Admitting: Cardiovascular Disease

## 2021-11-20 NOTE — Progress Notes (Signed)
[place], [address], [place]  [address] Phone: [phone] Fax:  [phone]  Date:  11/20/2021   ID:  Virgen, Belland [birth year], MRN [id number]  PCP:  [facility].  Problem list: 1. Hypertension 2. Hyperlipidemia 3. Paroxysmal atrial fibrillation 4. Obesity 5. Obstructive sleep apnea 6. PVCs       Jacqueline Petersen is a 65 y.o. female with a hx of HTN, HL, paroxysmal atrial fibrillation previously on Coumadin, GERD and obesity. LHC (03/2007): Normal coronary arteries, EF 50-55%. Patient was recently admitted 12/10-12/13 after presenting with palpitations. She was found to be in atrial fibrillation with RVR. She converted to NSR on IV diltiazem. It was felt that she needed long-term anticoagulation. CHADS2-VASc=2.  She was placed on Coumadin. Echocardiogram (12/04/2013): Moderate LVH, EF 50-55%, normal wall motion.  She has generally been doing okay since discharge. She feels fatigued. She's had occasional palpitations. These are fairly consistent with what she has had in the past. She denies any prolonged rapid palpitations. She denies chest pain, dyspnea, syncope, orthopnea, PND or edema. She is compliant with her CPAP.   March 12, 2014: Jacqueline Petersen has a hx of AF in the remote past.   She has normal coronaries.  She was admitted with Afib- and converted with dilt drip.  She continues to have problems with HTN.  She complains about pain and firmness in her left leg.  She has continued to gain weight He has a history of sleep apnea. She does not look like wearing her CPAP mask because it is uncomfortable at night.  Sept. 16, 2015:  Pt is doing well.  I have met her in the past.  She was seen by [name] in March for follow up for her a-fib.  Still eating salt.- diastolic BP is still elevated.  Unable to exercise due to leg pain  Nov. 24, 2015:  Mr. Beldin is seen back today for follow up of her HTN. We added Amlodipine at  her last visit.  She has not had any further episodes of atrial fib. She has lots of pain in her knees.  Is scheduled to see an orthopedist soon.  Feb. 1, 2017:  She has  Seen Dr. Caryl Comes since I last seen her. She has significant number of premature ventricular contractions. A 24-hour Holter revealed 11% PVC burden. These have greatly improved when she limited caffeine from her diet.  Signal average ECG was normal   Repeat echocardiogram reveals normal left ventricular systolic function. She is doing well.   No dyspnea, no CP  Her CPAP is not working.    She needs to have right knee replacement .     Sept. 6, 2017:  Doing well.  No further palpitations  - better since she is avoiding caffiene.  March 05, 2017:  Jacqueline Petersen is seen today. Feeling sluggish. Using her CPAP - fairly well 4 hrs at night   Dec. 17, 2019 Jacqueline Petersen is seen today for follow-up of her paroxysmal atrial fibrillation, hypertension, hyperlipidemia, PVC  She needs to have a colonoscopy and is here partly for preoperative evaluation prior to having her colonoscopy.  Remains in NSR  Wt is 263 lbs ,  Wt is up 6 lbs since I last saw her )  Exercises daily   Dec. 16, 2020 Jacqueline Petersen  is seen today for follow-up of her hypertension and paroxysmal atrial fibrillation.  Her weight today is 250 pounds which is down 13 pounds from last year.  She admits to eating a few more snacks than she should.  November 23, 2020: Jacqueline Petersen is seen today for follow-up visit.  She has a history of hypertension and paroxysmal atrial fibrillation. Wt. Is 239.  Has occasional palpitations.  She ran out of her metoprolol last night   Nov. 28, 2022 Jacqueline Petersen is seen today for follow up of her HTN, PAF Hx of obesity Wt. Today is 232 lbs  Has floaters in her R eye. No palpitations, no cp or dyspnea  BP is elevated - likely due to more salt over the Thanksgiving weekend     Wt Readings from Last 3 Encounters:  06/02/21 260 lb 2.3 oz  (118 kg)  05/27/21 257 lb 15 oz (117 kg)  04/12/21 260 lb (117.9 kg)     Past Medical History:  Diagnosis Date   A-fib ([facility])    Acid reflux    Arthritis    Dyslipidemia    Fatigue    GERD (gastroesophageal reflux disease)    History of hysterectomy    Hx of echocardiogram    Echocardiogram (12/15): EF 55-60%, normal wall motion, PASP 32 mmHg   Hyperlipidemia    Hypertension    Hypokalemia    Obesity    Personal history of hypertensive heart disease    Personal history of long-term (current) use of anticoagulants    Shingles     Current Outpatient Medications  Medication Sig Dispense Refill   apixaban (ELIQUIS) 5 MG TABS tablet TAKE 1 TABLET BY MOUTH 2 TIMES DAILY (DISCONTINUE SAVAYSA) 60 tablet 11   Cholecalciferol (VITAMIN D) 2000 UNITS CAPS Take 2,000 Units by mouth every evening.     FERREX 150 150 MG capsule Take 150 mg by mouth daily.     hydrochlorothiazide (HYDRODIURIL) 25 MG tablet Take 1 tablet (25 mg total) by mouth daily. 90 tablet 3   iron polysaccharides (FERREX 150) 150 MG capsule Take 1 capsule by mouth once a day 90 capsule 1   iron polysaccharides (NIFEREX) 150 MG capsule TAKE 1 CAPSULE BY MOUTH ONCE A DAY 90 capsule 3   metoprolol succinate (TOPROL-XL) 100 MG 24 hr tablet TAKE 1 TABLET BY MOUTH AT BEDTIME 90 tablet 3   metoprolol succinate (TOPROL-XL) 100 MG 24 hr tablet Take 1 tablet (100 mg total) by mouth daily. 90 tablet 1   Potassium Chloride ER 20 MEQ TBCR Take 1 tablet (20 mEq) by mouth daily with food 90 tablet 1   potassium chloride SA (KLOR-CON) 20 MEQ tablet TAKE 1 TABLET BY MOUTH ONCE DAILY 90 tablet 3   rosuvastatin (CRESTOR) 10 MG tablet Take 10 mg by mouth at bedtime.      rosuvastatin (CRESTOR) 10 MG tablet TAKE 1 TABLET BY MOUTH ONCE A DAY 90 tablet 1   rosuvastatin (CRESTOR) 10 MG tablet Take 1 tablet (10 mg total) by mouth daily. 90 tablet 1   No current facility-administered medications for this visit.    Allergies:   Lipitor  [atorvastatin calcium] and Spironolactone   Social History:  The patient  reports that she has never smoked. She has never used smokeless tobacco. She reports that she does not drink alcohol and does not use drugs.   Family History:  The patient's family history includes Coronary artery disease in an other family member; Heart failure in her cousin; Hypertension in her son and another family member.   ROS:  Please see the history of present illness.   She denies any bleeding problems. She has had  some recent URI symptoms. She denies fever.   All other systems reviewed and negative.   Physical Exam: There were no vitals taken for this visit.  GEN:  moderately obese female,  NAD  HEENT: Normal NECK: No JVD; No carotid bruits LYMPHATICS: No lymphadenopathy CARDIAC: RRR , no murmurs, rubs, gallops RESPIRATORY:  Clear to auscultation without rales, wheezing or rhonchi  ABDOMEN: Soft, non-tender, non-distended MUSCULOSKELETAL:  No edema; No deformity  SKIN: Warm and dry NEUROLOGIC:  Alert and oriented x 3   EKG:       ASSESSMENT AND PLAN:  1. Hypertension -   BP is a bit elevated - likely due to eating more salt than she should or thanksgiving .  Cont current meds. Cont weight loss efforts.    2. Hyperlipidemia -    last lipids look good   3. Paroxysmal atrial fibrillation - no recurrent palpitations .   4. Obesity - Wt is down 7 lbs from last year. Cont weight loss efforts.    5. Obstructive sleep apnea -       [name], MD  11/20/2021 10:29 AM    [facility] [place],  [address], [place]  [address] Pager [phone] Phone: [phone]; Fax: [phone]

## 2021-11-21 ENCOUNTER — Encounter: Payer: Self-pay | Admitting: Cardiovascular Disease

## 2021-11-21 ENCOUNTER — Ambulatory Visit (INDEPENDENT_AMBULATORY_CARE_PROVIDER_SITE_OTHER): Payer: Medicaid Other | Admitting: Cardiovascular Disease

## 2021-11-21 ENCOUNTER — Other Ambulatory Visit: Payer: Self-pay

## 2021-11-21 VITALS — BP 142/84 | HR 66 | Ht 63.0 in | Wt 232.4 lb

## 2021-11-21 DIAGNOSIS — I4891 Unspecified atrial fibrillation: Secondary | ICD-10-CM

## 2021-11-21 DIAGNOSIS — I480 Paroxysmal atrial fibrillation: Secondary | ICD-10-CM

## 2021-11-21 DIAGNOSIS — I10 Essential (primary) hypertension: Secondary | ICD-10-CM | POA: Diagnosis not present

## 2021-11-21 NOTE — Patient Instructions (Signed)
Medication Instructions:  Your physician recommends that you continue on your current medications as directed. Please refer to the Current Medication list given to you today.  *If you need a refill on your cardiac medications before your next appointment, please call your pharmacy*   Lab Work: none If you have labs (blood work) drawn today and your tests are completely normal, you will receive your results only by: [place] (if you have MyChart) OR A paper copy in the mail If you have any lab test that is abnormal or we need to change your treatment, we will call you to review the results.   Testing/Procedures: none   Follow-Up: At [facility], you and your health needs are our priority.  As part of our continuing mission to provide you with exceptional heart care, we have created designated Provider Care Teams.  These Care Teams include your primary Cardiologist (physician) and Advanced Practice Providers (APPs -  Physician Assistants and Nurse Practitioners) who all work together to provide you with the care you need, when you need it.  We recommend signing up for the patient portal called "MyChart".  Sign up information is provided on this After Visit Summary.  MyChart is used to connect with patients for Virtual Visits (Telemedicine).  Patients are able to view lab/test results, encounter notes, upcoming appointments, etc.  Non-urgent messages can be sent to your provider as well.   To learn more about what you can do with MyChart, go to [email or web address].    Your next appointment:   12 month(s)  The format for your next appointment:   In Person  Provider:   [name], MD  or Robbie Lis, PA-C, Melina Copa, PA-C, Cecilie Kicks, NP, Ermalinda Barrios, PA-C, Christen Bame, NP, or [name], PA-C      If MD is not listed, click here to update    :1}    Other Instructions

## 2021-11-24 ENCOUNTER — Other Ambulatory Visit (HOSPITAL_COMMUNITY): Payer: Self-pay

## 2021-11-24 MED FILL — Apixaban Tab 5 MG: ORAL | 30 days supply | Qty: 60 | Fill #6 | Status: AC

## 2021-12-02 ENCOUNTER — Other Ambulatory Visit (HOSPITAL_COMMUNITY): Payer: Self-pay

## 2021-12-26 ENCOUNTER — Other Ambulatory Visit: Payer: Self-pay | Admitting: Family

## 2021-12-26 ENCOUNTER — Other Ambulatory Visit (HOSPITAL_COMMUNITY): Payer: Self-pay

## 2021-12-26 DIAGNOSIS — I480 Paroxysmal atrial fibrillation: Secondary | ICD-10-CM

## 2021-12-27 ENCOUNTER — Other Ambulatory Visit (HOSPITAL_COMMUNITY): Payer: Self-pay

## 2021-12-27 MED ORDER — APIXABAN 5 MG PO TABS
5.0000 mg | ORAL_TABLET | Freq: Two times a day (BID) | ORAL | 1 refills | Status: DC
  Filled 2021-12-27 – 2022-01-19 (×3): qty 180, 90d supply, fill #0
  Filled 2022-04-24: qty 180, 90d supply, fill #1

## 2021-12-27 NOTE — Telephone Encounter (Signed)
Prescription refill request for Eliquis received. Indication: a fib Last office visit: 11/21/21 Scr: 0.82 Age:66 Weight: 105kg

## 2021-12-28 ENCOUNTER — Other Ambulatory Visit (HOSPITAL_COMMUNITY): Payer: Self-pay

## 2021-12-28 MED ORDER — POTASSIUM CHLORIDE ER 20 MEQ PO TBCR
EXTENDED_RELEASE_TABLET | ORAL | 0 refills | Status: DC
  Filled 2021-12-28 – 2022-01-19 (×2): qty 90, 90d supply, fill #0

## 2021-12-28 MED ORDER — ROSUVASTATIN CALCIUM 10 MG PO TABS
10.0000 mg | ORAL_TABLET | Freq: Every day | ORAL | 0 refills | Status: DC
  Filled 2021-12-28 – 2022-01-19 (×2): qty 90, 90d supply, fill #0

## 2021-12-29 ENCOUNTER — Other Ambulatory Visit (HOSPITAL_COMMUNITY): Payer: Self-pay

## 2022-01-02 NOTE — Progress Notes (Signed)
Office Visit Note  Patient: Jacqueline Petersen             Date of Birth: February 15, 1956           MRN: [id number]             PCP: Donald Prose, MD Referring: Antony Contras, MD Visit Date: 01/03/2022  Subjective:  New Patient (Initial Visit) (Total body pain)   History of Present Illness: Jacqueline Petersen is a 66 y.o. female here for evaluation of fatigue and multiple joint pains associated with elevated ESR and CK. She has a known history of bilateral knee osteoarthritis. She was recommended to try stopping statin treatment as a possible contributor.  So far that does not had a significant impact on her symptoms.  She does have osteoarthritis and describes some joint pain and short duration of morning stiffness but a lot of pain is more over muscular areas or generalized sensitivity.  She is not seeing any skin rashes or swelling in specific areas.  Strength remains intact just has more pain and difficulty using the range of motion.  No new skin rashes.  She does become quickly short of breath with exertion but no complaint of new coughs or shortness of breath at rest.  Labs reviewed 10/2021 ESR 41 CK 306  09/2021 ESR 32 CK 299 CBC wnl CMP unremarkable  Activities of Daily Living:  Patient reports morning stiffness for 20 minutes.   Patient Reports nocturnal pain.  Difficulty dressing/grooming: Reports Difficulty climbing stairs: Reports Difficulty getting out of chair: Reports Difficulty using hands for taps, buttons, cutlery, and/or writing: Reports  Review of Systems  Constitutional:  Positive for fatigue.  HENT:  Positive for mouth dryness.   Eyes:  Positive for dryness.  Respiratory:  Positive for shortness of breath.   Cardiovascular:  Positive for swelling in legs/feet.  Gastrointestinal:  Positive for constipation and diarrhea.  Endocrine: Positive for cold intolerance, excessive thirst and increased urination.  Genitourinary:  Negative for difficulty  urinating.  Musculoskeletal:  Positive for joint pain, joint pain, joint swelling, muscle weakness, morning stiffness and muscle tenderness.  Skin:  Positive for rash.  Allergic/Immunologic: Negative for susceptible to infections.  Neurological:  Positive for numbness and weakness.  Hematological:  Negative for bruising/bleeding tendency.  Psychiatric/Behavioral:  Negative for sleep disturbance.     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   Bilateral primary osteoarthritis of knee 01/03/2022   Iron deficiency anemia 01/03/2022   Prediabetes 01/03/2022   Vitamin D deficiency 01/03/2022   Sedimentation rate elevation 01/03/2022   Elevated CK 01/03/2022   Bilateral hand pain 01/03/2022   Other fatigue 01/03/2022   Obstructive sleep apnea 07/17/2017   Morbid obesity due to excess calories ([place]) 07/17/2017   PVC's (premature ventricular contractions) 01/26/2016   Long term (current) use of anticoagulants 12/09/2013   Atrial fibrillation with RVR ([place]) 12/03/2013   Hyperlipidemia 06/12/2007   Essential hypertension 06/12/2007   ATRIAL FIBRILLATION 06/12/2007   GERD 06/12/2007    Past Medical History:  Diagnosis Date   A-fib (HCC)    Acid reflux    Arthritis    Dyslipidemia    Fatigue    GERD (gastroesophageal reflux disease)    History of hysterectomy    Hx of echocardiogram    Echocardiogram (12/15): EF 55-60%, normal wall motion, PASP 32 mmHg   Hyperlipidemia  Hypertension    Hypokalemia    Obesity    Personal history of hypertensive heart disease    Personal history of long-term (current) use of anticoagulants    Shingles     Family History  Problem Relation Age of Onset   Uterine cancer Mother    Hypertension Son    Heart failure Cousin    Coronary artery disease Other    Hypertension Other    Heart attack Neg Hx    Stroke Neg Hx    Past Surgical  History:  Procedure Laterality Date   ABDOMINAL HYSTERECTOMY     CARDIAC CATHETERIZATION  04/15/2007   Est. EF of 50-55% -- Smooth and normal coronary arteries -- Normal left ventricular systolic function.  We will continue with further treatment of her atrial fibrillation      PARTIAL HYSTERECTOMY     Social History   Social History Narrative   Not on file   Immunization History  Administered Date(s) Administered   Influenza Split 08/13/2015, 08/25/2017   Influenza, Quadrivalent, Recombinant, Inj, Pf 08/27/2019, 10/08/2020, 09/07/2021   Influenza,inj,Quad PF,6+ Mos 09/01/2014, 09/10/2017, 09/06/2018   Influenza,inj,quad, With Preservative 08/29/2016   Influenza-Unspecified 10/22/2013   PFIZER(Purple Top)SARS-COV-2 Vaccination 03/10/2020, 03/31/2020, 10/22/2020, 04/08/2021   Tdap 12/09/2012   Zoster, Live 04/08/2021, 06/08/2021     Objective: Vital Signs: BP (!) 142/67 (BP Location: Right Arm, Patient Position: Sitting, Cuff Size: Normal)   Pulse 65   Resp 16   Ht _0  (1.6 m)   Wt 237 lb (107.5 kg)   BMI 41.98 kg/m    Physical Exam Constitutional:      Appearance: She is obese.  Eyes:     Conjunctiva/sclera: Conjunctivae normal.  Cardiovascular:     Rate and Rhythm: Normal rate and regular rhythm.  Pulmonary:     Effort: Pulmonary effort is normal.     Breath sounds: Normal breath sounds.  Skin:    General: Skin is warm and dry.     Findings: No rash.  Neurological:     General: No focal deficit present.     Mental Status: She is alert.     Comments: Motor strength 5/5 throughout  Psychiatric:        Mood and Affect: Mood normal.      Musculoskeletal Exam:  Neck full ROM no tenderness Shoulders full ROM, left should pain with abduction overhead but no strength or range deficit Elbows full ROM no tenderness or swelling Wrists full ROM no tenderness or swelling Mild squaring of 1st CMC joints, lateral deviation of PIP and DIP joints in 2nd fingers, no  palpable synovitis Left hip lateral pain provoked with FADIR movement, lateral tenderness to palpation, no right hip pain Knees bony enlargement, no effusions, slightly decreased flexion ROM Ankles full ROM no tenderness or swelling  Unable to stand from chair without using arms  Investigation: No additional findings.  Imaging: No results found.  Recent Labs: Lab Results  Component Value Date   WBC 7.1 11/04/2021   HGB 11.8 (L) 11/04/2021   PLT 346 11/04/2021   NA 141 11/04/2021   K 3.2 (L) 11/04/2021   CL 106 11/04/2021   CO2 28 11/04/2021   GLUCOSE 114 (H) 11/04/2021   BUN 11 11/04/2021   CREATININE 0.82 11/04/2021   BILITOT 0.4 11/04/2021   ALKPHOS 61 11/04/2021   AST 37 11/04/2021   ALT 45 (H) 11/04/2021   PROT 5.9 (L) 11/04/2021   ALBUMIN 3.1 (L) 11/04/2021   CALCIUM 8.8 (  L) 11/04/2021   GFRAA 113 10/04/2020    Speciality Comments: No specialty comments available.  Procedures:  No procedures performed Allergies: Lipitor [atorvastatin calcium], Spironolactone, Ace inhibitors, Losartan potassium, and Omeprazole   Assessment / Plan:     Visit Diagnoses: Elevated CK - Plan: CK, Aldolase, Myositis Assessr Plus Jo-1 Autoabs Sedimentation rate elevation - Plan: Sedimentation rate  Rechecking abnormal serum markers with CK and sedimentation rate see if inflammatory disease activity remains equally present.  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  We will check for myositis antibody panel.  Bilateral hand pain - Plan: XR Hand 2 View Right, XR Hand 2 View Left  Pain in both hands no synovitis present on exam x-rays of the hands shows osteoarthritis although appears to be mild severity by x-ray worst at the first [facility] joint and second and third DIPs which is consistent with exam.  Other fatigue  Orders: Orders Placed This Encounter  Procedures   XR Hand 2 View Right    XR Hand 2 View Left   Sedimentation rate   CK   Aldolase   Myositis Assessr Plus Jo-1 Autoabs   No orders of the defined types were placed in this encounter.    Follow-Up Instructions: Return in about 3 weeks (around 01/24/2022) for New pt +CK/OA f/u.   Collier Salina, MD  Note - This record has been created using [employer].  Chart creation errors have been sought, but may not always  have been located. Such creation errors do not reflect on  the standard of medical care.

## 2022-01-03 ENCOUNTER — Ambulatory Visit (INDEPENDENT_AMBULATORY_CARE_PROVIDER_SITE_OTHER): Payer: Medicaid Other | Admitting: Internal Medicine

## 2022-01-03 ENCOUNTER — Encounter: Payer: Self-pay | Admitting: Internal Medicine

## 2022-01-03 ENCOUNTER — Ambulatory Visit (INDEPENDENT_AMBULATORY_CARE_PROVIDER_SITE_OTHER): Payer: Medicaid Other

## 2022-01-03 ENCOUNTER — Other Ambulatory Visit: Payer: Self-pay

## 2022-01-03 VITALS — BP 142/67 | HR 65 | Resp 16 | Ht 63.0 in | Wt 237.0 lb

## 2022-01-03 DIAGNOSIS — R748 Abnormal levels of other serum enzymes: Secondary | ICD-10-CM | POA: Diagnosis not present

## 2022-01-03 DIAGNOSIS — D131 Benign neoplasm of stomach: Secondary | ICD-10-CM | POA: Insufficient documentation

## 2022-01-03 DIAGNOSIS — M79642 Pain in left hand: Secondary | ICD-10-CM

## 2022-01-03 DIAGNOSIS — R700 Elevated erythrocyte sedimentation rate: Secondary | ICD-10-CM

## 2022-01-03 DIAGNOSIS — M79641 Pain in right hand: Secondary | ICD-10-CM

## 2022-01-03 DIAGNOSIS — R5383 Other fatigue: Secondary | ICD-10-CM | POA: Diagnosis not present

## 2022-01-03 DIAGNOSIS — D6869 Other thrombophilia: Secondary | ICD-10-CM | POA: Insufficient documentation

## 2022-01-03 DIAGNOSIS — M170 Bilateral primary osteoarthritis of knee: Secondary | ICD-10-CM | POA: Insufficient documentation

## 2022-01-03 DIAGNOSIS — R7303 Prediabetes: Secondary | ICD-10-CM | POA: Insufficient documentation

## 2022-01-03 DIAGNOSIS — E559 Vitamin D deficiency, unspecified: Secondary | ICD-10-CM | POA: Insufficient documentation

## 2022-01-03 DIAGNOSIS — D509 Iron deficiency anemia, unspecified: Secondary | ICD-10-CM | POA: Insufficient documentation

## 2022-01-05 ENCOUNTER — Other Ambulatory Visit (HOSPITAL_COMMUNITY): Payer: Self-pay

## 2022-01-10 LAB — MYOSITIS ASSESSR PLUS JO-1 AUTOABS
EJ Autoabs: NOT DETECTED
Jo-1 Autoabs: <1 AI
Ku Autoabs: NOT DETECTED
Mi-2 Autoabs: NOT DETECTED
OJ Autoabs: NOT DETECTED
PL-12 Autoabs: NOT DETECTED
PL-7 Autoabs: NOT DETECTED
SRP Autoabs: NOT DETECTED

## 2022-01-10 LAB — CK: Total CK: 301 U/L — ABNORMAL HIGH (ref 29–143)

## 2022-01-10 LAB — SEDIMENTATION RATE: Sed Rate: 25 mm/h (ref 0–30)

## 2022-01-10 LAB — ALDOLASE: Aldolase: 5.8 U/L (ref <=8.1)

## 2022-01-19 ENCOUNTER — Other Ambulatory Visit (HOSPITAL_COMMUNITY): Payer: Self-pay

## 2022-01-23 NOTE — Progress Notes (Signed)
Office Visit Note  Patient: Jacqueline Petersen             Date of Birth: 1956-02-14           MRN: [id number]             PCP: Donald Prose, MD Referring: Donald Prose, MD Visit Date: 01/24/2022   Subjective:  Numbness of the Right Hand (Tips of fingers numb), Numbness of the Left Hand (Numbness tips of fingers), Weakness, and Muscle Pain   History of Present Illness: Jacqueline Petersen is a 66 y.o. female here for follow up for muscle aches and weakness labs at initial visit showing mildly elevated CK of 301, ESR and aldolase and myositis Ab panel were negative. Xrays of bialteral hands checked showing degenerative arthritis changes in thumbs and DIP finger joints.    Previous HPI  01/03/22 Jacqueline Petersen is a 67 y.o. female here for evaluation of fatigue and multiple joint pains associated with elevated ESR and CK. She has a known history of bilateral knee osteoarthritis. She was recommended to try stopping statin treatment as a possible contributor.  So far that does not had a significant impact on her symptoms.  She does have osteoarthritis and describes some joint pain and short duration of morning stiffness but a lot of pain is more over muscular areas or generalized sensitivity.  She is not seeing any skin rashes or swelling in specific areas.  Strength remains intact just has more pain and difficulty using the range of motion.  No new skin rashes.  She does become quickly short of breath with exertion but no complaint of new coughs or shortness of breath at rest.   Labs reviewed 10/2021 ESR 41 CK 306   09/2021 ESR 32 CK 299 CBC wnl CMP unremarkable   Review of Systems  Constitutional:  Positive for fatigue.       States not sleeping well due to CPAP not working well for her anymore, will make appointment for this soon  HENT: Negative.    Eyes:  Positive for visual disturbance.  Respiratory: Negative.    Cardiovascular:  Positive for irregular  heartbeat.  Gastrointestinal: Negative.   Endocrine: Positive for cold intolerance.       Cold all the time, states due to blood thinner  Genitourinary: Negative.   Musculoskeletal:  Positive for joint swelling, myalgias, muscle weakness, morning stiffness and myalgias.  Skin: Negative.   Neurological:  Positive for numbness and weakness.  Hematological: Negative.   Psychiatric/Behavioral: Negative.      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   Bilateral primary osteoarthritis of knee 01/03/2022   Iron deficiency anemia 01/03/2022   Prediabetes 01/03/2022   Vitamin D deficiency 01/03/2022   Sedimentation rate elevation 01/03/2022   Elevated CK 01/03/2022   Bilateral hand pain 01/03/2022   Other fatigue 01/03/2022   Obstructive sleep apnea 07/17/2017   Morbid obesity due to excess calories ([place]) 07/17/2017   PVC's (premature ventricular contractions) 01/26/2016   Long term (current) use of anticoagulants 12/09/2013   Atrial fibrillation with RVR ([place]) 12/03/2013   Hyperlipidemia 06/12/2007   Essential hypertension 06/12/2007   ATRIAL FIBRILLATION 06/12/2007   GERD 06/12/2007    Past Medical History:  Diagnosis Date   A-fib (HCC)    Acid reflux    Arthritis    Dyslipidemia    Fatigue  GERD (gastroesophageal reflux disease)    History of hysterectomy    Hx of echocardiogram    Echocardiogram (12/15): EF 55-60%, normal wall motion, PASP 32 mmHg   Hyperlipidemia    Hypertension    Hypokalemia    Obesity    Personal history of hypertensive heart disease    Personal history of long-term (current) use of anticoagulants    Shingles     Family History  Problem Relation Age of Onset   Uterine cancer Mother    Hypertension Son    Heart failure Cousin    Coronary artery disease Other    Hypertension Other    Heart attack Neg Hx    Stroke Neg Hx    Past  Surgical History:  Procedure Laterality Date   ABDOMINAL HYSTERECTOMY     CARDIAC CATHETERIZATION  04/15/2007   Est. EF of 50-55% -- Smooth and normal coronary arteries -- Normal left ventricular systolic function.  We will continue with further treatment of her atrial fibrillation      PARTIAL HYSTERECTOMY     Social History   Social History Narrative   Not on file   Immunization History  Administered Date(s) Administered   Influenza Split 08/13/2015, 08/25/2017   Influenza, Quadrivalent, Recombinant, Inj, Pf 08/27/2019, 10/08/2020, 09/07/2021   Influenza,inj,Quad PF,6+ Mos 09/01/2014, 09/10/2017, 09/06/2018   Influenza,inj,quad, With Preservative 08/29/2016   Influenza-Unspecified 10/22/2013   PFIZER(Purple Top)SARS-COV-2 Vaccination 03/10/2020, 03/31/2020, 10/22/2020, 04/08/2021   Tdap 12/09/2012   Zoster, Live 04/08/2021, 06/08/2021     Objective: Vital Signs: BP (!) 143/72   Pulse 66   Ht 5' 3" (1.6 m)   Wt 237 lb (107.5 kg)   BMI 41.98 kg/m    Physical Exam Cardiovascular:     Rate and Rhythm: Normal rate and regular rhythm.  Pulmonary:     Effort: Pulmonary effort is normal.     Breath sounds: Normal breath sounds.  Musculoskeletal:     Right lower leg: No edema.     Left lower leg: No edema.  Skin:    General: Skin is warm and dry.     Findings: No rash.  Neurological:     Mental Status: She is alert.      Musculoskeletal Exam:  Neck full ROM no tenderness Shoulders full ROM no tenderness or swelling Elbows full ROM no tenderness or swelling Wrists full ROM no tenderness or swelling Fingers wearing a first CMC joints, lateral deviation of distal segment of index fingers, no palpable synovitis or focal tenderness to exam Lateral hip tenderness to pressure and with FADIR movement worse on the left side Knees full ROM no tenderness or swelling  Limited ultrasound inspection of wrist demonstrates Median nerve CSA 65m^2 on left and bifid nerve on  right  Investigation: No additional findings.  Imaging: No results found.  Recent Labs: Lab Results  Component Value Date   WBC 7.1 11/04/2021   HGB 11.8 (L) 11/04/2021   PLT 346 11/04/2021   NA 141 11/04/2021   K 3.2 (L) 11/04/2021   CL 106 11/04/2021   CO2 28 11/04/2021   GLUCOSE 114 (H) 11/04/2021   BUN 11 11/04/2021   CREATININE 0.82 11/04/2021   BILITOT 0.4 11/04/2021   ALKPHOS 61 11/04/2021   AST 37 11/04/2021   ALT 45 (H) 11/04/2021   PROT 5.9 (L) 11/04/2021   ALBUMIN 3.1 (L) 11/04/2021   CALCIUM 8.8 (L) 11/04/2021   GFRAA 113 10/04/2020    Speciality Comments: No specialty comments available.  Procedures:  No procedures performed Allergies: Lipitor [atorvastatin calcium], Spironolactone, Ace inhibitors, Losartan potassium, and Omeprazole   Assessment / Plan:     Visit Diagnoses: Bilateral hand pain Bilateral primary osteoarthritis of knee  At this time symptoms appear most consistent with primary generalized osteoarthritis of multiple sites including both hands and knees.  May have some chronic pain sensitization including muscle areas or could be from compensation due to abnormal range of motion.  No inflammatory arthritis changes seen at last visit or this visit or on imaging.  Very mildly abnormal CK I doubt that is a truly clinically significant finding especially with normal aldolase.  Numbness and tingling problem in hands have some suspicion for carpal tunnel syndrome ultrasound exam is not definitive for this.  If symptoms are getting worse looking to pursue some type of procedural intervention could send for nerve conduction study bilateral upper extremities.  Orders: No orders of the defined types were placed in this encounter.  No orders of the defined types were placed in this encounter.    Follow-Up Instructions: Return if symptoms worsen or fail to improve.   Collier Salina, MD  Note - This record has been created using [employer].   Chart creation errors have been sought, but may not always  have been located. Such creation errors do not reflect on  the standard of medical care.

## 2022-01-24 ENCOUNTER — Encounter: Payer: Self-pay | Admitting: Internal Medicine

## 2022-01-24 ENCOUNTER — Other Ambulatory Visit: Payer: Self-pay

## 2022-01-24 ENCOUNTER — Ambulatory Visit (INDEPENDENT_AMBULATORY_CARE_PROVIDER_SITE_OTHER): Payer: Medicaid Other | Admitting: Internal Medicine

## 2022-01-24 VITALS — BP 143/72 | HR 66 | Ht 63.0 in | Wt 237.0 lb

## 2022-01-24 DIAGNOSIS — M79641 Pain in right hand: Secondary | ICD-10-CM | POA: Diagnosis not present

## 2022-01-24 DIAGNOSIS — M79642 Pain in left hand: Secondary | ICD-10-CM | POA: Diagnosis not present

## 2022-01-24 DIAGNOSIS — R700 Elevated erythrocyte sedimentation rate: Secondary | ICD-10-CM

## 2022-01-24 DIAGNOSIS — M170 Bilateral primary osteoarthritis of knee: Secondary | ICD-10-CM | POA: Diagnosis not present

## 2022-01-24 DIAGNOSIS — R748 Abnormal levels of other serum enzymes: Secondary | ICD-10-CM

## 2022-02-17 ENCOUNTER — Other Ambulatory Visit: Payer: Self-pay | Admitting: Family Medicine

## 2022-02-17 DIAGNOSIS — Z1231 Encounter for screening mammogram for malignant neoplasm of breast: Secondary | ICD-10-CM

## 2022-02-23 ENCOUNTER — Other Ambulatory Visit (HOSPITAL_COMMUNITY): Payer: Self-pay

## 2022-02-25 ENCOUNTER — Other Ambulatory Visit (HOSPITAL_COMMUNITY): Payer: Self-pay

## 2022-03-24 ENCOUNTER — Ambulatory Visit
Admission: RE | Admit: 2022-03-24 | Discharge: 2022-03-24 | Disposition: A | Discharge status: Home / Self Care | Payer: Medicaid Other | Source: Ambulatory Visit | Attending: Family Medicine | Admitting: Family Medicine

## 2022-03-24 DIAGNOSIS — Z1231 Encounter for screening mammogram for malignant neoplasm of breast: Secondary | ICD-10-CM

## 2022-04-13 ENCOUNTER — Other Ambulatory Visit (HOSPITAL_COMMUNITY): Payer: Self-pay

## 2022-04-13 MED ORDER — FERROUS SULFATE 325 (65 FE) MG PO TBEC: DELAYED_RELEASE_TABLET | ORAL | 3 refills | Status: AC

## 2022-04-13 MED ORDER — METOPROLOL SUCCINATE ER 100 MG PO TB24
ORAL_TABLET | ORAL | 1 refills | Status: DC
  Filled 2022-04-13 – 2022-05-26 (×3): qty 90, 90d supply, fill #0
  Filled 2022-09-05: qty 90, 90d supply, fill #1

## 2022-04-13 MED ORDER — ROSUVASTATIN CALCIUM 10 MG PO TABS
ORAL_TABLET | ORAL | 3 refills | Status: DC
  Filled 2022-04-24: qty 90, 90d supply, fill #0
  Filled 2022-07-25: qty 90, 90d supply, fill #1

## 2022-04-13 MED ORDER — POTASSIUM CHLORIDE ER 20 MEQ PO TBCR
EXTENDED_RELEASE_TABLET | ORAL | 3 refills | Status: DC
  Filled 2022-04-24: qty 62, 62d supply, fill #0
  Filled 2022-04-24: qty 28, 28d supply, fill #0
  Filled 2022-07-25: qty 90, 90d supply, fill #1
  Filled 2022-10-27: qty 90, 90d supply, fill #2
  Filled 2023-01-24: qty 90, 90d supply, fill #3

## 2022-04-21 ENCOUNTER — Telehealth: Payer: Self-pay | Admitting: *Deleted

## 2022-04-21 NOTE — Telephone Encounter (Signed)
? ?  Pre-operative Risk Assessment  ?  ?Patient Name: Jacqueline Petersen  ?DOB: March 21, 1956 ?MRN: [id number]  ? ?  ? ?Request for Surgical Clearance   ? ?Procedure:   Colonoscopy/endoscopy ? ?Date of Surgery:  Clearance 07/18/22                              ?   ?Surgeon:  Dr Alessandra Bevels ?Surgeon's Group or Practice Name:  [facility] ?Phone number:  [phone] ?Fax number:  [phone] ?  ?Type of Clearance Requested:   ?- Pharmacy:  Hold Apixaban (Eliquis) for 2 days ?  ?Type of Anesthesia:   Propofol ?  ?Additional requests/questions:   ? ?Signed, ?Bevelyn Buckles L   ?04/21/2022, 5:01 PM  ? ?

## 2022-04-24 ENCOUNTER — Other Ambulatory Visit (HOSPITAL_COMMUNITY): Payer: Self-pay

## 2022-04-24 NOTE — Telephone Encounter (Signed)
Will route to pharm then plan tele call, last OV 10/2021. ?

## 2022-04-24 NOTE — Telephone Encounter (Signed)
? ? ?  Name: Jacqueline Petersen  ?DOB: Oct 30, 1956  ?MRN: [id number] ? ?Primary Cardiologist: [name], MD ? ? ?Preoperative team, please contact this patient and set up a phone call appointment for further preoperative risk assessment. Please obtain consent and complete medication review. Thank you for your help. ? ?I confirm that guidance regarding antiplatelet and oral anticoagulation therapy has been completed and, if necessary, noted below. ? ? ? ?Charlie Pitter, PA-C ?04/24/2022, 11:38 AM ?[facility] ?[address] ?[place], [place] [address] ? ? ?

## 2022-04-24 NOTE — Telephone Encounter (Signed)
Patient with diagnosis of afib on Eliquis for anticoagulation.   ? ?Procedure: colonoscopy/endoscopy ?Date of procedure: 07/18/22 ? ?CHA2DS2-VASc Score = 2  ?This indicates a 2.2% annual risk of stroke. ?The patient's score is based upon: ?CHF History: 0 ?HTN History: 1 ?Diabetes History: 0 ?Stroke History: 0 ?Vascular Disease History: 0 ?Age Score: 0 ?Gender Score: 1 ?  ?CrCl  23m/min using adjusted body weight due to obesity ?Platelet count 346K ? ?Per office protocol, patient can hold Eliquis for 2 days prior to procedure as requested. ?

## 2022-04-25 ENCOUNTER — Other Ambulatory Visit (HOSPITAL_COMMUNITY): Payer: Self-pay

## 2022-04-25 NOTE — Telephone Encounter (Signed)
Left message to call back for tele pre op appt 

## 2022-04-27 NOTE — Telephone Encounter (Signed)
Left message for the patient to contact office.  

## 2022-04-28 NOTE — Telephone Encounter (Signed)
Left message for patient to contact the office.  ? ?Left message on son, Trudee Grip, for him to advise the patient to contact the office. ?

## 2022-05-01 ENCOUNTER — Telehealth: Payer: Self-pay | Admitting: *Deleted

## 2022-05-01 NOTE — Telephone Encounter (Signed)
Set up pt for telephone appointment on 05/02/2022 @ 1:00.  All medications were reconciled.  ? ? ? ?  ?Patient Consent for Virtual Visit  ? ? ?   ? ?Jacqueline Petersen has provided verbal consent on 05/01/2022 for a virtual visit (video or telephone). ? ? ?CONSENT FOR VIRTUAL VISIT FOR:  Jacqueline Petersen  ?By participating in this virtual visit I agree to the following: ? ?I hereby voluntarily request, consent and authorize [facility] and its employed or contracted physicians, physician assistants, nurse practitioners or other licensed health care professionals (the Practitioner), to provide me with telemedicine health care services (the ?Services") as deemed necessary by the treating Practitioner. I acknowledge and consent to receive the Services by the Practitioner via telemedicine. I understand that the telemedicine visit will involve communicating with the Practitioner through live audiovisual communication technology and the disclosure of certain medical information by electronic transmission. I acknowledge that I have been given the opportunity to request an in-person assessment or other available alternative prior to the telemedicine visit and am voluntarily participating in the telemedicine visit. ? ?I understand that I have the right to withhold or withdraw my consent to the use of telemedicine in the course of my care at any time, without affecting my right to future care or treatment, and that the Practitioner or I may terminate the telemedicine visit at any time. I understand that I have the right to inspect all information obtained and/or recorded in the course of the telemedicine visit and may receive copies of available information for a reasonable fee.  I understand that some of the potential risks of receiving the Services via telemedicine include:  ?Delay or interruption in medical evaluation due to technological equipment failure or disruption; ?Information transmitted may not be  sufficient (e.g. poor resolution of images) to allow for appropriate medical decision making by the Practitioner; and/or  ?In rare instances, security protocols could fail, causing a breach of personal health information. ? ?Furthermore, I acknowledge that it is my responsibility to provide information about my medical history, conditions and care that is complete and accurate to the best of my ability. I acknowledge that Practitioner's advice, recommendations, and/or decision may be based on factors not within their control, such as incomplete or inaccurate data provided by me or distortions of diagnostic images or specimens that may result from electronic transmissions. I understand that the practice of medicine is not an exact science and that Practitioner makes no warranties or guarantees regarding treatment outcomes. I acknowledge that a copy of this consent can be made available to me via my patient portal ([facility]), or I can request a printed copy by calling the office of [facility].   ? ?I understand that my insurance will be billed for this visit.  ? ?I have read or had this consent read to me. ?I understand the contents of this consent, which adequately explains the benefits and risks of the Services being provided via telemedicine.  ?I have been provided ample opportunity to ask questions regarding this consent and the Services and have had my questions answered to my satisfaction. ?I give my informed consent for the services to be provided through the use of telemedicine in my medical care ? ?  ?

## 2022-05-01 NOTE — Telephone Encounter (Signed)
Pt scheduled on 05/02/22 for telephone visit.  ?

## 2022-05-01 NOTE — Telephone Encounter (Signed)
Patient returning call. Phone: [phone] ?

## 2022-05-02 ENCOUNTER — Ambulatory Visit (INDEPENDENT_AMBULATORY_CARE_PROVIDER_SITE_OTHER): Payer: Medicaid Other | Admitting: Physician Assistant

## 2022-05-02 DIAGNOSIS — Z01810 Encounter for preprocedural cardiovascular examination: Secondary | ICD-10-CM | POA: Diagnosis not present

## 2022-05-02 NOTE — Progress Notes (Signed)
? ?Virtual Visit via Telephone Note  ? ?This visit type was conducted due to national recommendations for restrictions regarding the COVID-19 Pandemic (e.g. social distancing) in an effort to limit this patient's exposure and mitigate transmission in our community.  Due to her co-morbid illnesses, this patient is at least at moderate risk for complications without adequate follow up.  This format is felt to be most appropriate for this patient at this time.  The patient did not have access to video technology/had technical difficulties with video requiring transitioning to audio format only (telephone).  All issues noted in this document were discussed and addressed.  No physical exam could be performed with this format.  Please refer to the patient's chart for her  consent to telehealth for [facility] ? ?Evaluation Performed:  Preoperative cardiovascular risk assessment ?_____________  ? ?Date:  05/02/2022  ? ?Patient ID:  Jacqueline Petersen, Jacqueline Petersen August 26, 1956, MRN [id number] ?Patient Location:  ?Home ?Provider location:   ?Office ? ?Primary Care Provider:  Donald Prose, MD ?Primary Cardiologist:  [name], MD ? ?Chief Complaint  ?  ?66 y.o. y/o female with a h/o hypertension, hyperlipidemia, PAF, obstructive sleep apnea, normal cors on cath 03/2007 and history of PVCs, who is pending colonoscopy, and presents today for telephonic preoperative cardiovascular risk assessment. ? ?Past Medical History  ?  ?Past Medical History:  ?Diagnosis Date  ? A-fib ([place])   ? Acid reflux   ? Arthritis   ? Dyslipidemia   ? Fatigue   ? GERD (gastroesophageal reflux disease)   ? History of hysterectomy   ? Hx of echocardiogram   ? Echocardiogram (12/15): EF 55-60%, normal wall motion, PASP 32 mmHg  ? Hyperlipidemia   ? Hypertension   ? Hypokalemia   ? Obesity   ? Personal history of hypertensive heart disease   ? Personal history of long-term (current) use of anticoagulants   ? Shingles   ? ?Past Surgical History:   ?Procedure Laterality Date  ? ABDOMINAL HYSTERECTOMY    ? CARDIAC CATHETERIZATION  04/15/2007  ? Est. EF of 50-55% -- Smooth and normal coronary arteries -- Normal left ventricular systolic function.  We will continue with further treatment of her atrial fibrillation     ? PARTIAL HYSTERECTOMY    ? ? ?Allergies ? ?Allergies  ?Allergen Reactions  ? Lipitor [Atorvastatin Calcium] Swelling  ? Spironolactone Swelling  ?  Facial and lip swelling/denies respiratory distress  ? Ace Inhibitors   ?  Other reaction(s): cough?  ? Losartan Potassium   ?  Other reaction(s): runny nose  ? Omeprazole   ?  Other reaction(s): Unknown  ? ? ?History of Present Illness  ?  ?Jacqueline Petersen is a 66 y.o. female who presents via audio/video conferencing for a telehealth visit today.  Pt was last seen in cardiology clinic on 11/21/2021 by Dr. Acie Fredrickson.  At that time [facility] was doing well.  The patient is now pending procedure as outlined above. Since her last visit, she has been doing well without exertional chest pain or worsening dyspnea. ? ? ?Home Medications  ?  ?Prior to Admission medications   ?Medication Sig Start Date End Date Taking? Authorizing Provider  ?apixaban (ELIQUIS) 5 MG TABS tablet Take 1 tablet by mouth 2  times daily. 12/27/21   Loel Dubonnet, NP  ?Cholecalciferol (VITAMIN D) 2000 UNITS CAPS Take 2,000 Units by mouth every evening.    [provider]  ?ferrous sulfate 325 (65 FE) MG EC  tablet Take 325 mg by mouth daily with breakfast.    [provider]  ?ferrous sulfate 325 (65 FE) MG EC tablet Take 1 tablet by mouth once a day 04/12/22     ?hydrochlorothiazide (HYDRODIURIL) 25 MG tablet Take 1 tablet (25 mg total) by mouth daily. 06/03/21   Nahser, Wonda Cheng, MD  ?iron polysaccharides (NIFEREX) 150 MG capsule TAKE 1 CAPSULE BY MOUTH ONCE A DAY ?Patient not taking: Reported on 01/03/2022 06/04/20 06/04/21  Harlan Stains, MD  ?metoprolol succinate (TOPROL-XL) 100 MG 24 hr  tablet TAKE 1 TABLET BY MOUTH AT BEDTIME 11/23/20 01/24/22  Nahser, Wonda Cheng, MD  ?metoprolol succinate (TOPROL-XL) 100 MG 24 hr tablet Take 1 tablet by mouth daily 04/12/22     ?Potassium Chloride ER 20 MEQ TBCR Take 1 tablet by mouth daily with food 04/12/22     ?potassium chloride SA (KLOR-CON) 20 MEQ tablet TAKE 1 TABLET BY MOUTH ONCE DAILY 03/30/20 01/24/22  [name] T, PA-C  ?rosuvastatin (CRESTOR) 10 MG tablet TAKE 1 TABLET BY MOUTH ONCE A DAY 12/03/20 01/24/22  Donald Prose, MD  ?rosuvastatin (CRESTOR) 10 MG tablet Take 1 tablet by mouth in the evening 04/12/22     ?thiamine (VITAMIN B-1) 100 MG tablet Take 100 mg by mouth daily.    [provider]  ? ? ?Physical Exam  ?  ?Vital Signs:  Jacqueline Petersen does not have vital signs available for review today. ? ?Given telephonic nature of communication, physical exam is limited. ?AAOx3. NAD. Normal affect.  Speech and respirations are unlabored. ? ?Accessory Clinical Findings  ?  ?None ? ?Assessment & Plan  ?  ?1.  Preoperative Cardiovascular Risk Assessment: ? -Patient has a history of PAF, clean coronary artery on previous cardiac catheterization in 2009.  She denies any recent exertional chest pain or worsening dyspnea.  She has good cardiac awareness of atrial fibrillation.  Although she does have palpitation recently, she is currently not in atrial fibrillation.  Last EKG obtained in November 2022 showed a normal sinus rhythm.  She is cleared to proceed with colonoscopy procedure without further work-up.  She may hold Eliquis for 2 days prior to the procedure and restart as once possible afterward at her GI doctors discretion. ? ?A copy of this note will be routed to requesting surgeon. ? ?Time:   ?Today, I have spent 5 minutes with the patient with telehealth technology discussing medical history, symptoms, and management plan.   ? ? ?Almyra Deforest, [place] ? ?05/02/2022, 12:53 PM ? ?

## 2022-05-16 ENCOUNTER — Encounter (HOSPITAL_COMMUNITY): Payer: Self-pay | Admitting: Emergency Medicine

## 2022-05-16 ENCOUNTER — Other Ambulatory Visit: Payer: Self-pay

## 2022-05-16 ENCOUNTER — Emergency Department (HOSPITAL_COMMUNITY): Payer: Medicaid Other

## 2022-05-16 ENCOUNTER — Emergency Department (HOSPITAL_COMMUNITY)
Admission: EM | Admit: 2022-05-16 | Discharge: 2022-05-17 | Disposition: A | Discharge status: Home / Self Care | Payer: Medicaid Other | Attending: Physician Assistant | Admitting: Physician Assistant

## 2022-05-16 DIAGNOSIS — Z20822 Contact with and (suspected) exposure to covid-19: Secondary | ICD-10-CM | POA: Diagnosis not present

## 2022-05-16 DIAGNOSIS — Z79899 Other long term (current) drug therapy: Secondary | ICD-10-CM | POA: Diagnosis not present

## 2022-05-16 DIAGNOSIS — I10 Essential (primary) hypertension: Secondary | ICD-10-CM | POA: Diagnosis not present

## 2022-05-16 DIAGNOSIS — R0989 Other specified symptoms and signs involving the circulatory and respiratory systems: Secondary | ICD-10-CM | POA: Insufficient documentation

## 2022-05-16 DIAGNOSIS — Z7901 Long term (current) use of anticoagulants: Secondary | ICD-10-CM | POA: Insufficient documentation

## 2022-05-16 DIAGNOSIS — R051 Acute cough: Secondary | ICD-10-CM | POA: Insufficient documentation

## 2022-05-16 DIAGNOSIS — E119 Type 2 diabetes mellitus without complications: Secondary | ICD-10-CM | POA: Insufficient documentation

## 2022-05-16 LAB — RESP PANEL BY RT-PCR (FLU A&B, COVID) ARPGX2
Influenza A by PCR: NEGATIVE
Influenza B by PCR: NEGATIVE
SARS Coronavirus 2 by RT PCR: NEGATIVE

## 2022-05-16 NOTE — ED Provider Triage Note (Signed)
Emergency Medicine Provider Triage Evaluation Note  Shresta Risden , a 66 y.o. female  was evaluated in triage.  Pt complains of cough, decreased energy, myalgias.  Sick contacts including her 6-year-old grandchild with similar symptoms.  Reports chest congestion when she coughs.  No chest pain.  No wheezing or trouble breathing.  1 episode of diarrhea.  Review of Systems  Positive: Above Negative: Chest pain, shortness of breath, wheezing  Physical Exam  BP (!) 170/74 (BP Location: Left Arm)   Pulse 68   Temp 97.9 F (36.6 C) (Oral)   Resp 18   Ht '5\' 3"'$  (1.6 m)   Wt 108.9 kg   SpO2 100%   BMI 42.51 kg/m  Gen:   Awake, no distress   Resp:  Normal effort  MSK:   Moves extremities without difficulty  Other:  Lungs clear bilaterally, speaking complete sentences without difficulty  Medical Decision Making  Medically screening exam initiated at 9:42 PM.  Appropriate orders placed.  Lyah Kaytelynn Scripter was informed that the remainder of the evaluation will be completed by another provider, this initial triage assessment does not replace that evaluation, and the importance of remaining in the ED until their evaluation is complete.  COVID test and chest x-ray ordered   Delia Heady, PA-C 05/16/22 2143

## 2022-05-16 NOTE — ED Triage Notes (Signed)
Patient endorses weakness, cough, loss of taste and smell.  Patient around grandchild who is also sick with same symptoms.  Patient endorses diarrhea today.

## 2022-05-17 ENCOUNTER — Other Ambulatory Visit: Payer: Self-pay

## 2022-05-17 ENCOUNTER — Other Ambulatory Visit (HOSPITAL_COMMUNITY): Payer: Self-pay

## 2022-05-17 MED ORDER — AZITHROMYCIN 250 MG PO TABS
250.0000 mg | ORAL_TABLET | Freq: Every day | ORAL | 0 refills | Status: DC
  Filled 2022-05-17: qty 6, 6d supply, fill #0

## 2022-05-17 NOTE — ED Provider Notes (Signed)
[facility] EMERGENCY DEPARTMENT Provider Note   CSN: [id number] Arrival date & time: 05/16/22  2132     History  Chief Complaint  Patient presents with   Covid Exposure    Jacqueline Petersen is a 66 y.o. female.  HPI  With medical history including hypertension, A-fib, currently on anticoag's, GERD presents with chief complaint of URI-like symptoms.  She states that her 2 grandkids are sick with a URI, and  started to feel unwell about 1 week ago.  She endorses fevers chills productive cough and general body aches.  She denies any chest pain shortness of breath or wheezing stomach pains nausea vomiting, had 2 episode of diarrhea today no melena or hematochezia.  Patient is not immunocompromise, up-to-date on her COVID and her influenza vaccines.  She has no other complaints.  Home Medications Prior to Admission medications   Medication Sig Start Date End Date Taking? Authorizing Provider  azithromycin (ZITHROMAX) 250 MG tablet Take 1 tablet (250 mg total) by mouth daily. Take first 2 tablets together, then 1 every day until finished. 05/17/22  Yes Marcello Fennel, PA-C  apixaban (ELIQUIS) 5 MG TABS tablet Take 1 tablet by mouth 2  times daily. 12/27/21   Loel Dubonnet, NP  Cholecalciferol (VITAMIN D) 2000 UNITS CAPS Take 2,000 Units by mouth every evening.    [provider]  ferrous sulfate 325 (65 FE) MG EC tablet Take 325 mg by mouth daily with breakfast.    [provider]  ferrous sulfate 325 (65 FE) MG EC tablet Take 1 tablet by mouth once a day 04/12/22     hydrochlorothiazide (HYDRODIURIL) 25 MG tablet Take 1 tablet (25 mg total) by mouth daily. 06/03/21   Nahser, Wonda Cheng, MD  iron polysaccharides (NIFEREX) 150 MG capsule TAKE 1 CAPSULE BY MOUTH ONCE A DAY Patient not taking: Reported on 01/03/2022 06/04/20 06/04/21  Harlan Stains, MD  metoprolol succinate (TOPROL-XL) 100 MG 24 hr tablet TAKE 1 TABLET BY MOUTH AT BEDTIME  11/23/20 01/24/22  Nahser, Wonda Cheng, MD  metoprolol succinate (TOPROL-XL) 100 MG 24 hr tablet Take 1 tablet by mouth daily 04/12/22     Potassium Chloride ER 20 MEQ TBCR Take 1 tablet by mouth daily with food 04/12/22     potassium chloride SA (KLOR-CON) 20 MEQ tablet TAKE 1 TABLET BY MOUTH ONCE DAILY 03/30/20 01/24/22  [name] T, PA-C  rosuvastatin (CRESTOR) 10 MG tablet TAKE 1 TABLET BY MOUTH ONCE A DAY 12/03/20 01/24/22  Donald Prose, MD  rosuvastatin (CRESTOR) 10 MG tablet Take 1 tablet by mouth in the evening 04/12/22     thiamine (VITAMIN B-1) 100 MG tablet Take 100 mg by mouth daily.    [provider]      Allergies    Lipitor [atorvastatin calcium], Spironolactone, Ace inhibitors, Losartan potassium, and Omeprazole    Review of Systems   Review of Systems  Constitutional:  Negative for chills and fever.  Respiratory:  Positive for cough. Negative for shortness of breath.   Cardiovascular:  Negative for chest pain.  Gastrointestinal:  Negative for abdominal pain.  Neurological:  Negative for headaches.   Physical Exam Updated Vital Signs BP (!) 170/74 (BP Location: Left Arm)   Pulse 68   Temp 97.9 F (36.6 C) (Oral)   Resp 18   Ht '5\' 3"'$  (1.6 m)   Wt 108.9 kg   SpO2 100%   BMI 42.51 kg/m  Physical Exam Vitals and nursing note  reviewed.  Constitutional:      General: She is not in acute distress.    Appearance: She is not ill-appearing.  HENT:     Head: Normocephalic and atraumatic.     Nose: No congestion.  Eyes:     Conjunctiva/sclera: Conjunctivae normal.  Cardiovascular:     Rate and Rhythm: Normal rate and regular rhythm.     Pulses: Normal pulses.     Heart sounds: No murmur heard.   No friction rub. No gallop.  Pulmonary:     Effort: No respiratory distress.     Breath sounds: Rhonchi present. No wheezing or rales.     Comments: No evidence of respiratory distress nontachypneic nonhypoxic able speak in full sentences, has intermittent rhonchi heard  in the left lower lobe which clears after coughing, no rales stridor or wheezing present. Abdominal:     Palpations: Abdomen is soft.     Tenderness: There is no abdominal tenderness. There is no right CVA tenderness or left CVA tenderness.  Musculoskeletal:     Right lower leg: No edema.     Left lower leg: No edema.  Skin:    General: Skin is warm and dry.  Neurological:     Mental Status: She is alert.  Psychiatric:        Mood and Affect: Mood normal.    ED Results / Procedures / Treatments   Labs (all labs ordered are listed, but only abnormal results are displayed) Labs Reviewed  RESP PANEL BY RT-PCR (FLU A&B, COVID) [id number]    EKG None  Radiology DG Chest 2 View  Result Date: 05/16/2022 CLINICAL DATA:  Cough and shortness of breath. EXAM: CHEST - 2 VIEW COMPARISON:  March 12, 2021 FINDINGS: The heart size and mediastinal contours are within normal limits. Both lungs are clear. Multilevel degenerative changes are noted throughout the thoracic spine. IMPRESSION: No active cardiopulmonary disease. Electronically Signed   By: Virgina Norfolk M.D.   On: 05/16/2022 22:08    Procedures Procedures    Medications Ordered in ED Medications - No data to display  ED Course/ Medical Decision Making/ A&P                           Medical Decision Making  This patient presents to the ED for concern of URI this involves an extensive number of treatment options, and is a complaint that carries with it a high risk of complications and morbidity.  The differential diagnosis includes URI ACS, PE, pneumonia,    Additional history obtained:  Additional history obtained from husband at bedside External records from outside source obtained and reviewed including previous PCP notes   Co morbidities that complicate the patient evaluation  Diabetes, on anticoag's  Social Determinants of Health:  N/A    Lab Tests:  I Ordered, and personally interpreted labs.  The pertinent  results include: Respiratory panel negative   Imaging Studies ordered:  I ordered imaging studies including chest x-ray I independently visualized and interpreted imaging which showed unremarkable I agree with the radiologist interpretation   Cardiac Monitoring:  The patient was maintained on a cardiac monitor.  I personally viewed and interpreted the cardiac monitored which showed an underlying rhythm of: EKG without signs of ischemia   Medicines ordered and prescription drug management:  I ordered medication including N/A I have reviewed the patients home medicines and have made adjustments as needed  Critical Interventions:  N/A   Reevaluation:  Presents with an upper URI, triage obtained lab work imaging which I personally reviewed they are unremarkable.  Patient's had slight rhonchi  in the left lower lobe clears after coughing, she had no other complaints.  Recommends symptom management, short course of antibiotics follow-up PCP as needed she is agreement this plan. Consultations Obtained:  N/A    Test Considered:  Troponins were deferred she is endorsing any chest pain no lightheaded dizziness near syncope or palpitations.    Rule out I have low suspicion for ACS as history is atypical,  EKG was sinus rhythm without signs of ischemia.   Low suspicion for PE as patient denies pleuritic chest pain, shortness of breath, patient denies leg pain, no pedal edema noted on exam, currently on anticoag's making this unlikely.  Low suspicion for systemic infection as patient is nontoxic-appearing, vital signs reassuring, no obvious source infection noted on exam.  Low suspicion for pneumonia as lung sounds are clear bilaterally, x-ray did not reveal any acute findings.  Low suspicion patient would need  hospitalized due to viral infection or Covid as vital signs reassuring, patient is not in respiratory distress.      Dispostion and problem list  After consideration of the  diagnostic results and the patients response to treatment, I feel that the patent would benefit from discharge.  URI-likely viral in nature but she has had this for over a week's time and she has a bit of rhonchi present my exam I am concerned for possible the beginning of a pneumonia  will start her on antibiotics follow-up PCP for further evaluation strict return precautions.            Final Clinical Impression(s) / ED Diagnoses Final diagnoses:  Acute cough    Rx / DC Orders ED Discharge Orders          Ordered    azithromycin (ZITHROMAX) 250 MG tablet  Daily        05/17/22 0106              Marcello Fennel, PA-C 94/85/46 2703    Delora Fuel, MD 50/09/38 [phone]

## 2022-05-17 NOTE — Discharge Instructions (Signed)
Likely a viral infection, recommend over-the-counter pain medications like ibuprofen Tylenol for fever and pain control, nasal decongestions like Flonase and Zyrtec, Mucinex for cough.  If not eating recommend supplementing with Gatorade to help with electrolyte supplementation.  I have started you on antibiotics please take as prescribed.  Follow-up PCP for further evaluation.  Come back to the emergency department if you develop chest pain, shortness of breath, severe abdominal pain, uncontrolled nausea, vomiting, diarrhea.

## 2022-05-26 ENCOUNTER — Other Ambulatory Visit (HOSPITAL_COMMUNITY): Payer: Self-pay

## 2022-06-02 ENCOUNTER — Other Ambulatory Visit (HOSPITAL_COMMUNITY): Payer: Self-pay

## 2022-06-08 IMAGING — CT CT RENAL STONE PROTOCOL
2 of 4 series · 16 of 46 positions shown, 18 images · non-contrast
Comparison: 07/07/2011

CLINICAL DATA: Left flank pain, hematuria

EXAM:
CT ABDOMEN AND PELVIS WITHOUT CONTRAST
TECHNIQUE: Multidetector CT imaging of the abdomen and pelvis was performed
following the standard protocol without IV contrast.

[Series 3: stone study 5.0 i30f 2 · axial · 0.97mm/px · z∈[+753,+1158]mm · 13 of 89 slices shown, 15 images]
[im 4/89  soft-tissue]
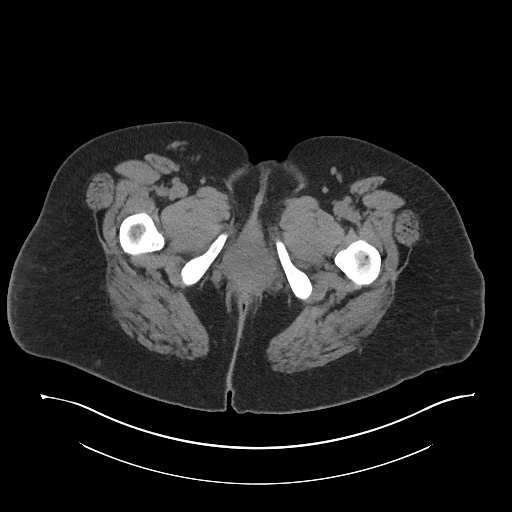
[im 4/89  bone]
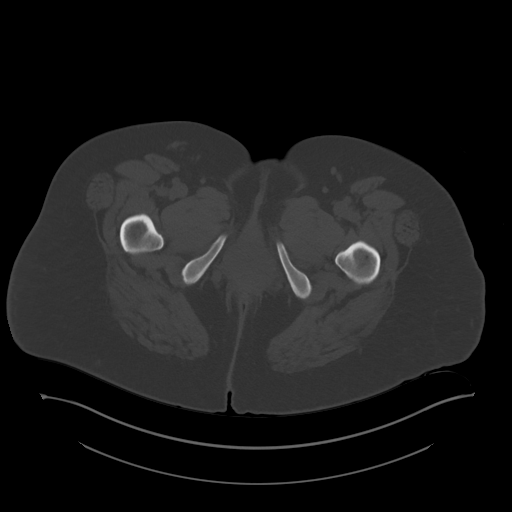
[im 12/89  soft-tissue]
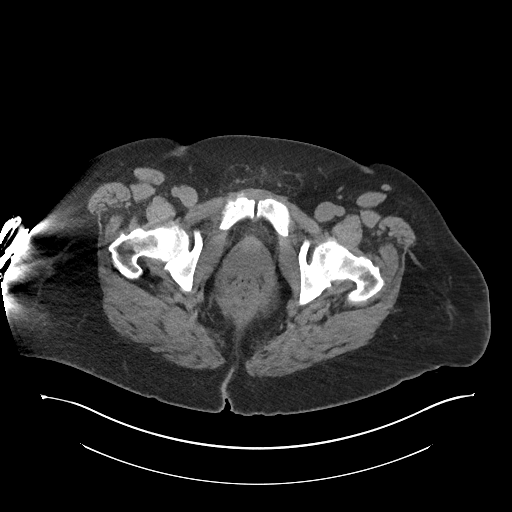
[im 19/89  soft-tissue]
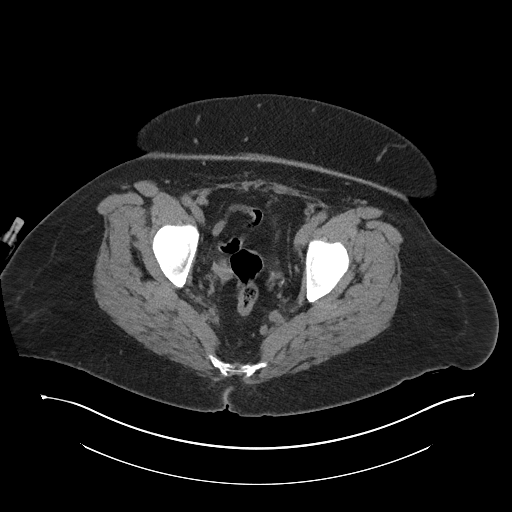
[im 26/89  soft-tissue]
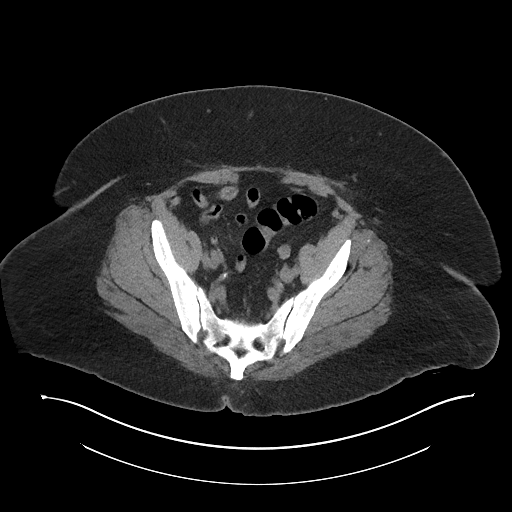
[im 30/89  soft-tissue]
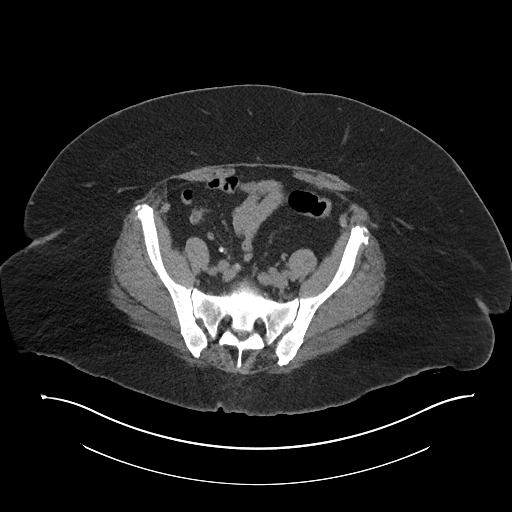
[im 37/89  soft-tissue]
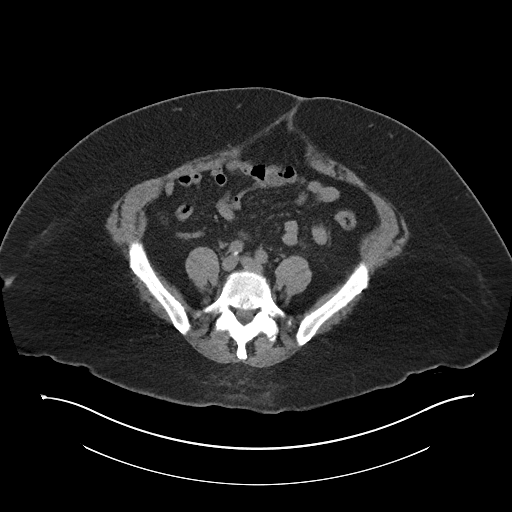
[im 45/89  soft-tissue]
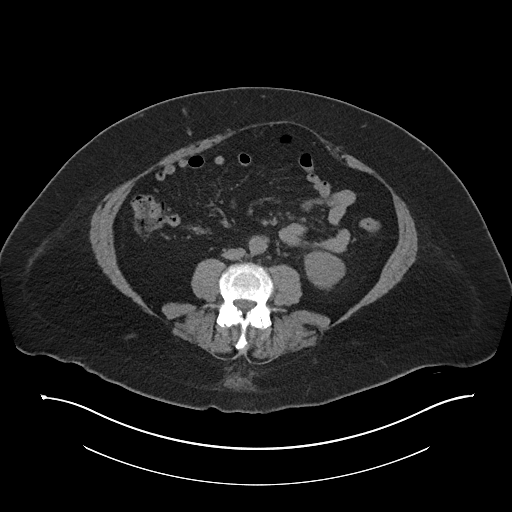
[im 52/89  soft-tissue]
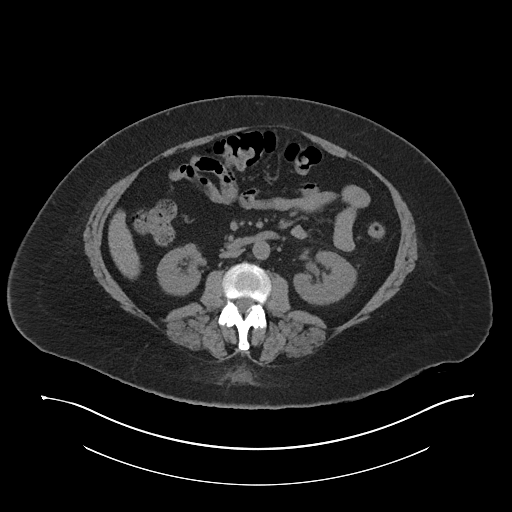
[im 59/89  soft-tissue]
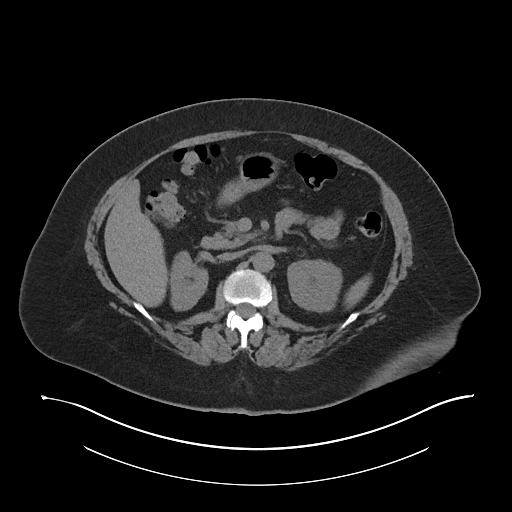
[im 59/89  bone]
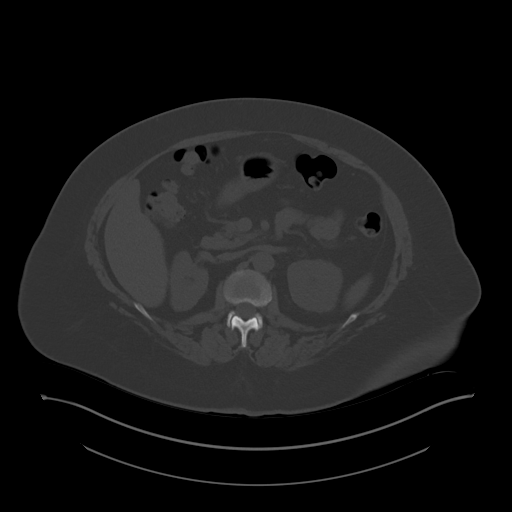
[im 63/89  soft-tissue]
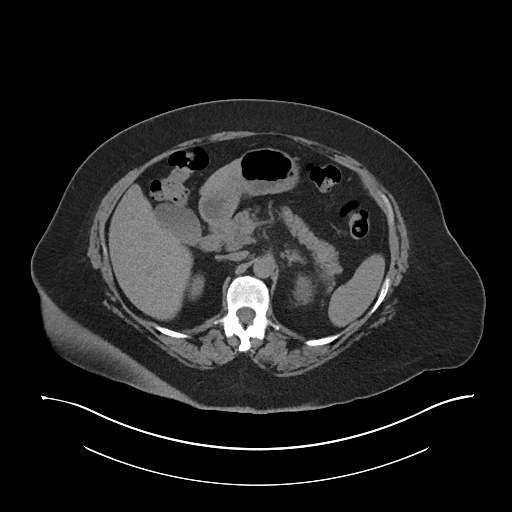
[im 70/89  soft-tissue]
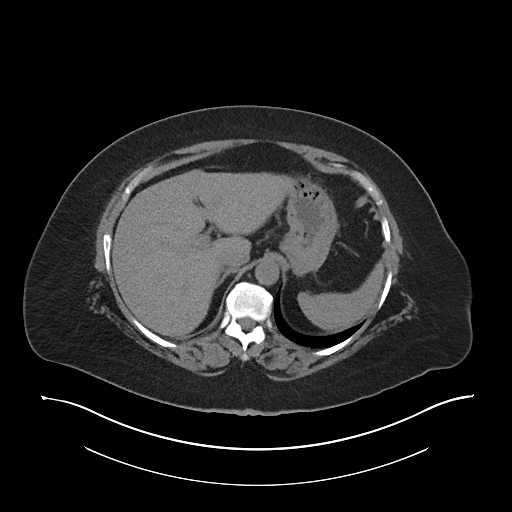
[im 78/89  soft-tissue]
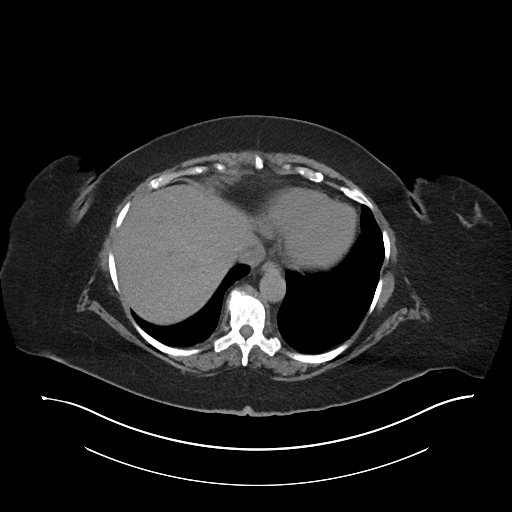
[im 85/89  soft-tissue]
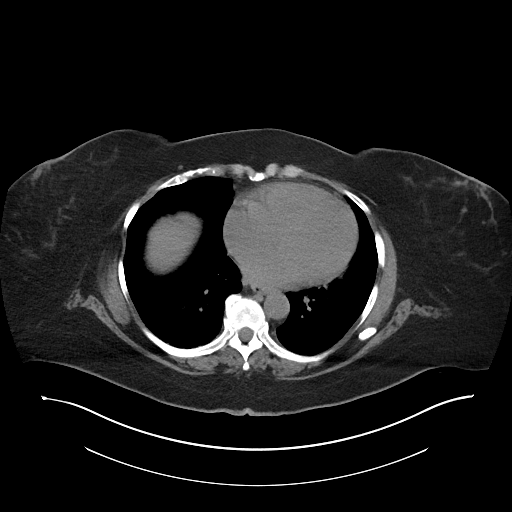

[Series 6: coronal soft tissue · coronal · 0.78mm/px · 3 of 114 slices shown]
[im 38/114  soft-tissue]
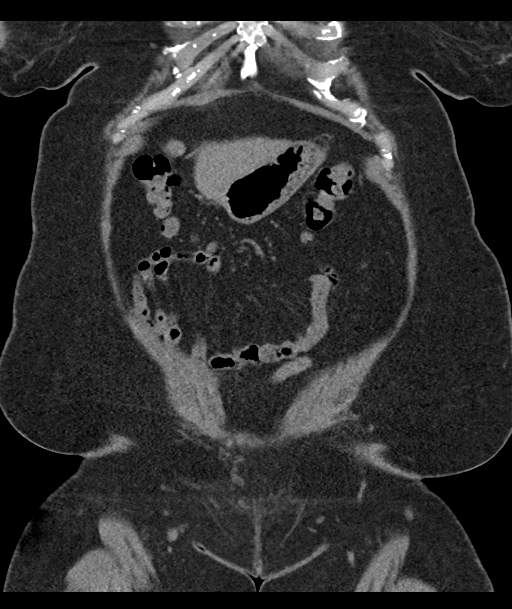
[im 51/114  soft-tissue]
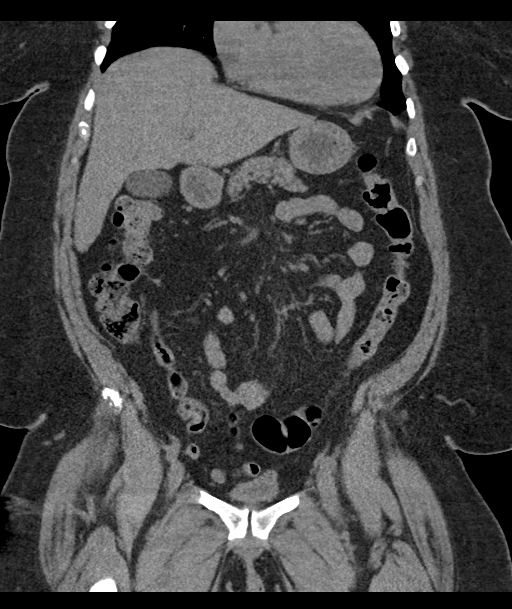
[im 63/114  soft-tissue]
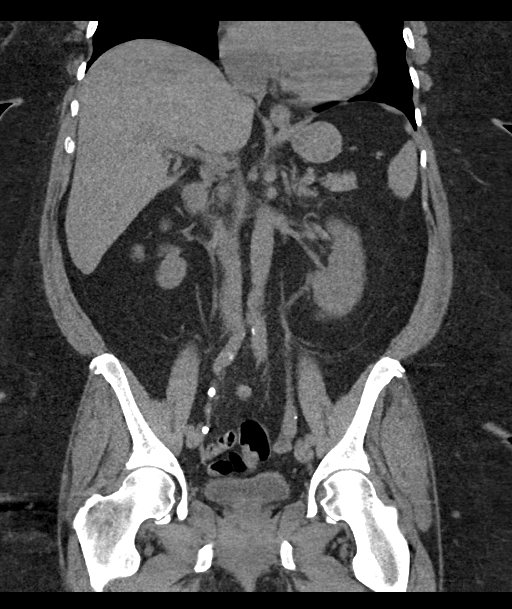

[16 of 46 positions shown; findings below may reference images not displayed]

FINDINGS: Lower chest: No acute abnormality.

Hepatobiliary: No focal liver abnormality is seen. No gallstones,
gallbladder wall thickening, or biliary dilatation.

Pancreas: Unremarkable. No pancreatic ductal dilatation or
surrounding inflammatory changes.

Spleen: Normal in size without focal abnormality.

Adrenals/Urinary Tract: Unremarkable adrenal glands. Unremarkable
noncontrast appearance of the bilateral kidneys. No focal lesion,
stone, or hydronephrosis. Bilateral ureters are nondilated. No
ureteral calculi. Urinary bladder appears within normal limits,
although evaluation is limited by under distension.

Stomach/Bowel: Stomach is within normal limits. Appendix appears
normal. Scattered colonic diverticulosis. No evidence of bowel wall
thickening, distention, or inflammatory changes.

Vascular/Lymphatic: Scattered atherosclerotic calcifications of the
bilateral iliac arteries. No aneurysm. No abdominopelvic
lymphadenopathy.

Reproductive: Status post hysterectomy. No adnexal masses.

Other: No free fluid. No abdominopelvic fluid collection. No
pneumoperitoneum.

Musculoskeletal: No acute or significant osseous findings.
IMPRESSION: 1. No acute abdominopelvic findings. Specifically, no evidence of
obstructive uropathy or findings to explain patient's hematuria.
2. Scattered colonic diverticulosis without evidence of acute
diverticulitis.

## 2022-06-28 ENCOUNTER — Other Ambulatory Visit (HOSPITAL_COMMUNITY): Payer: Self-pay

## 2022-06-28 MED ORDER — PEG 3350-KCL-NA BICARB-NACL 420 G PO SOLR
ORAL | 0 refills | Status: DC
  Filled 2022-06-28: qty 4000, 1d supply, fill #0

## 2022-07-25 ENCOUNTER — Other Ambulatory Visit: Payer: Self-pay | Admitting: Family

## 2022-07-25 ENCOUNTER — Other Ambulatory Visit (HOSPITAL_COMMUNITY): Payer: Self-pay

## 2022-07-25 ENCOUNTER — Other Ambulatory Visit: Payer: Self-pay | Admitting: Cardiovascular Disease

## 2022-07-25 DIAGNOSIS — I480 Paroxysmal atrial fibrillation: Secondary | ICD-10-CM

## 2022-07-25 MED ORDER — HYDROCHLOROTHIAZIDE 25 MG PO TABS
25.0000 mg | ORAL_TABLET | Freq: Every day | ORAL | 0 refills | Status: DC
  Filled 2022-07-25: qty 90, 90d supply, fill #0

## 2022-07-25 MED ORDER — APIXABAN 5 MG PO TABS
5.0000 mg | ORAL_TABLET | Freq: Two times a day (BID) | ORAL | 1 refills | Status: DC
  Filled 2022-07-25: qty 180, 90d supply, fill #0
  Filled 2022-10-27: qty 180, 90d supply, fill #1

## 2022-07-25 NOTE — Telephone Encounter (Signed)
Eliquis '5mg'$  refill request received. Patient is 66 years old, weight-108.9kg, Crea-0.82 on 11/04/2021, Diagnosis-Afib, and last seen by Almyra Deforest on 5/9/20223. Dose is appropriate based on dosing criteria. Will send in refill to requested pharmacy.

## 2022-07-25 NOTE — Telephone Encounter (Signed)
Prescription refill request for Eliquis received. Indication: Atrial Fib Last office visit: 05/02/22  Janan Ridge PA Scr: 0.82 on 11/04/21 Age: 66 Weight: 105.4kg  Based on above findings Eliquis '5mg'$  twice daily is the appropriate dose.  Refill approved.

## 2022-07-26 ENCOUNTER — Other Ambulatory Visit (HOSPITAL_COMMUNITY): Payer: Self-pay

## 2022-07-28 ENCOUNTER — Other Ambulatory Visit (HOSPITAL_COMMUNITY): Payer: Self-pay

## 2022-09-05 ENCOUNTER — Other Ambulatory Visit (HOSPITAL_COMMUNITY): Payer: Self-pay

## 2022-10-13 ENCOUNTER — Other Ambulatory Visit (HOSPITAL_COMMUNITY): Payer: Self-pay

## 2022-10-13 MED ORDER — ROSUVASTATIN CALCIUM 10 MG PO TABS
10.0000 mg | ORAL_TABLET | Freq: Every evening | ORAL | 3 refills | Status: DC
  Filled 2022-10-13: qty 90, 90d supply, fill #0
  Filled 2023-01-24: qty 90, 90d supply, fill #1
  Filled 2023-04-19: qty 90, 90d supply, fill #2
  Filled 2023-07-25: qty 90, 90d supply, fill #3

## 2022-10-13 MED ORDER — METOPROLOL SUCCINATE ER 100 MG PO TB24
100.0000 mg | ORAL_TABLET | Freq: Every day | ORAL | 1 refills | Status: DC
  Filled 2022-10-13 – 2022-11-25 (×2): qty 90, 90d supply, fill #0
  Filled 2023-01-25: qty 90, 90d supply, fill #1

## 2022-10-13 MED ORDER — HYDRALAZINE HCL 25 MG PO TABS
25.0000 mg | ORAL_TABLET | Freq: Three times a day (TID) | ORAL | 0 refills | Status: DC
  Filled 2022-10-13: qty 30, 10d supply, fill #0

## 2022-10-14 ENCOUNTER — Other Ambulatory Visit (HOSPITAL_COMMUNITY): Payer: Self-pay

## 2022-10-16 ENCOUNTER — Other Ambulatory Visit (HOSPITAL_COMMUNITY): Payer: Self-pay

## 2022-10-23 ENCOUNTER — Other Ambulatory Visit (HOSPITAL_COMMUNITY): Payer: Self-pay

## 2022-10-27 ENCOUNTER — Other Ambulatory Visit (HOSPITAL_COMMUNITY): Payer: Self-pay

## 2022-10-27 ENCOUNTER — Other Ambulatory Visit: Payer: Self-pay | Admitting: Cardiovascular Disease

## 2022-10-27 MED ORDER — HYDROCHLOROTHIAZIDE 25 MG PO TABS
25.0000 mg | ORAL_TABLET | Freq: Every day | ORAL | 1 refills | Status: DC
  Filled 2022-10-27: qty 90, 90d supply, fill #0
  Filled 2023-01-24: qty 90, 90d supply, fill #1

## 2022-11-08 ENCOUNTER — Other Ambulatory Visit (HOSPITAL_COMMUNITY): Payer: Self-pay

## 2022-11-11 NOTE — Progress Notes (Signed)
HPI female never smoker followed for OSA, complicated by atrial fib/Coumadin, GERD, HBP NPSG 02/11/17      AHI 30.9/ hr, desaturation to 85%, CPAP titrated to 10 cwp,    Body weight 258 lbs  ---------------------------------------------------------------------------- 02/19/18- 66 year old female never smoker followed for OSA, complicated by atrial fib/Coumadin, GERD, HBP CPAP auto 5-20/ [facility]     OSA: DME: [facility]. Pt uses CPAP about 3.5 hours each night-will need new supplies. DL attached.  Download 40% compliance with four hour goal but she is wearing it every night, average 3 hours 42 minutes.  AHI 1.1/hour.  She is taking mask off in her sleep.  She would like larger nasal pillows. She does recognize she is sleeping better with CPAP but still sleepy in the daytime.  Reports bedtime between midnight and 1 AM, getting up between 5 and 6 AM.  I explained this is not enough sleep time for her.  10/17/2018- 66 year old female never smoker followed for OSA, complicated by atrial fib/Coumadin, GERD, HBP CPAP auto 5-20/ [facility] -----Follows For: OSA, doing well on CPAP, sneezing, allergy flare up possible, recieved flu shot from Dr. Nancy Fetter Body weight today 262 pounds BP 160/84 She thinks she has been using CPAP at least 4 hours per night.  We reviewed download which shows compliance 50% for our goal, AHI 0.8/hour.  She says it is comfortable but she needs a new nasal pillows mask.  Admits to some nasal stuffiness which is persistent, without much sneezing or drainage.  11/13/22- Coming to re-establish- 66 year old female never smoker followed for OSA, complicated by Atrial Fib/eliquis, GERD, HTN, Morbid Obesity,  CPAP auto 5-20/ [facility] Download compliance- 0%, AHI 3.5/ hr Body weight today-244 lbs Covid vax- Flu vax- Complains CPAP makes her nose burn. We discussed humidification and nasal saline. Due for replacement machine- should give [place] a chancre to regroup.  ROS-see HPI   + =  positive Constitutional:    weight loss, night sweats, fevers, chills, +fatigue, lassitude. HEENT:    headaches, difficulty swallowing, tooth/dental problems, sore throat,       sneezing, itching, ear ache, +nasal congestion, post nasal drip, snoring CV:    chest pain, orthopnea, PND, swelling in lower extremities, anasarca,                                                            dizziness, palpitations Resp:   shortness of breath with exertion or at rest.                productive cough,   non-productive cough, coughing up of blood.              change in color of mucus.  wheezing.   Skin:    rash or lesions. GI:  No-   heartburn, indigestion, abdominal pain, nausea, vomiting, diarrhea,                 change in bowel habits, loss of appetite GU: dysuria, change in color of urine, no urgency or frequency.   flank pain. MS:   joint pain, stiffness, decreased range of motion, back pain. Neuro-     nothing unusual Psych:  change in mood or affect.  depression or anxiety.   memory loss.  OBJ- Physical Exam General- Alert, Oriented, Affect-appropriate, Distress- none acute, +  morbidly obese  Skin- rash-none, lesions- none, excoriation- none Lymphadenopathy- none Head- atraumatic            Eyes- Gross vision intact, PERRLA, conjunctivae and secretions clear            Ears- Hearing, canals-normal            Nose-+ turbinate edema, no-Septal dev, mucus, polyps, erosion, perforation             Throat- Mallampati II-III , mucosa clear , drainage- none, tonsils- atrophic, has lower teeth, +upper full denture Neck- flexible , trachea midline, no stridor , thyroid nl, carotid no bruit+ Chest - symmetrical excursion , unlabored           Heart/CV- IRR + , no murmur , no gallop  , no rub, nl s1 s2                           - JVD- none , edema- none, stasis changes- none, varices- none           Lung- clear to P&A, wheeze- none, cough- none , dullness-none, rub- none           Chest wall-  Abd-   Br/ Gen/ Rectal- Not done, not indicated Extrem- cyanosis- none, clubbing, none, atrophy- none, strength- nl Neuro- grossly intact to observation

## 2022-11-13 ENCOUNTER — Ambulatory Visit (INDEPENDENT_AMBULATORY_CARE_PROVIDER_SITE_OTHER): Payer: Medicaid Other | Admitting: Internal Medicine

## 2022-11-13 ENCOUNTER — Encounter: Payer: Self-pay | Admitting: Internal Medicine

## 2022-11-13 VITALS — BP 124/88 | HR 88 | Ht 63.5 in | Wt 244.4 lb

## 2022-11-13 DIAGNOSIS — G4733 Obstructive sleep apnea (adult) (pediatric): Secondary | ICD-10-CM | POA: Diagnosis not present

## 2022-11-13 NOTE — Patient Instructions (Signed)
Order- DME [facility]/ [facility]- please replace old CPAP machine, auto 5-20, mask of choice, humidifier, supplies, AirView/ card  You can adjust the humidifier on your CPAP to help with dryness  Try otc nasal saline GEL for dry nose

## 2022-11-19 ENCOUNTER — Encounter: Payer: Self-pay | Admitting: Internal Medicine

## 2022-11-19 NOTE — Assessment & Plan Note (Signed)
Educated on comfort adjustments and compliance goals. Plan- replace old machine auto 5-20, adjust humidifier and add nasal saline gel

## 2022-11-19 NOTE — Assessment & Plan Note (Signed)
Reviewed weight goals as important to OSA control

## 2022-11-25 ENCOUNTER — Other Ambulatory Visit (HOSPITAL_COMMUNITY): Payer: Self-pay

## 2022-12-05 ENCOUNTER — Encounter: Payer: Self-pay | Admitting: Cardiovascular Disease

## 2022-12-11 NOTE — Progress Notes (Unsigned)
Office Visit    Patient Name: Jacqueline Petersen Date of Encounter: 12/12/2022  PCP:  Donald Prose, [place]  Cardiologist:  [name], MD  Advanced Practice Provider:  No care team member to display Electrophysiologist:  None   HPI    Jacqueline Petersen is a 66 y.o. female.  Past medical history significant for hypertension, hyperlipidemia, paroxysmal atrial fibrillation previously on Coumadin, GERD, and obesity presents today for follow-up appointment.  History includes LHC 03/2007 with normal coronary arteries, EF 50 to 55%.  Patient was admitted 12/10 through 12/13 of 2021 presenting with palpitations.  Found to be in atrial fibrillation with RVR.  Converted normal sinus rhythm on IV diltiazem.  It was felt she would need long-term anticoagulation.  CHA2DS2-VASc score equal to 2.  She was placed on Coumadin.  Echocardiogram 12/04/2013 showed moderate LVH, EF 50 to 55%, normal wall motion.  She was last seen 11/21/2021 and had been doing well.  She denied any rapid palpitations she was compliant with her CPAP.  Blood pressure was elevated over Thanksgiving likely due to sodium intake.  Today, she shares she has been under a lot of stress with a family situation and that is likely why her BP has been elevated lately. She takes it at home and its been as high as 962 systolic. She is out of her hydralazine which she  was taking as needed for BP > 952 systolic. She has been working on walking daily and making healthier food choices. She otherwise feels okay today without any chest pain or SOB. No fluttering in her chest and no episodes of afib to her knowledge.   Reports no shortness of breath nor dyspnea on exertion. Reports no chest pain, pressure, or tightness. No edema, orthopnea, PND. Reports no palpitations.   Past Medical History    Past Medical History:  Diagnosis Date   A-fib (HCC)    Acid reflux    Arthritis    Dyslipidemia     Fatigue    GERD (gastroesophageal reflux disease)    History of hysterectomy    Hx of echocardiogram    Echocardiogram (12/15): EF 55-60%, normal wall motion, PASP 32 mmHg   Hyperlipidemia    Hypertension    Hypokalemia    Obesity    Personal history of hypertensive heart disease    Personal history of long-term (current) use of anticoagulants    Shingles    Past Surgical History:  Procedure Laterality Date   ABDOMINAL HYSTERECTOMY     CARDIAC CATHETERIZATION  04/15/2007   Est. EF of 50-55% -- Smooth and normal coronary arteries -- Normal left ventricular systolic function.  We will continue with further treatment of her atrial fibrillation      PARTIAL HYSTERECTOMY      Allergies  Allergies  Allergen Reactions   Lipitor [Atorvastatin Calcium] Swelling   Spironolactone Swelling    Facial and lip swelling/denies respiratory distress   Ace Inhibitors     Other reaction(s): cough?   Losartan Potassium     Other reaction(s): runny nose   Omeprazole     Other reaction(s): Unknown   EKGs/Labs/Other Studies Reviewed:   The following studies were reviewed today:  Echocardiogram 01/10/16  Study Conclusions   - Left ventricle: The cavity size was normal. Wall thickness was    normal. Systolic function was normal. The estimated ejection    fraction was in the range of 55% to 60%. Wall motion was  normal;    there were no regional wall motion abnormalities.  - Left atrium: The atrium was moderately dilated.  - Atrial septum: No defect or patent foramen ovale was identified.   Transthoracic echocardiography.  M-mode, complete 2D, spectral  Doppler, and color Doppler.  Birthdate:  Patient birthdate:  [birth year].  Age:  Patient is 66 yr old.  Sex:  Gender: female.  BMI: 43.3 kg/m^2.  Blood pressure:     112/70  Patient status:  Outpatient.  Study date:  Study date: 01/10/2016. Study time: 08:53  AM.  Location:  [facility]    -------------------------------------------------------------------   -------------------------------------------------------------------  Left ventricle:  The cavity size was normal. Wall thickness was  normal. Systolic function was normal. The estimated ejection  fraction was in the range of 55% to 60%. Wall motion was normal;  there were no regional wall motion abnormalities.   -------------------------------------------------------------------  Aortic valve:   Trileaflet; normal thickness leaflets. Mobility was  not restricted.  Doppler:  Transvalvular velocity was within the  normal range. There was no stenosis. There was no regurgitation.    -------------------------------------------------------------------  Aorta: The aorta was normal, not dilated, and non-diseased. Aortic  root: The aortic root was normal in size.   -------------------------------------------------------------------  Mitral valve:   Mildly thickened leaflets . Mobility was not  restricted.  Doppler:  Transvalvular velocity was within the normal  range. There was no evidence for stenosis. There was trivial  regurgitation.    Peak gradient (D): 5 mm Hg.   -------------------------------------------------------------------  Left atrium:  The atrium was moderately dilated.   -------------------------------------------------------------------  Atrial septum:  No defect or patent foramen ovale was identified.    -------------------------------------------------------------------  Right ventricle:  The cavity size was normal. Wall thickness was  normal. Systolic function was normal.   -------------------------------------------------------------------  Pulmonic valve:    Doppler:  Transvalvular velocity was within the  normal range. There was no evidence for stenosis. There was trivial  regurgitation.   -------------------------------------------------------------------  Tricuspid valve:   Structurally  normal valve.    Doppler:  Transvalvular velocity was within the normal range. There was mild  regurgitation.   -------------------------------------------------------------------  Pulmonary artery:   The main pulmonary artery was normal-sized.  Systolic pressure was within the normal range.   -------------------------------------------------------------------  Right atrium:  The atrium was normal in size.   -------------------------------------------------------------------  Pericardium: The pericardium was normal in appearance. There was  no pericardial effusion.   -------------------------------------------------------------------  Systemic veins:  Inferior vena cava: The vessel was normal in size. The  respirophasic diameter changes were in the normal range (>= 50%),  consistent with normal central venous pressure.   -------------------------------------------------------------------  Post procedure conclusions  Ascending Aorta:   - The aorta was normal, not dilated, and non-diseased.   EKG:  EKG is not ordered today.   Recent Labs: No results found for requested labs within last 365 days.  Recent Lipid Panel    Component Value Date/Time   CHOL 186 12/02/2010 2013   TRIG 128 12/02/2010 2013   HDL 46 12/02/2010 2013   CHOLHDL 4.0 Ratio 12/02/2010 2013   VLDL 26 12/02/2010 2013   LDLCALC 114 (H) 12/02/2010 2013    Risk Assessment/Calculations:   CHA2DS2-VASc Score = 2   This indicates a 2.2% annual risk of stroke. The patient's score is based upon: CHF History: 0 HTN History: 1 Diabetes History: 0 Stroke History: 0 Vascular Disease History: 0 Age Score: 0 Gender Score: 1  Home Medications   Current Meds  Medication Sig   apixaban (ELIQUIS) 5 MG TABS tablet Take 1 tablet by mouth 2  times daily.   Cholecalciferol (VITAMIN D) 2000 UNITS CAPS Take 2,000 Units by mouth every evening.   ferrous sulfate 325 (65 FE) MG EC tablet Take 1 tablet by mouth  once a day   hydrochlorothiazide (HYDRODIURIL) 25 MG tablet Take 1 tablet (25 mg total) by mouth daily.   metoprolol succinate (TOPROL-XL) 100 MG 24 hr tablet Take 1 tablet (100 mg total) by mouth daily.   Multiple Vitamins-Minerals (WOMENS 50+ MULTI VITAMIN) TABS Take 1 tablet by mouth daily.   Potassium Chloride ER 20 MEQ TBCR Take 1 tablet by mouth daily with food   rosuvastatin (CRESTOR) 10 MG tablet Take 1 tablet (10 mg total) by mouth every evening.   [DISCONTINUED] hydrALAZINE (APRESOLINE) 25 MG tablet Take 1 tablet (25 mg total) by mouth with food 3 times daily--only as needed if BP over 150     Review of Systems      All other systems reviewed and are otherwise negative except as noted above.  Physical Exam    VS:  BP (!) 145/80   Pulse 66   Ht 5' 3.5" (1.613 m)   Wt 247 lb 3.2 oz (112.1 kg)   SpO2 98%   BMI 43.10 kg/m  , BMI Body mass index is 43.1 kg/m.  Wt Readings from Last 3 Encounters:  12/12/22 247 lb 3.2 oz (112.1 kg)  11/13/22 244 lb 6.4 oz (110.9 kg)  05/16/22 240 lb (108.9 kg)     GEN: Well nourished, well developed, in no acute distress. HEENT: normal. Neck: Supple, no JVD, carotid bruits, or masses. Cardiac: RRR, no murmurs, rubs, or gallops. No clubbing, cyanosis, edema.  Radials/PT 2+ and equal bilaterally.  Respiratory:  Respirations regular and unlabored, clear to auscultation bilaterally. GI: Soft, nontender, nondistended. MS: No deformity or atrophy. Skin: Warm and dry, no rash. Neuro:  Strength and sensation are intact. Psych: Normal affect.  Assessment & Plan    Hypertension -repeat still elevated at 145/80 -will add hydralazine '25mg'$  TID, suspect she will need titration -I have asked her to keep track of her BP daily an hour after morning medications for the next two weeks and send [place] those values -low sodium diet  Hyperlipidemia -LDL 80  -monitoring per PCP -continue Crestor  Paroxysmal atrial fibrillation -no recent issues, she is  in NSR today -continue Eliquis for stroke prevention   Obesity -we discussed daily walks as well as portion control -healthy food choices especially during the holidays  Obstructive sleep apnea -new machine a few weeks ago and it is working well -She has been compliant  HYPERTENSION CONTROL Vitals:   12/12/22 0814 12/12/22 0839  BP: (!) 160/88 (!) 145/80    The patient's blood pressure is elevated above target today.  In order to address the patient's elevated BP: A new medication was prescribed today.       Disposition: Follow up 1 year with [name], MD or APP.  Signed, Elgie Collard, PA-C 12/12/2022, 8:47 AM [place]

## 2022-12-12 ENCOUNTER — Ambulatory Visit: Payer: Medicaid Other | Attending: Physician Assistant | Admitting: Physician Assistant

## 2022-12-12 ENCOUNTER — Encounter: Payer: Self-pay | Admitting: Physician Assistant

## 2022-12-12 ENCOUNTER — Other Ambulatory Visit (HOSPITAL_COMMUNITY): Payer: Self-pay

## 2022-12-12 VITALS — BP 145/80 | HR 66 | Ht 63.5 in | Wt 247.2 lb

## 2022-12-12 DIAGNOSIS — E785 Hyperlipidemia, unspecified: Secondary | ICD-10-CM | POA: Diagnosis not present

## 2022-12-12 DIAGNOSIS — G4733 Obstructive sleep apnea (adult) (pediatric): Secondary | ICD-10-CM | POA: Diagnosis not present

## 2022-12-12 DIAGNOSIS — I480 Paroxysmal atrial fibrillation: Secondary | ICD-10-CM

## 2022-12-12 DIAGNOSIS — I10 Essential (primary) hypertension: Secondary | ICD-10-CM | POA: Diagnosis not present

## 2022-12-12 MED ORDER — HYDRALAZINE HCL 25 MG PO TABS
25.0000 mg | ORAL_TABLET | Freq: Three times a day (TID) | ORAL | 3 refills | Status: DC
  Filled 2022-12-12: qty 270, 90d supply, fill #0
  Filled 2023-04-19: qty 270, 90d supply, fill #1
  Filled 2023-07-09 – 2023-07-25 (×2): qty 270, 90d supply, fill #2
  Filled 2023-10-26: qty 270, 90d supply, fill #3

## 2022-12-12 NOTE — Patient Instructions (Signed)
Medication Instructions:  1.Take hydralazine 25 mg three times a day *If you need a refill on your cardiac medications before your next appointment, please call your pharmacy*   Lab Work: None If you have labs (blood work) drawn today and your tests are completely normal, you will receive your results only by: [place] (if you have MyChart) OR A paper copy in the mail If you have any lab test that is abnormal or we need to change your treatment, we will call you to review the results.   Follow-Up: At [facility], you and your health needs are our priority.  As part of our continuing mission to provide you with exceptional heart care, we have created designated Provider Care Teams.  These Care Teams include your primary Cardiologist (physician) and Advanced Practice Providers (APPs -  Physician Assistants and Nurse Practitioners) who all work together to provide you with the care you need, when you need it.   Your next appointment:   1 year(s)  The format for your next appointment:   In Person  Provider:   [name], MD   Other Instructions Check your blood pressure daily, one hour after taking your morning medications for the next 2 weeks, keep a log and send [place] the readings through mychart.  Important Information About Sugar

## 2022-12-13 ENCOUNTER — Ambulatory Visit: Payer: Medicaid Other | Admitting: Cardiovascular Disease

## 2023-01-06 IMAGING — CR DG CHEST 2V
2 series · 2 of 2 positions shown · non-contrast
Comparison: Chest radiograph dated 10/12/2016

CLINICAL DATA: Chest pain

EXAM:
CHEST - 2 VIEW

[chest pa]
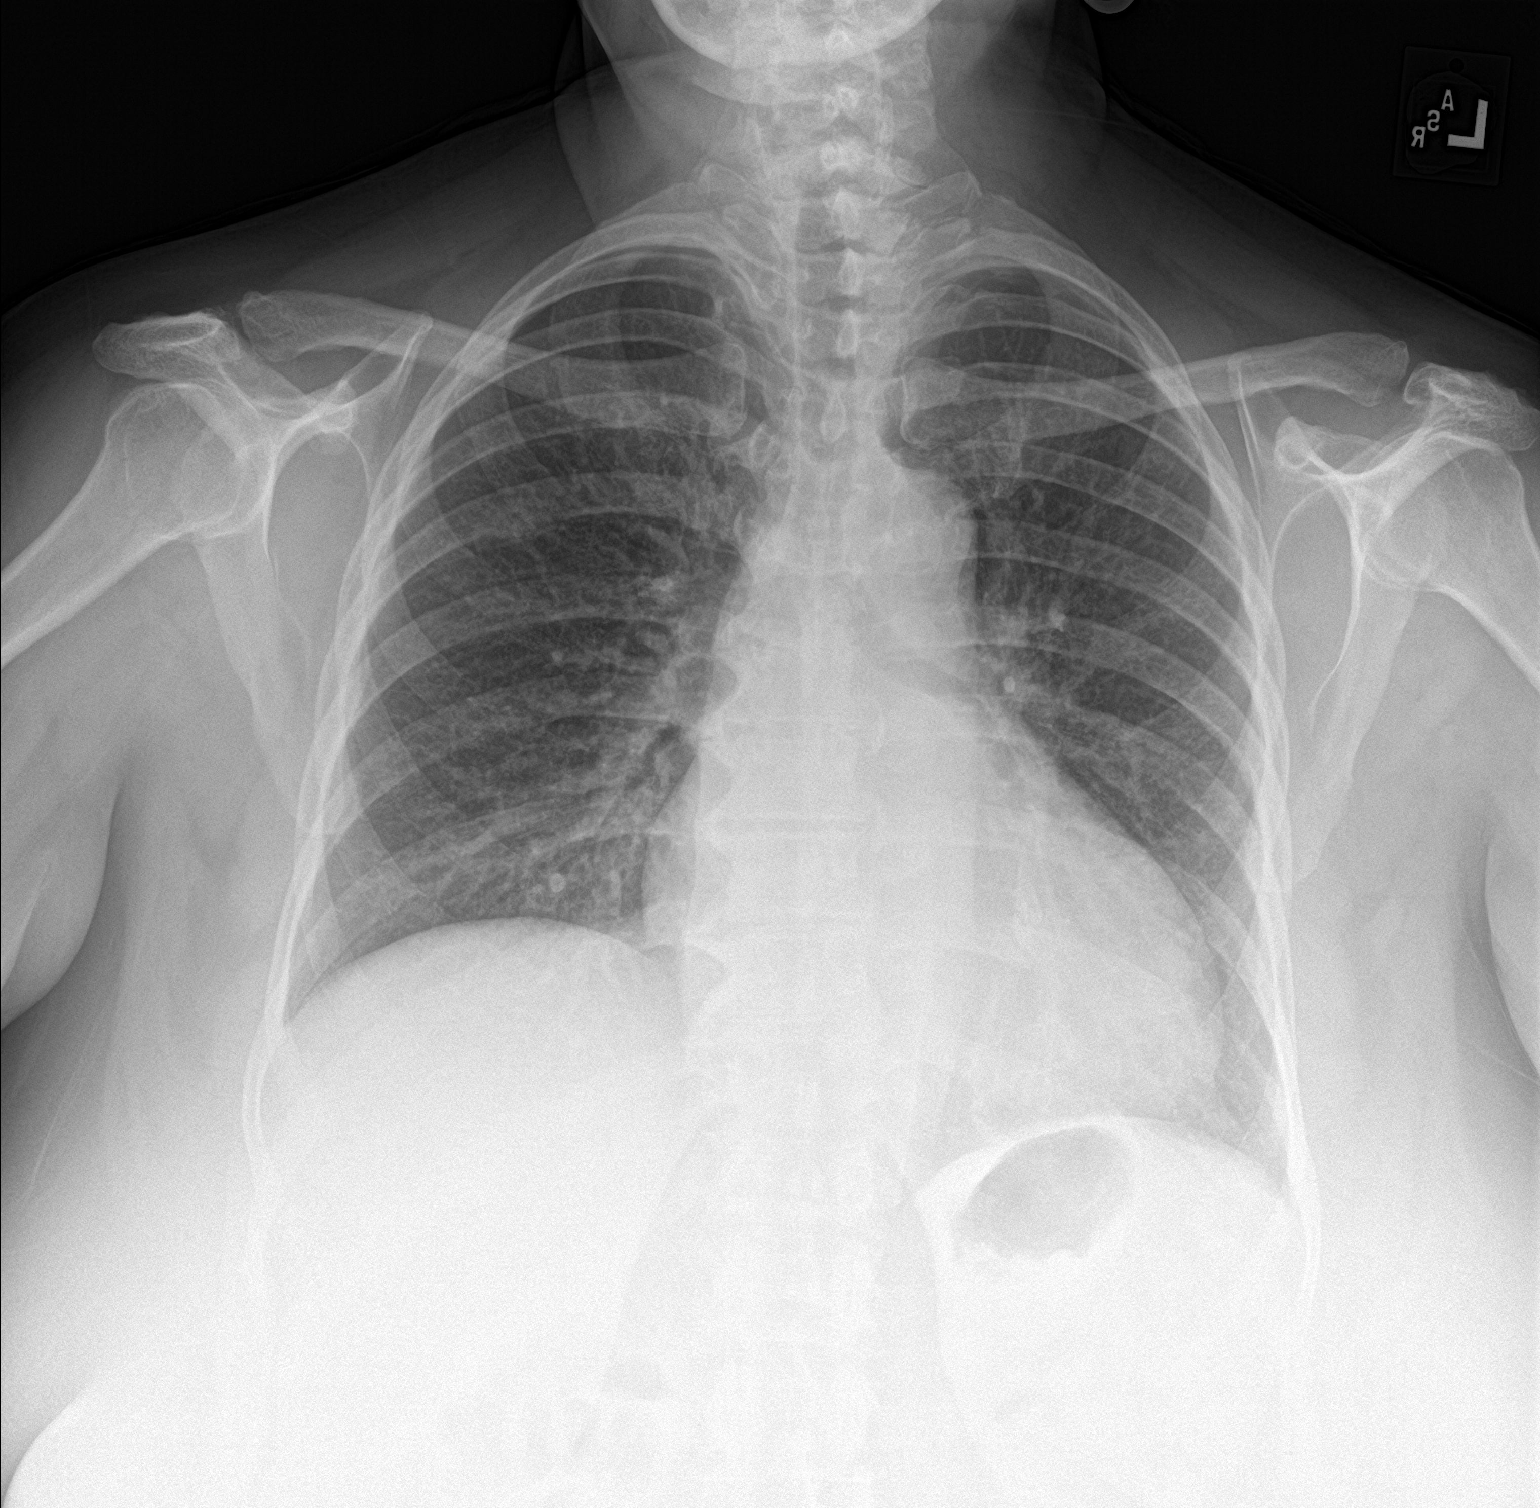

[chest lat]
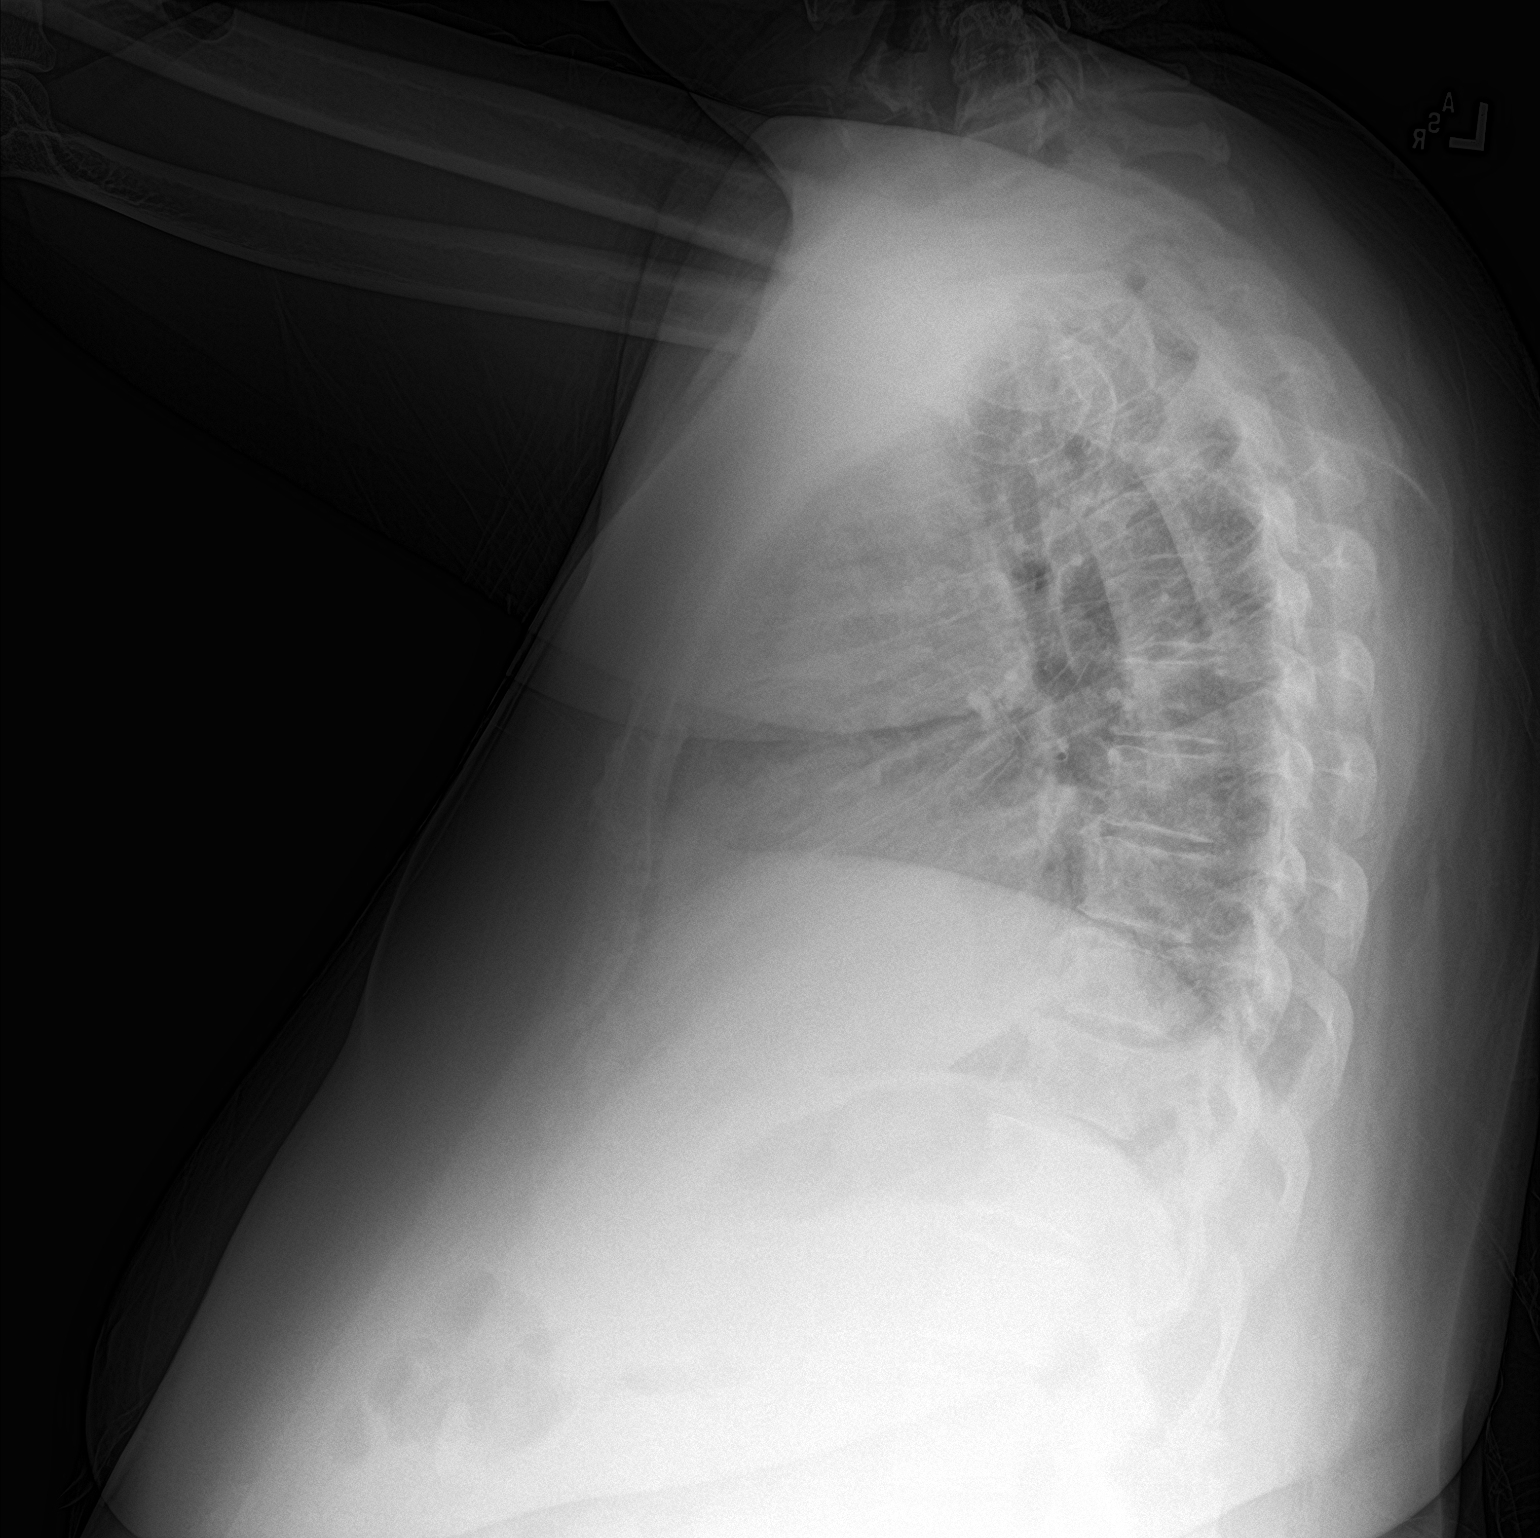

[2 of 2 positions shown; findings below may reference images not displayed]

FINDINGS: The heart size is borderline enlarged. The mediastinal contours are
within normal limits. Both lungs are clear. The visualized skeletal
structures are unremarkable.
IMPRESSION: Borderline cardiomegaly. No active cardiopulmonary disease.

## 2023-01-06 IMAGING — CT CT HEAD W/O CM
3 series · 16 of 47 positions shown, 19 images · non-contrast
Comparison: Head CT dated 08/03/2019.

CLINICAL DATA: 65-year-old female with concern for intracranial
hemorrhage.

EXAM:
CT HEAD WITHOUT CONTRAST
TECHNIQUE: Contiguous axial images were obtained from the base of the skull
through the vertex without intravenous contrast.

[Series 4: head 5.0 h30s · axial · 0.44mm/px · z∈[-66,+69]mm · 10 of 33 slices shown, 13 images]
[im 3/33  brain]
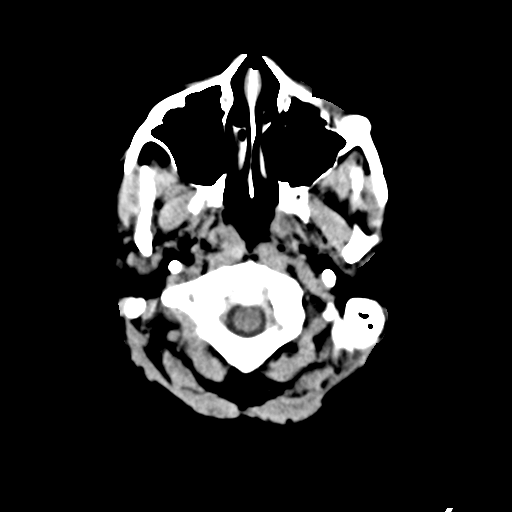
[im 3/33  bone]
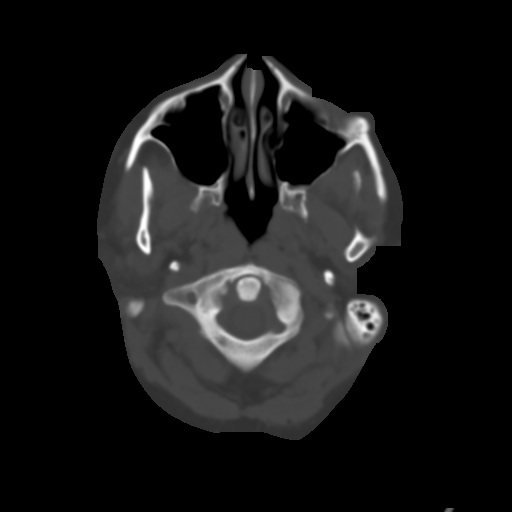
[im 6/33  brain]
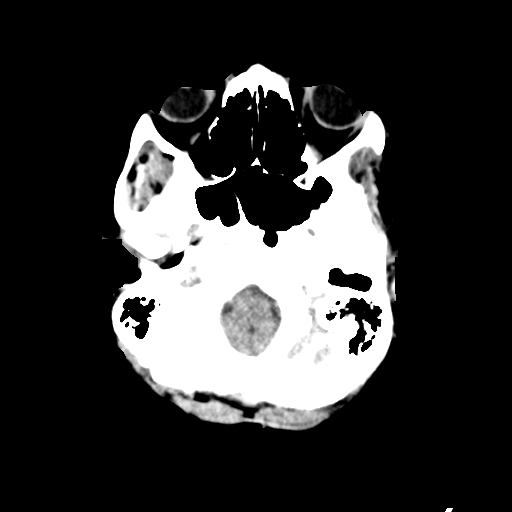
[im 9/33  brain]
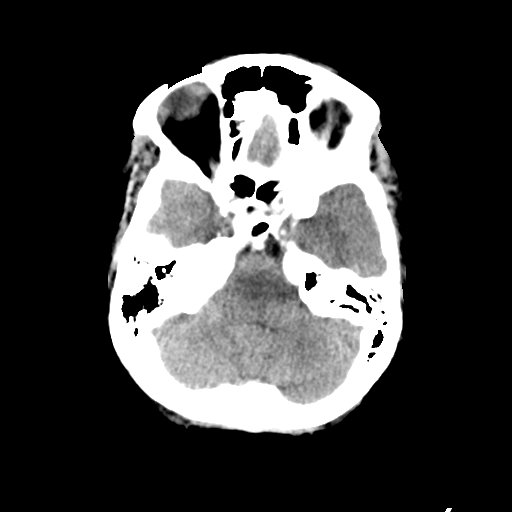
[im 12/33  brain]
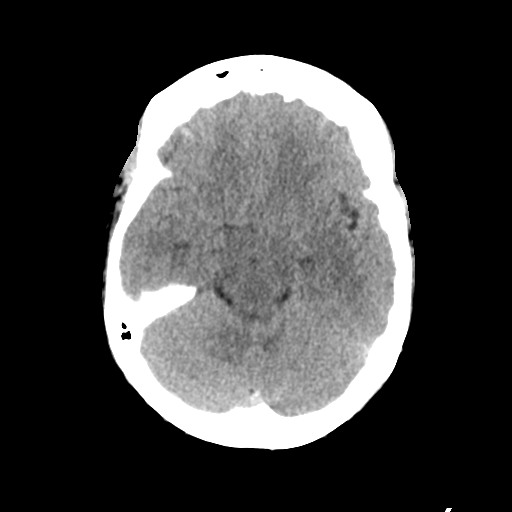
[im 15/33  brain]
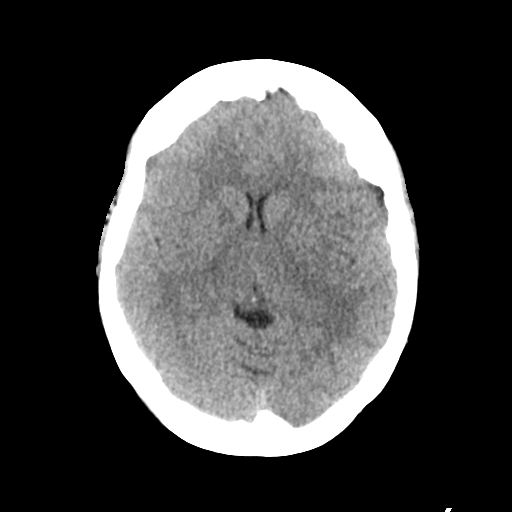
[im 15/33  bone]
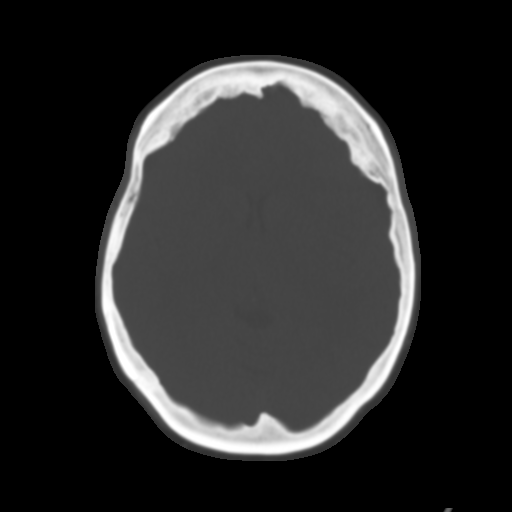
[im 18/33  brain]
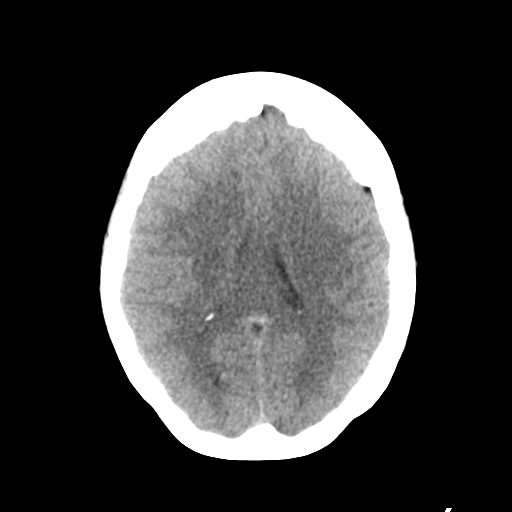
[im 21/33  brain]
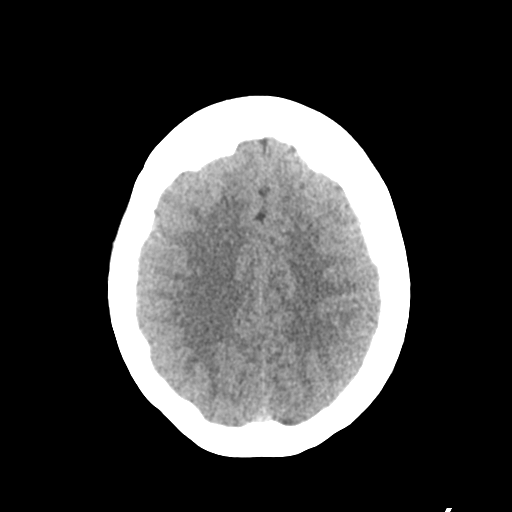
[im 25/33  brain]
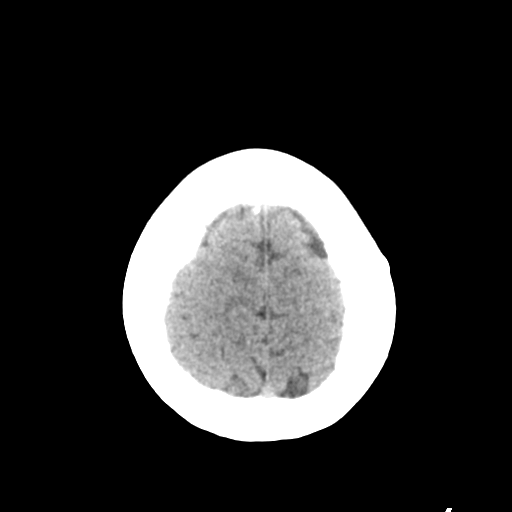
[im 27/33  brain]
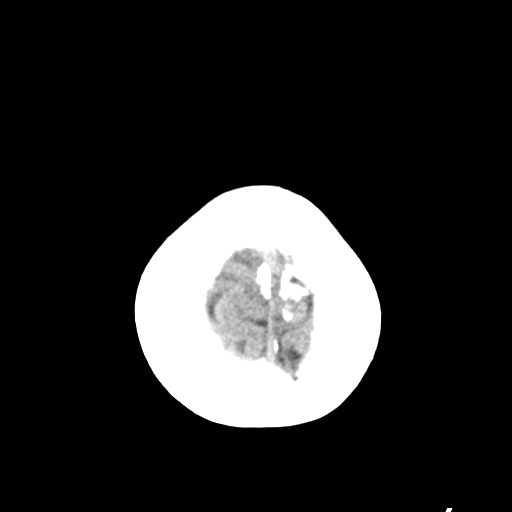
[im 27/33  bone]
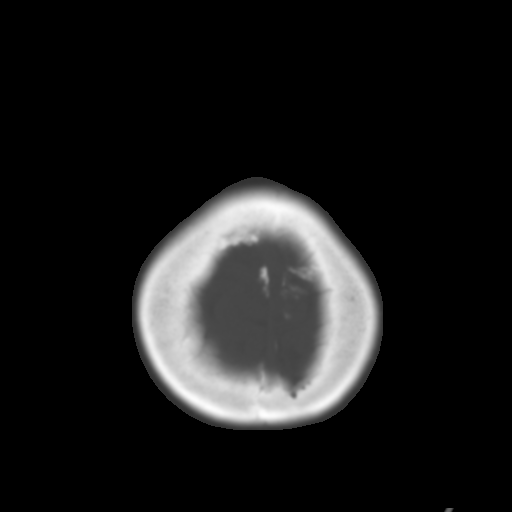
[im 30/33  brain]
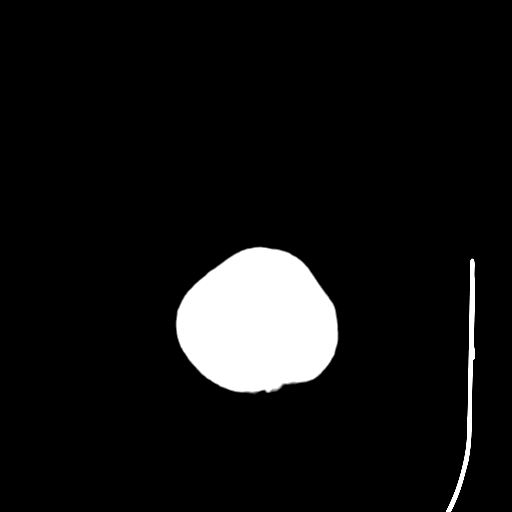

[Series 5: head 3.0 mpr cor · coronal · 0.32mm/px · 3 of 62 slices shown]
[im 21/62  brain]
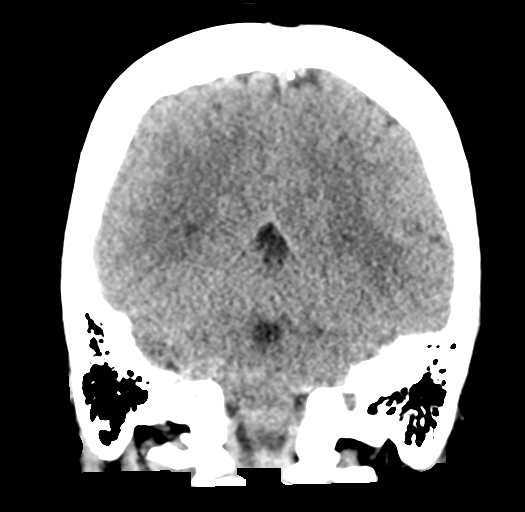
[im 28/62  brain]
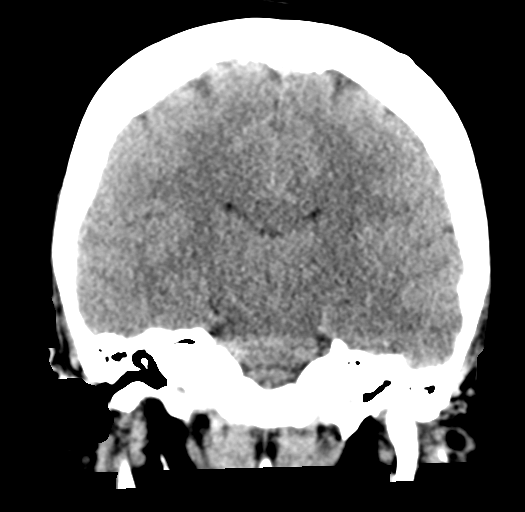
[im 34/62  brain]
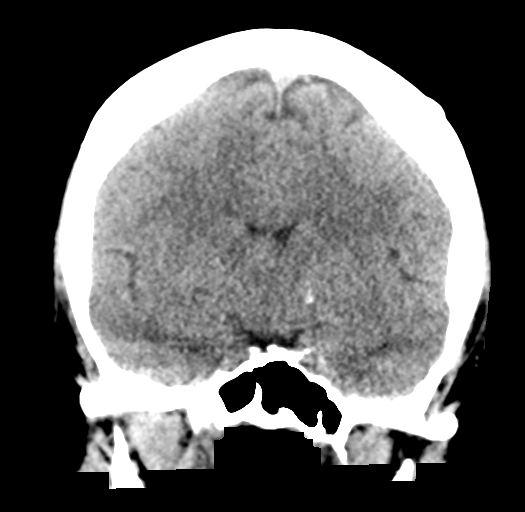

[Series 6: head 3.0 mpr sag · sagittal · 0.34mm/px · 3 of 53 slices shown]
[im 18/53  brain]
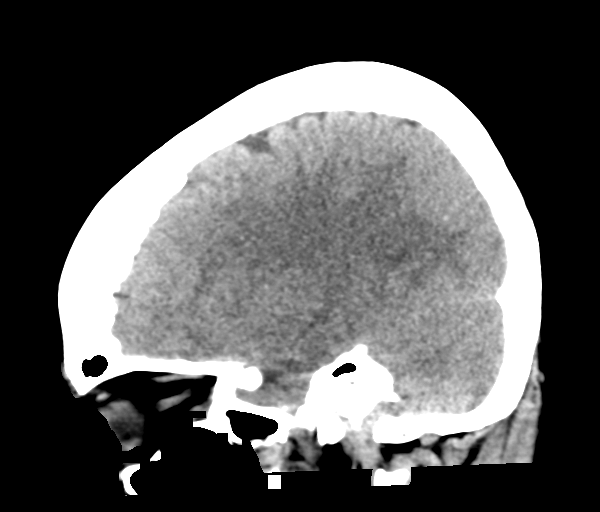
[im 27/53  brain]
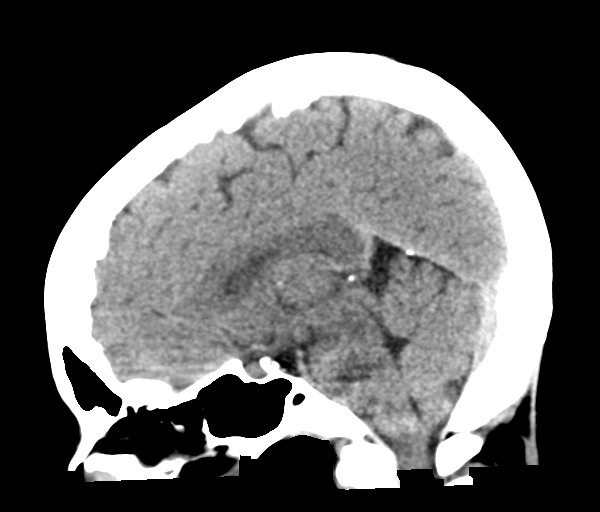
[im 35/53  brain]
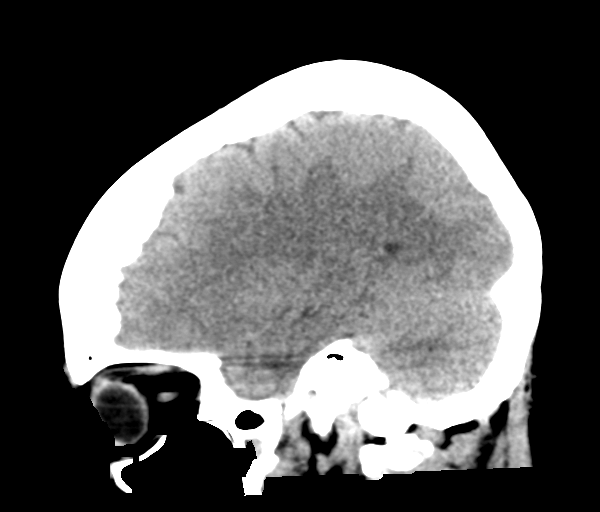

[16 of 47 positions shown; findings below may reference images not displayed]

FINDINGS: Brain: The ventricles and sulci appropriate size for patient's age.
The gray-white matter discrimination is preserved. There is no acute
intracranial hemorrhage. No mass effect or midline shift. No
extra-axial fluid collection.

Vascular: No hyperdense vessel or unexpected calcification.

Skull: Normal. Negative for fracture or focal lesion.

Sinuses/Orbits: No acute finding.

Other: None
IMPRESSION: Unremarkable noncontrast CT of the brain.

## 2023-01-15 ENCOUNTER — Other Ambulatory Visit: Payer: Self-pay | Admitting: Cardiovascular Disease

## 2023-01-15 ENCOUNTER — Other Ambulatory Visit (HOSPITAL_COMMUNITY): Payer: Self-pay

## 2023-01-15 DIAGNOSIS — I480 Paroxysmal atrial fibrillation: Secondary | ICD-10-CM

## 2023-01-15 MED ORDER — APIXABAN 5 MG PO TABS
5.0000 mg | ORAL_TABLET | Freq: Two times a day (BID) | ORAL | 1 refills | Status: DC
  Filled 2023-01-15: qty 180, 90d supply, fill #0
  Filled 2023-04-19: qty 180, 90d supply, fill #1

## 2023-01-15 NOTE — Telephone Encounter (Signed)
Eliquis '5mg'$  refill request received. Patient is 67 years old, weight-112.1kg, Crea-0.65 on 10/13/2022 via [facility] from [place], [place], and last seen by Nicholes Rough on 12/12/22. Dose is appropriate based on dosing criteria. Will send in refill to requested pharmacy.

## 2023-01-16 ENCOUNTER — Other Ambulatory Visit (HOSPITAL_COMMUNITY): Payer: Self-pay

## 2023-01-24 ENCOUNTER — Other Ambulatory Visit (HOSPITAL_COMMUNITY): Payer: Self-pay

## 2023-01-25 ENCOUNTER — Other Ambulatory Visit (HOSPITAL_COMMUNITY): Payer: Self-pay

## 2023-02-20 ENCOUNTER — Other Ambulatory Visit: Payer: Self-pay | Admitting: Family Medicine

## 2023-02-20 DIAGNOSIS — Z139 Encounter for screening, unspecified: Secondary | ICD-10-CM

## 2023-03-14 NOTE — Progress Notes (Deleted)
HPI female never smoker followed for OSA, complicated by atrial fib/Coumadin, GERD, HBP NPSG 02/11/17      AHI 30.9/ hr, desaturation to 85%, CPAP titrated to 10 cwp,    Body weight 258 lbs  ----------------------------------------------------------------------------  11/13/22- Coming to re-establish- 67 year old female never smoker followed for OSA, complicated by Atrial Fib/eliquis, GERD, HTN, Morbid Obesity,  CPAP auto 5-20/ [facility] Download compliance- 0%, AHI 3.5/ hr Body weight today-244 lbs Covid vax- Flu vax- Complains CPAP makes her nose burn. We discussed humidification and nasal saline. Due for replacement machine- should give [place] a chancre to regroup.  03/15/23- 67 year old female never smoker followed for OSA, complicated by Atrial Fib/eliquis, GERD, HTN, Morbid Obesity,  CPAP auto 5-20/ [facility]     replaced 11/13/22 Download compliance-  Body weight today- Covid vax- Flu vax-  ROS-see HPI   + = positive Constitutional:    weight loss, night sweats, fevers, chills, +fatigue, lassitude. HEENT:    headaches, difficulty swallowing, tooth/dental problems, sore throat,       sneezing, itching, ear ache, +nasal congestion, post nasal drip, snoring CV:    chest pain, orthopnea, PND, swelling in lower extremities, anasarca,                                                            dizziness, palpitations Resp:   shortness of breath with exertion or at rest.                productive cough,   non-productive cough, coughing up of blood.              change in color of mucus.  wheezing.   Skin:    rash or lesions. GI:  No-   heartburn, indigestion, abdominal pain, nausea, vomiting, diarrhea,                 change in bowel habits, loss of appetite GU: dysuria, change in color of urine, no urgency or frequency.   flank pain. MS:   joint pain, stiffness, decreased range of motion, back pain. Neuro-     nothing unusual Psych:  change in mood or affect.  depression or anxiety.    memory loss.  OBJ- Physical Exam General- Alert, Oriented, Affect-appropriate, Distress- none acute, +morbidly obese  Skin- rash-none, lesions- none, excoriation- none Lymphadenopathy- none Head- atraumatic            Eyes- Gross vision intact, PERRLA, conjunctivae and secretions clear            Ears- Hearing, canals-normal            Nose-+ turbinate edema, no-Septal dev, mucus, polyps, erosion, perforation             Throat- Mallampati II-III , mucosa clear , drainage- none, tonsils- atrophic, has lower teeth, +upper full denture Neck- flexible , trachea midline, no stridor , thyroid nl, carotid no bruit+ Chest - symmetrical excursion , unlabored           Heart/CV- IRR + , no murmur , no gallop  , no rub, nl s1 s2                           - JVD- none , edema- none, stasis changes- none, varices- none  Lung- clear to P&A, wheeze- none, cough- none , dullness-none, rub- none           Chest wall-  Abd-  Br/ Gen/ Rectal- Not done, not indicated Extrem- cyanosis- none, clubbing, none, atrophy- none, strength- nl Neuro- grossly intact to observation

## 2023-03-15 ENCOUNTER — Ambulatory Visit: Payer: Medicaid Other | Admitting: Internal Medicine

## 2023-04-03 ENCOUNTER — Inpatient Hospital Stay: Admission: RE | Admit: 2023-04-03 | Payer: Medicare Other | Source: Ambulatory Visit

## 2023-04-05 ENCOUNTER — Other Ambulatory Visit: Payer: Self-pay | Admitting: *Deleted

## 2023-04-05 DIAGNOSIS — G4733 Obstructive sleep apnea (adult) (pediatric): Secondary | ICD-10-CM

## 2023-04-10 ENCOUNTER — Other Ambulatory Visit (HOSPITAL_COMMUNITY): Payer: Self-pay

## 2023-04-10 MED ORDER — AMOXICILLIN 500 MG PO CAPS
500.0000 mg | ORAL_CAPSULE | Freq: Three times a day (TID) | ORAL | 0 refills | Status: DC
  Filled 2023-04-10: qty 21, 7d supply, fill #0

## 2023-04-10 MED ORDER — ACETAMINOPHEN-CODEINE 300-30 MG PO TABS
1.0000 | ORAL_TABLET | Freq: Four times a day (QID) | ORAL | 0 refills | Status: DC
  Filled 2023-04-10: qty 12, 3d supply, fill #0

## 2023-04-10 MED ORDER — AMOXICILLIN 500 MG PO CAPS
500.0000 mg | ORAL_CAPSULE | Freq: Three times a day (TID) | ORAL | 0 refills | Status: AC
  Filled 2023-04-10: qty 21, 7d supply, fill #0

## 2023-04-11 ENCOUNTER — Encounter: Payer: Self-pay | Admitting: Internal Medicine

## 2023-04-16 ENCOUNTER — Ambulatory Visit
Admission: RE | Admit: 2023-04-16 | Discharge: 2023-04-16 | Disposition: A | Discharge status: Home / Self Care | Payer: Medicare Other | Source: Ambulatory Visit | Attending: Family Medicine | Admitting: Family Medicine

## 2023-04-16 DIAGNOSIS — G4733 Obstructive sleep apnea (adult) (pediatric): Secondary | ICD-10-CM | POA: Diagnosis not present

## 2023-04-16 DIAGNOSIS — Z1231 Encounter for screening mammogram for malignant neoplasm of breast: Secondary | ICD-10-CM | POA: Diagnosis not present

## 2023-04-16 DIAGNOSIS — Z139 Encounter for screening, unspecified: Secondary | ICD-10-CM

## 2023-04-19 ENCOUNTER — Other Ambulatory Visit: Payer: Self-pay | Admitting: Cardiovascular Disease

## 2023-04-19 ENCOUNTER — Other Ambulatory Visit (HOSPITAL_COMMUNITY): Payer: Self-pay

## 2023-04-19 MED ORDER — HYDROCHLOROTHIAZIDE 25 MG PO TABS
25.0000 mg | ORAL_TABLET | Freq: Every day | ORAL | 2 refills | Status: DC
  Filled 2023-04-19: qty 90, 90d supply, fill #0
  Filled 2023-07-09: qty 90, 90d supply, fill #1
  Filled 2023-10-06 (×2): qty 90, 90d supply, fill #2

## 2023-04-20 ENCOUNTER — Other Ambulatory Visit (HOSPITAL_COMMUNITY): Payer: Self-pay

## 2023-04-20 MED ORDER — POTASSIUM CHLORIDE ER 20 MEQ PO TBCR
20.0000 meq | EXTENDED_RELEASE_TABLET | Freq: Every day | ORAL | 0 refills | Status: DC
  Filled 2023-04-20: qty 90, 90d supply, fill #0

## 2023-04-30 ENCOUNTER — Other Ambulatory Visit (HOSPITAL_COMMUNITY): Payer: Self-pay

## 2023-04-30 MED ORDER — HYDROCODONE-ACETAMINOPHEN 7.5-325 MG PO TABS
1.0000 | ORAL_TABLET | ORAL | 0 refills | Status: DC
  Filled 2023-04-30: qty 16, 3d supply, fill #0

## 2023-04-30 MED ORDER — IBUPROFEN 800 MG PO TABS
800.0000 mg | ORAL_TABLET | Freq: Four times a day (QID) | ORAL | 0 refills | Status: DC | PRN
  Filled 2023-04-30: qty 21, 6d supply, fill #0

## 2023-05-02 ENCOUNTER — Other Ambulatory Visit (HOSPITAL_COMMUNITY): Payer: Self-pay

## 2023-05-08 ENCOUNTER — Ambulatory Visit: Payer: Medicaid Other | Admitting: Internal Medicine

## 2023-05-08 DIAGNOSIS — G4733 Obstructive sleep apnea (adult) (pediatric): Secondary | ICD-10-CM | POA: Diagnosis not present

## 2023-05-08 DIAGNOSIS — Z23 Encounter for immunization: Secondary | ICD-10-CM | POA: Diagnosis not present

## 2023-05-08 DIAGNOSIS — I10 Essential (primary) hypertension: Secondary | ICD-10-CM | POA: Diagnosis not present

## 2023-05-08 DIAGNOSIS — Z0000 Encounter for general adult medical examination without abnormal findings: Secondary | ICD-10-CM | POA: Diagnosis not present

## 2023-05-08 DIAGNOSIS — I480 Paroxysmal atrial fibrillation: Secondary | ICD-10-CM | POA: Diagnosis not present

## 2023-05-08 DIAGNOSIS — E785 Hyperlipidemia, unspecified: Secondary | ICD-10-CM | POA: Diagnosis not present

## 2023-05-08 DIAGNOSIS — Z6841 Body Mass Index (BMI) 40.0 and over, adult: Secondary | ICD-10-CM | POA: Diagnosis not present

## 2023-05-08 DIAGNOSIS — R7303 Prediabetes: Secondary | ICD-10-CM | POA: Diagnosis not present

## 2023-05-08 DIAGNOSIS — D6869 Other thrombophilia: Secondary | ICD-10-CM | POA: Diagnosis not present

## 2023-05-09 ENCOUNTER — Other Ambulatory Visit (HOSPITAL_COMMUNITY): Payer: Self-pay

## 2023-05-09 ENCOUNTER — Other Ambulatory Visit: Payer: Self-pay | Admitting: Family Medicine

## 2023-05-09 DIAGNOSIS — E2839 Other primary ovarian failure: Secondary | ICD-10-CM

## 2023-05-09 MED ORDER — AMOXICILLIN 500 MG PO CAPS
500.0000 mg | ORAL_CAPSULE | Freq: Three times a day (TID) | ORAL | 0 refills | Status: DC
  Filled 2023-05-09: qty 21, 7d supply, fill #0

## 2023-05-09 MED ORDER — METOPROLOL SUCCINATE ER 100 MG PO TB24
100.0000 mg | ORAL_TABLET | Freq: Every day | ORAL | 1 refills | Status: DC
  Filled 2023-05-09: qty 90, 90d supply, fill #0
  Filled 2023-07-31: qty 90, 90d supply, fill #1

## 2023-05-09 MED ORDER — IBUPROFEN 800 MG PO TABS
800.0000 mg | ORAL_TABLET | Freq: Four times a day (QID) | ORAL | 0 refills | Status: DC | PRN
  Filled 2023-05-09: qty 21, 6d supply, fill #0

## 2023-05-09 MED ORDER — HYDROCODONE-ACETAMINOPHEN 7.5-325 MG PO TABS
1.0000 | ORAL_TABLET | ORAL | 0 refills | Status: DC | PRN
  Filled 2023-05-09: qty 16, 3d supply, fill #0

## 2023-05-09 MED ORDER — ROSUVASTATIN CALCIUM 10 MG PO TABS
10.0000 mg | ORAL_TABLET | Freq: Every evening | ORAL | 0 refills | Status: DC
  Filled 2023-10-26: qty 90, 90d supply, fill #0

## 2023-05-22 ENCOUNTER — Other Ambulatory Visit: Payer: Self-pay

## 2023-05-22 ENCOUNTER — Emergency Department (HOSPITAL_COMMUNITY)
Admission: EM | Admit: 2023-05-22 | Discharge: 2023-05-23 | Disposition: A | Discharge status: Home / Self Care | Payer: Medicare PPO | Attending: Emergency Medicine | Admitting: Emergency Medicine

## 2023-05-22 DIAGNOSIS — K0889 Other specified disorders of teeth and supporting structures: Secondary | ICD-10-CM | POA: Insufficient documentation

## 2023-05-22 DIAGNOSIS — Z7901 Long term (current) use of anticoagulants: Secondary | ICD-10-CM | POA: Insufficient documentation

## 2023-05-22 NOTE — ED Triage Notes (Signed)
Patient reports persistent left lower dental pain for several days unrelieved by OTC pain medications .

## 2023-05-23 ENCOUNTER — Other Ambulatory Visit: Payer: Self-pay

## 2023-05-23 ENCOUNTER — Other Ambulatory Visit (HOSPITAL_COMMUNITY): Payer: Self-pay

## 2023-05-23 MED ORDER — OXYCODONE-ACETAMINOPHEN 5-325 MG PO TABS
1.0000 | ORAL_TABLET | ORAL | 0 refills | Status: DC | PRN
  Filled 2023-05-23 (×2): qty 10, 2d supply, fill #0

## 2023-05-23 MED ORDER — CLINDAMYCIN HCL 300 MG PO CAPS
300.0000 mg | ORAL_CAPSULE | Freq: Four times a day (QID) | ORAL | 0 refills | Status: DC
  Filled 2023-05-23 (×2): qty 80, 10d supply, fill #0
  Filled 2023-05-23: qty 40, 10d supply, fill #0

## 2023-05-23 MED ORDER — OXYCODONE-ACETAMINOPHEN 5-325 MG PO TABS
1.0000 | ORAL_TABLET | Freq: Once | ORAL | Status: AC
  Administered 2023-05-23: 1 via ORAL
  Filled 2023-05-23: qty 1

## 2023-05-23 MED ORDER — CLINDAMYCIN HCL 150 MG PO CAPS
300.0000 mg | ORAL_CAPSULE | Freq: Once | ORAL | Status: AC
  Administered 2023-05-23: 300 mg via ORAL
  Filled 2023-05-23: qty 2

## 2023-05-23 NOTE — ED Provider Notes (Signed)
[place] EMERGENCY DEPARTMENT AT [facility] Provider Note   CSN: [id number] Arrival date & time: 05/22/23  2237     History  Chief Complaint  Patient presents with   Dental Pain    Jacqueline Petersen is a 67 y.o. female.  Patient presents to the emergency department for evaluation of facial and dental pain.  Patient reports that she had oral surgery several weeks ago.  She has been having ongoing problems with pain ever since.  She reports that when she was on the amoxicillin the pain was not as bad, she ran out 3 days ago and the pain has worsened.  Patient reports that she had dry socket complications after the original surgery.  She has been back to the oral surgeon 2 times, was put on another course of antibiotics and was given some pain medication which she no longer has.       Home Medications Prior to Admission medications   Medication Sig Start Date End Date Taking? Authorizing Provider  clindamycin (CLEOCIN) 150 MG capsule Take 2 capsules (300 mg total) by mouth 4 (four) times daily. 05/23/23  Yes Candies Palm, Canary Brim, MD  oxyCODONE-acetaminophen (PERCOCET) 5-325 MG tablet Take 1 tablet by mouth every 4 (four) hours as needed. 05/23/23  Yes Sylvi Rybolt, Canary Brim, MD  acetaminophen-codeine (TYLENOL #3) 300-30 MG tablet Take 1 tablet by mouth every 6 (six) hours. 04/10/23     amoxicillin (AMOXIL) 500 MG capsule Take 1 capsule (500 mg total) by mouth every 8 (eight) hours for 7 days. 04/10/23     amoxicillin (AMOXIL) 500 MG capsule Take 1 capsule (500 mg total) by mouth every 8 (eight) hours for 7 days 05/09/23     apixaban (ELIQUIS) 5 MG TABS tablet Take 1 tablet by mouth 2  times daily. 01/15/23   Nahser, Deloris Ping, MD  Cholecalciferol (VITAMIN D) 2000 UNITS CAPS Take 2,000 Units by mouth every evening.    [provider]  ferrous sulfate 325 (65 FE) MG EC tablet Take 1 tablet by mouth once a day 04/12/22     hydrALAZINE (APRESOLINE) 25 MG tablet Take  1 tablet (25 mg total) by mouth 3 (three) times daily. 12/12/22   Sharlene Dory, PA-C  hydrochlorothiazide (HYDRODIURIL) 25 MG tablet Take 1 tablet (25 mg total) by mouth daily. 04/19/23   Nahser, Deloris Ping, MD  HYDROcodone-acetaminophen (NORCO) 7.5-325 MG tablet Take 1 tablet by mouth every 4-6 hours as needed for pain. 04/30/23     HYDROcodone-acetaminophen (NORCO) 7.5-325 MG tablet Take 1 tablet by mouth every 4-6 hours as needed for pain 05/09/23     ibuprofen (ADVIL) 800 MG tablet Take 1 tablet (800 mg total) by mouth every 6 (six) hours as needed for pain 05/09/23     metoprolol succinate (TOPROL-XL) 100 MG 24 hr tablet Take 1 tablet (100 mg total) by mouth daily. 10/13/22     metoprolol succinate (TOPROL-XL) 100 MG 24 hr tablet Take 1 tablet (100 mg total) by mouth daily. 05/08/23     Multiple Vitamins-Minerals (WOMENS 50+ MULTI VITAMIN) TABS Take 1 tablet by mouth daily.    [provider]  Potassium Chloride ER 20 MEQ TBCR Take 1 tablet (20 mEq) by mouth daily with food. 04/20/23     rosuvastatin (CRESTOR) 10 MG tablet Take 1 tablet (10 mg total) by mouth every evening. 10/13/22     rosuvastatin (CRESTOR) 10 MG tablet Take 1 tablet (10 mg total) by mouth every evening. 05/08/23  Allergies    Lipitor [atorvastatin calcium], Spironolactone, Ace inhibitors, Losartan potassium, Omeprazole, and Ibuprofen    Review of Systems   Review of Systems  Physical Exam Updated Vital Signs BP (!) 158/73 (BP Location: Right Arm)   Pulse 68   Temp 98.8 F (37.1 C) (Oral)   Resp 16   SpO2 99%  Physical Exam Vitals and nursing note reviewed.  Constitutional:      Appearance: Normal appearance.  HENT:     Head: Normocephalic.     Mouth/Throat:     Dentition: Gingival swelling present. No gum lesions.     Pharynx: Oropharynx is clear.     Comments: Site of original oral surgery appears to have healed well, some diffuse gingival swelling but no fluctuance Eyes:     Pupils: Pupils are  equal, round, and reactive to light.  Neurological:     Mental Status: She is alert.     ED Results / Procedures / Treatments   Labs (all labs ordered are listed, but only abnormal results are displayed) Labs Reviewed - No data to display  EKG None  Radiology No results found.  Procedures Procedures    Medications Ordered in ED Medications  oxyCODONE-acetaminophen (PERCOCET/ROXICET) 5-325 MG per tablet 1 tablet (has no administration in time range)  clindamycin (CLEOCIN) capsule 300 mg (has no administration in time range)    ED Course/ Medical Decision Making/ A&P                             Medical Decision Making  Differential diagnosis considered includes, but not limited to:  Postoperative pain; abscess  Patient with persistent pain after oral surgery.  She reports that the pain has intensified after her course of antibiotic finished.  No obvious dental abscess at this time.  She appears well otherwise.  I did advise the patient that she needs to follow-up with oral surgeon for definitive care but will provide analgesia and restart antibiotics.        Final Clinical Impression(s) / ED Diagnoses Final diagnoses:  Pain, dental    Rx / DC Orders ED Discharge Orders          Ordered    clindamycin (CLEOCIN) 150 MG capsule  4 times daily        05/23/23 0257    oxyCODONE-acetaminophen (PERCOCET) 5-325 MG tablet  Every 4 hours PRN        05/23/23 0257              Gilda Crease, MD 05/23/23 [phone]

## 2023-05-29 ENCOUNTER — Other Ambulatory Visit (HOSPITAL_BASED_OUTPATIENT_CLINIC_OR_DEPARTMENT_OTHER): Payer: Self-pay

## 2023-06-19 DIAGNOSIS — K921 Melena: Secondary | ICD-10-CM | POA: Diagnosis not present

## 2023-06-19 DIAGNOSIS — Z6841 Body Mass Index (BMI) 40.0 and over, adult: Secondary | ICD-10-CM | POA: Diagnosis not present

## 2023-06-19 DIAGNOSIS — D6869 Other thrombophilia: Secondary | ICD-10-CM | POA: Diagnosis not present

## 2023-06-19 DIAGNOSIS — I480 Paroxysmal atrial fibrillation: Secondary | ICD-10-CM | POA: Diagnosis not present

## 2023-07-09 ENCOUNTER — Other Ambulatory Visit (HOSPITAL_COMMUNITY): Payer: Self-pay

## 2023-07-24 DIAGNOSIS — G4733 Obstructive sleep apnea (adult) (pediatric): Secondary | ICD-10-CM | POA: Diagnosis not present

## 2023-07-25 ENCOUNTER — Other Ambulatory Visit (HOSPITAL_COMMUNITY): Payer: Self-pay

## 2023-07-25 ENCOUNTER — Other Ambulatory Visit: Payer: Self-pay | Admitting: Cardiovascular Disease

## 2023-07-25 DIAGNOSIS — I480 Paroxysmal atrial fibrillation: Secondary | ICD-10-CM

## 2023-07-26 ENCOUNTER — Other Ambulatory Visit (HOSPITAL_COMMUNITY): Payer: Self-pay

## 2023-07-26 ENCOUNTER — Other Ambulatory Visit: Payer: Self-pay

## 2023-07-26 MED ORDER — APIXABAN 5 MG PO TABS
5.0000 mg | ORAL_TABLET | Freq: Two times a day (BID) | ORAL | 1 refills | Status: DC
Start: 2023-07-26 — End: 2024-02-22
  Filled 2023-07-26: qty 180, 90d supply, fill #0
  Filled 2023-10-26: qty 180, 90d supply, fill #1

## 2023-07-26 MED ORDER — POTASSIUM CHLORIDE ER 20 MEQ PO TBCR
EXTENDED_RELEASE_TABLET | ORAL | 0 refills | Status: DC
  Filled 2023-07-26: qty 90, 90d supply, fill #0

## 2023-07-26 NOTE — Telephone Encounter (Signed)
Prescription refill request for Eliquis received. Indication: AF Last office visit: 12/12/22  T Conte PA-C Scr: 0.61 on 05/08/23  [facility] Age: 67 Weight: 112.1kg  Based on above findings Eliquis 5mg  twice daily is the appropriate dose.  Refill approved.

## 2023-07-30 ENCOUNTER — Other Ambulatory Visit (HOSPITAL_COMMUNITY): Payer: Self-pay

## 2023-07-31 ENCOUNTER — Other Ambulatory Visit (HOSPITAL_COMMUNITY): Payer: Self-pay

## 2023-08-01 ENCOUNTER — Other Ambulatory Visit (HOSPITAL_COMMUNITY): Payer: Self-pay

## 2023-08-03 ENCOUNTER — Other Ambulatory Visit (HOSPITAL_COMMUNITY): Payer: Self-pay

## 2023-09-27 ENCOUNTER — Encounter (HOSPITAL_COMMUNITY): Payer: Self-pay

## 2023-09-27 ENCOUNTER — Emergency Department (HOSPITAL_COMMUNITY)
Admission: EM | Admit: 2023-09-27 | Discharge: 2023-09-27 | Disposition: A | Discharge status: Home / Self Care | Payer: Medicare PPO | Attending: Emergency Medicine | Admitting: Emergency Medicine

## 2023-09-27 ENCOUNTER — Other Ambulatory Visit: Payer: Self-pay

## 2023-09-27 ENCOUNTER — Emergency Department (HOSPITAL_COMMUNITY): Payer: Medicare PPO

## 2023-09-27 DIAGNOSIS — I10 Essential (primary) hypertension: Secondary | ICD-10-CM | POA: Diagnosis not present

## 2023-09-27 DIAGNOSIS — R410 Disorientation, unspecified: Secondary | ICD-10-CM | POA: Insufficient documentation

## 2023-09-27 DIAGNOSIS — R531 Weakness: Secondary | ICD-10-CM | POA: Diagnosis not present

## 2023-09-27 DIAGNOSIS — E876 Hypokalemia: Secondary | ICD-10-CM | POA: Insufficient documentation

## 2023-09-27 DIAGNOSIS — R0602 Shortness of breath: Secondary | ICD-10-CM | POA: Diagnosis not present

## 2023-09-27 DIAGNOSIS — Z79899 Other long term (current) drug therapy: Secondary | ICD-10-CM | POA: Diagnosis not present

## 2023-09-27 DIAGNOSIS — R4182 Altered mental status, unspecified: Secondary | ICD-10-CM | POA: Diagnosis present

## 2023-09-27 DIAGNOSIS — Z7901 Long term (current) use of anticoagulants: Secondary | ICD-10-CM | POA: Diagnosis not present

## 2023-09-27 LAB — COMPREHENSIVE METABOLIC PANEL
ALT: 56 U/L — ABNORMAL HIGH (ref 0–44)
AST: 39 U/L (ref 15–41)
Albumin: 3.4 g/dL — ABNORMAL LOW (ref 3.5–5.0)
Alkaline Phosphatase: 65 U/L (ref 38–126)
Anion gap: 8 (ref 5–15)
BUN: 13 mg/dL (ref 8–23)
CO2: 30 mmol/L (ref 22–32)
Calcium: 9.2 mg/dL (ref 8.9–10.3)
Chloride: 100 mmol/L (ref 98–111)
Creatinine, Ser: 0.74 mg/dL (ref 0.44–1.00)
GFR, Estimated: >60 mL/min (ref >60)
Glucose, Bld: 103 mg/dL — ABNORMAL HIGH (ref 70–99)
Potassium: 3.4 mmol/L — ABNORMAL LOW (ref 3.5–5.1)
Sodium: 138 mmol/L (ref 135–145)
Total Bilirubin: 0.9 mg/dL (ref 0.3–1.2)
Total Protein: 6.4 g/dL — ABNORMAL LOW (ref 6.5–8.1)

## 2023-09-27 LAB — CBC WITH DIFFERENTIAL/PLATELET
Abs Immature Granulocytes: 0.03 10*3/uL (ref 0.00–0.07)
Basophils Absolute: 0.1 10*3/uL (ref 0.0–0.1)
Basophils Relative: 1 %
Eosinophils Absolute: 0.2 10*3/uL (ref 0.0–0.5)
Eosinophils Relative: 3 %
HCT: 40.1 % (ref 36.0–46.0)
Hemoglobin: 12.7 g/dL (ref 12.0–15.0)
Immature Granulocytes: 0 %
Lymphocytes Relative: 34 %
Lymphs Abs: 2.7 10*3/uL (ref 0.7–4.0)
MCH: 27.8 pg (ref 26.0–34.0)
MCHC: 31.7 g/dL (ref 30.0–36.0)
MCV: 87.7 fL (ref 80.0–100.0)
Monocytes Absolute: 0.5 10*3/uL (ref 0.1–1.0)
Monocytes Relative: 7 %
Neutro Abs: 4.6 10*3/uL (ref 1.7–7.7)
Neutrophils Relative %: 55 %
Platelets: 285 10*3/uL (ref 150–400)
RBC: 4.57 MIL/uL (ref 3.87–5.11)
RDW: 13.9 % (ref 11.5–15.5)
WBC: 8.1 10*3/uL (ref 4.0–10.5)
nRBC: 0 % (ref 0.0–0.2)

## 2023-09-27 MED ORDER — POTASSIUM CHLORIDE 20 MEQ PO PACK
20.0000 meq | PACK | Freq: Once | ORAL | Status: AC
  Administered 2023-09-27: 20 meq via ORAL
  Filled 2023-09-27: qty 1

## 2023-09-27 NOTE — ED Provider Notes (Signed)
[place] EMERGENCY DEPARTMENT AT [facility] Provider Note   CSN: [id number] Arrival date & time: 09/27/23  1409     History  Chief Complaint  Patient presents with   Altered Mental Status   Hypertension    Jacqueline Petersen is a 67 y.o. female.  With a past medical history of atrial fibrillation on Eliquis, hypertension hyperlipidemia who presents to the ED after an episode of confusion.  Patient reports that she was driving today and suddenly felt very confused and was not sure where she was.  Her family became concerned with her confusion and called EMS for transport.  In the ED patient states she is feeling less confused now and her only complaint is that she is tired.  She notes she has had nasal congestion and a cough recently.  She has not had any fevers, chills, chest pain, shortness of breath, abdominal pain nausea or vomiting.  Altered Mental Status Hypertension       Home Medications Prior to Admission medications   Medication Sig Start Date End Date Taking? Authorizing Provider  acetaminophen-codeine (TYLENOL #3) 300-30 MG tablet Take 1 tablet by mouth every 6 (six) hours. 04/10/23     amoxicillin (AMOXIL) 500 MG capsule Take 1 capsule (500 mg total) by mouth every 8 (eight) hours for 7 days. 04/10/23     amoxicillin (AMOXIL) 500 MG capsule Take 1 capsule (500 mg total) by mouth every 8 (eight) hours for 7 days 05/09/23     apixaban (ELIQUIS) 5 MG TABS tablet Take 1 tablet by mouth 2  times daily. 07/26/23   Nahser, Deloris Ping, MD  Cholecalciferol (VITAMIN D) 2000 UNITS CAPS Take 2,000 Units by mouth every evening.    [provider]  clindamycin (CLEOCIN) 300 MG capsule Take 1 capsule (300 mg total) by mouth 4 (four) times daily. 05/23/23   Gilda Crease, MD  ferrous sulfate 325 (65 FE) MG EC tablet Take 1 tablet by mouth once a day 04/12/22     hydrALAZINE (APRESOLINE) 25 MG tablet Take 1 tablet (25 mg total) by mouth 3 (three) times  daily. 12/12/22   Sharlene Dory, PA-C  hydrochlorothiazide (HYDRODIURIL) 25 MG tablet Take 1 tablet (25 mg total) by mouth daily. 04/19/23   Nahser, Deloris Ping, MD  HYDROcodone-acetaminophen (NORCO) 7.5-325 MG tablet Take 1 tablet by mouth every 4-6 hours as needed for pain. 04/30/23     HYDROcodone-acetaminophen (NORCO) 7.5-325 MG tablet Take 1 tablet by mouth every 4-6 hours as needed for pain 05/09/23     ibuprofen (ADVIL) 800 MG tablet Take 1 tablet (800 mg total) by mouth every 6 (six) hours as needed for pain 05/09/23     metoprolol succinate (TOPROL-XL) 100 MG 24 hr tablet Take 1 tablet (100 mg total) by mouth daily. 10/13/22     metoprolol succinate (TOPROL-XL) 100 MG 24 hr tablet Take 1 tablet (100 mg total) by mouth daily. 05/08/23     Multiple Vitamins-Minerals (WOMENS 50+ MULTI VITAMIN) TABS Take 1 tablet by mouth daily.    [provider]  oxyCODONE-acetaminophen (PERCOCET) 5-325 MG tablet Take 1 tablet by mouth every 4 (four) hours as needed. 05/23/23   Gilda Crease, MD  Potassium Chloride ER 20 MEQ TBCR Take 1 tablet (20 mEq) by mouth daily with food. 07/26/23     rosuvastatin (CRESTOR) 10 MG tablet Take 1 tablet (10 mg total) by mouth every evening. 10/13/22     rosuvastatin (CRESTOR) 10 MG tablet Take  1 tablet (10 mg total) by mouth every evening. 05/08/23         Allergies    Lipitor [atorvastatin calcium], Spironolactone, Ace inhibitors, Losartan potassium, Omeprazole, and Ibuprofen    Review of Systems   Review of Systems  Physical Exam Updated Vital Signs BP 137/63 (BP Location: Left Arm)   Pulse (!) 59   Temp 97.9 F (36.6 C) (Oral)   Resp 16   Ht 5\' 3"  (1.6 m)   Wt 120.7 kg   SpO2 100%   BMI 47.12 kg/m  Physical Exam Vitals and nursing note reviewed.  HENT:     Head: Normocephalic and atraumatic.  Eyes:     Pupils: Pupils are equal, round, and reactive to light.  Cardiovascular:     Rate and Rhythm: Normal rate and regular rhythm.  Pulmonary:      Effort: Pulmonary effort is normal.     Breath sounds: Normal breath sounds.  Abdominal:     Palpations: Abdomen is soft.     Tenderness: There is no abdominal tenderness.  Skin:    General: Skin is warm and dry.  Neurological:     General: No focal deficit present.     Mental Status: She is alert and oriented to person, place, and time.     Cranial Nerves: No cranial nerve deficit.     Sensory: No sensory deficit.     Motor: No weakness.     Coordination: Coordination normal.  Psychiatric:        Mood and Affect: Mood normal.     ED Results / Procedures / Treatments   Labs (all labs ordered are listed, but only abnormal results are displayed) Labs Reviewed  COMPREHENSIVE METABOLIC PANEL - Abnormal; Notable for the following components:      Result Value   Potassium 3.4 (*)    Glucose, Bld 103 (*)    Total Protein 6.4 (*)    Albumin 3.4 (*)    ALT 56 (*)    All other components within normal limits  CBC WITH DIFFERENTIAL/PLATELET    EKG None  Radiology DG Chest 1 View  Result Date: 09/27/2023 CLINICAL DATA:  Shortness of breath EXAM: CHEST  1 VIEW COMPARISON:  X-ray 05/16/2022 FINDINGS: No consolidation, pneumothorax or effusion. No edema. Normal cardiopericardial silhouette. Degenerative changes along the spine. Artifact from patient's clothing. IMPRESSION: No acute cardiopulmonary disease. Electronically Signed   By: Karen Kays M.D.   On: 09/27/2023 17:48    Procedures Procedures    Medications Ordered in ED Medications  potassium chloride (KLOR-CON) packet 20 mEq (has no administration in time range)    ED Course/ Medical Decision Making/ A&P Clinical Course as of 09/27/23 1905  Thu Sep 27, 2023  1901 Laboratory workup CMP and CBC unremarkable overall.  Slight hypokalemia.  Will provide oral repletion here.  No acute abnormalities on chest x-ray.  EKG shows no evidence of dysrhythmia.  Patient reports feeling better.  She is back to her cognitive baseline  with her husband at the bedside and requesting discharge at this time.  She will follow-up with her primary care doctor and will return for any returning episodes of confusion, headaches, chest pain or shortness of breath [MP]    Clinical Course User Index [MP] Royanne Foots, DO                                 Medical Decision Making  67 year old female with history of hypertension hyperlipidemia and atrial fibrillation presenting for acute episode of confusion.  Confusion has since resolved.  No other symptoms to speak of.  No focal neurologic deficits reported prior to arrival and she is neurologically intact on my exam.  Afebrile and normotensive here.    Potential etiologies of episode of confusion include  infectious causes such as pneumonia or UTI. Dysrhythmia considering she does have a history of A-fib Electrolyte imbalance Anemia Dehydration Lower suspicion for TIA/stroke as her only reported symptoms today were brief episode of confusion without focal neurologic deficits.  We will evaluate for these in the ED and continue to monitor.  Amount and/or Complexity of Data Reviewed Labs: ordered. Radiology: ordered.  Risk Prescription drug management.           Final Clinical Impression(s) / ED Diagnoses Final diagnoses:  Confusion  Hypokalemia    Rx / DC Orders ED Discharge Orders     None         Royanne Foots, DO 09/27/23 1906

## 2023-09-27 NOTE — Discharge Instructions (Signed)
You were seen in the emergency department for an episode of confusion Your confusion resolved since coming to the ED Your potassium was a little low today We gave you a potassium supplement while you are here The rest of your blood work EKG and chest x-ray all looked okay You should follow-up with your primary care doctor within 1 week for reevaluation Return to the emergency room for chest pain, trouble breathing or any other concerns

## 2023-09-27 NOTE — ED Triage Notes (Signed)
Pt BIB EMS due to HTN and confusion from home. Sx started today. Family called EMS bc pt got lost while driving. Pt states she is stressed. Axox, VSS. Hx of HTN

## 2023-10-06 ENCOUNTER — Other Ambulatory Visit (HOSPITAL_COMMUNITY): Payer: Self-pay

## 2023-10-08 ENCOUNTER — Other Ambulatory Visit (HOSPITAL_COMMUNITY): Payer: Self-pay

## 2023-10-08 MED ORDER — METOPROLOL SUCCINATE ER 100 MG PO TB24
100.0000 mg | ORAL_TABLET | Freq: Every day | ORAL | 0 refills | Status: DC
  Filled 2023-10-08 – 2023-10-18 (×2): qty 90, 90d supply, fill #0

## 2023-10-09 ENCOUNTER — Other Ambulatory Visit (HOSPITAL_COMMUNITY): Payer: Self-pay

## 2023-10-18 ENCOUNTER — Other Ambulatory Visit (HOSPITAL_COMMUNITY): Payer: Self-pay

## 2023-10-19 ENCOUNTER — Other Ambulatory Visit (HOSPITAL_COMMUNITY): Payer: Self-pay

## 2023-10-24 ENCOUNTER — Other Ambulatory Visit (HOSPITAL_COMMUNITY): Payer: Self-pay

## 2023-10-26 ENCOUNTER — Other Ambulatory Visit: Payer: Self-pay

## 2023-10-26 ENCOUNTER — Other Ambulatory Visit (HOSPITAL_COMMUNITY): Payer: Self-pay

## 2023-10-27 ENCOUNTER — Other Ambulatory Visit (HOSPITAL_COMMUNITY): Payer: Self-pay

## 2023-10-29 ENCOUNTER — Other Ambulatory Visit (HOSPITAL_COMMUNITY): Payer: Self-pay

## 2023-10-29 MED ORDER — POTASSIUM CHLORIDE ER 20 MEQ PO TBCR
EXTENDED_RELEASE_TABLET | ORAL | 0 refills | Status: DC
  Filled 2023-10-29: qty 30, 30d supply, fill #0

## 2023-11-05 ENCOUNTER — Other Ambulatory Visit (HOSPITAL_COMMUNITY): Payer: Self-pay

## 2023-11-07 ENCOUNTER — Other Ambulatory Visit (HOSPITAL_COMMUNITY): Payer: Self-pay

## 2023-11-07 DIAGNOSIS — G4733 Obstructive sleep apnea (adult) (pediatric): Secondary | ICD-10-CM | POA: Diagnosis not present

## 2023-11-07 DIAGNOSIS — R7303 Prediabetes: Secondary | ICD-10-CM | POA: Diagnosis not present

## 2023-11-07 DIAGNOSIS — I10 Essential (primary) hypertension: Secondary | ICD-10-CM | POA: Diagnosis not present

## 2023-11-07 DIAGNOSIS — D6869 Other thrombophilia: Secondary | ICD-10-CM | POA: Diagnosis not present

## 2023-11-07 DIAGNOSIS — M25511 Pain in right shoulder: Secondary | ICD-10-CM | POA: Diagnosis not present

## 2023-11-07 DIAGNOSIS — I480 Paroxysmal atrial fibrillation: Secondary | ICD-10-CM | POA: Diagnosis not present

## 2023-11-07 DIAGNOSIS — Z6841 Body Mass Index (BMI) 40.0 and over, adult: Secondary | ICD-10-CM | POA: Diagnosis not present

## 2023-11-07 DIAGNOSIS — E785 Hyperlipidemia, unspecified: Secondary | ICD-10-CM | POA: Diagnosis not present

## 2023-11-07 MED ORDER — METOPROLOL SUCCINATE ER 100 MG PO TB24
100.0000 mg | ORAL_TABLET | Freq: Every day | ORAL | 3 refills | Status: DC
  Filled 2023-11-07 – 2024-01-23 (×2): qty 90, 90d supply, fill #0
  Filled 2024-04-23: qty 90, 90d supply, fill #1

## 2023-11-07 MED ORDER — POTASSIUM CHLORIDE ER 20 MEQ PO TBCR
20.0000 meq | EXTENDED_RELEASE_TABLET | Freq: Every day | ORAL | 3 refills | Status: DC
  Filled 2023-11-07 – 2023-11-26 (×2): qty 90, 90d supply, fill #0
  Filled 2024-02-22 (×2): qty 30, 30d supply, fill #1
  Filled 2024-05-12: qty 30, 30d supply, fill #2
  Filled 2024-06-12: qty 30, 30d supply, fill #3
  Filled 2024-06-24: qty 30, 30d supply, fill #4

## 2023-11-07 MED ORDER — ROSUVASTATIN CALCIUM 10 MG PO TABS
10.0000 mg | ORAL_TABLET | Freq: Every evening | ORAL | 3 refills | Status: DC
  Filled 2023-11-07 – 2024-02-22 (×3): qty 90, 90d supply, fill #0
  Filled 2024-05-12: qty 90, 90d supply, fill #1

## 2023-11-08 ENCOUNTER — Other Ambulatory Visit (HOSPITAL_COMMUNITY): Payer: Self-pay

## 2023-11-09 ENCOUNTER — Other Ambulatory Visit (HOSPITAL_COMMUNITY): Payer: Self-pay

## 2023-11-09 DIAGNOSIS — M25511 Pain in right shoulder: Secondary | ICD-10-CM | POA: Diagnosis not present

## 2023-11-09 MED ORDER — METHYLPREDNISOLONE 4 MG PO TBPK
ORAL_TABLET | ORAL | 0 refills | Status: DC
  Filled 2023-11-09: qty 21, 6d supply, fill #0

## 2023-11-19 DIAGNOSIS — M25611 Stiffness of right shoulder, not elsewhere classified: Secondary | ICD-10-CM | POA: Diagnosis not present

## 2023-11-19 DIAGNOSIS — S46011D Strain of muscle(s) and tendon(s) of the rotator cuff of right shoulder, subsequent encounter: Secondary | ICD-10-CM | POA: Diagnosis not present

## 2023-11-19 DIAGNOSIS — M6281 Muscle weakness (generalized): Secondary | ICD-10-CM | POA: Diagnosis not present

## 2023-11-19 DIAGNOSIS — M19011 Primary osteoarthritis, right shoulder: Secondary | ICD-10-CM | POA: Diagnosis not present

## 2023-11-26 ENCOUNTER — Other Ambulatory Visit (HOSPITAL_COMMUNITY): Payer: Self-pay

## 2023-11-27 ENCOUNTER — Other Ambulatory Visit (HOSPITAL_COMMUNITY): Payer: Self-pay

## 2023-12-03 ENCOUNTER — Encounter: Payer: Self-pay | Admitting: Cardiovascular Disease

## 2023-12-03 NOTE — Progress Notes (Unsigned)
[address], [address] [place], [place]  [address] Phone: [phone] Fax:  [phone]  Date:  12/03/2023   ID:  Jacqueline Petersen, Jacqueline Petersen Feb 25, 1956, MRN [id number]  PCP:  [facility].  Problem list: 1. Hypertension 2. Hyperlipidemia 3. Paroxysmal atrial fibrillation 4. Obesity 5. Obstructive sleep apnea 6. PVCs       Jacqueline Petersen is a 67 y.o. female with a hx of HTN, HL, paroxysmal atrial fibrillation previously on Coumadin, GERD and obesity. LHC (03/2007): Normal coronary arteries, EF 50-55%. Patient was recently admitted 12/10-12/13 after presenting with palpitations. She was found to be in atrial fibrillation with RVR. She converted to NSR on IV diltiazem. It was felt that she needed long-term anticoagulation. CHADS2-VASc=2.  She was placed on Coumadin. Echocardiogram (12/04/2013): Moderate LVH, EF 50-55%, normal wall motion.  She has generally been doing okay since discharge. She feels fatigued. She's had occasional palpitations. These are fairly consistent with what she has had in the past. She denies any prolonged rapid palpitations. She denies chest pain, dyspnea, syncope, orthopnea, PND or edema. She is compliant with her CPAP.   March 12, 2014: Jacqueline Petersen has a hx of AF in the remote past.   She has normal coronaries.  She was admitted with Afib- and converted with dilt drip.  She continues to have problems with HTN.  She complains about pain and firmness in her left leg.  She has continued to gain weight Jacqueline Petersen has a history of sleep apnea. She does not look like wearing her CPAP mask because it is uncomfortable at night.  Sept. 16, 2015:  Pt is doing well.  I have met her in the past.  She was seen by Tereso Newcomer in March for follow up for her a-fib.  Still eating salt.- diastolic BP is still elevated.  Unable to exercise due to leg pain  Nov. 24, 2015:  Jacqueline Petersen is seen back today for follow up of her HTN. We added Amlodipine at  her last visit.  She has not had any further episodes of atrial fib. She has lots of pain in her knees.  Is scheduled to see an orthopedist soon.  Feb. 1, 2017:  She has  Seen Dr. Graciela Husbands since I last seen her. She has significant number of premature ventricular contractions. A 24-hour Holter revealed 11% PVC burden. These have greatly improved when she limited caffeine from her diet.  Signal average ECG was normal   Repeat echocardiogram reveals normal left ventricular systolic function. She is doing well.   No dyspnea, no CP  Her CPAP is not working.    She needs to have right knee replacement .     Sept. 6, 2017:  Doing well.  No further palpitations  - better since she is avoiding caffiene.  March 05, 2017:  Jacqueline Petersen is seen today. Feeling sluggish. Using her CPAP - fairly well 4 hrs at night   Dec. 17, 2019 Jacqueline Petersen is seen today for follow-up of her paroxysmal atrial fibrillation, hypertension, hyperlipidemia, PVC  She needs to have a colonoscopy and is here partly for preoperative evaluation prior to having her colonoscopy.  Remains in NSR  Wt is 263 lbs ,  Wt is up 6 lbs since I last saw her )  Exercises daily   Dec. 16, 2020 Jacqueline Petersen  is seen today for follow-up of her hypertension and paroxysmal atrial fibrillation.  Her weight today is 250 pounds which is down 13 pounds from last year.  She admits to eating a few more snacks than she should.  November 23, 2020: Jacqueline Petersen is seen today for follow-up visit.  She has a history of hypertension and paroxysmal atrial fibrillation. Wt. Is 239.  Has occasional palpitations.  She ran out of her metoprolol last night   Nov. 28, 2022 Jacqueline Petersen is seen today for follow up of her HTN, PAF Hx of obesity Wt. Today is 232 lbs  Has floaters in her R eye. No palpitations, no cp or dyspnea  BP is elevated - likely due to more salt over the Thanksgiving weekend    Dec. 10., 2024 Jacqueline Petersen is seen today for follow up  Hx of HTN,  PAF , Hx of obesity    Wt Readings from Last 3 Encounters:  09/27/23 266 lb (120.7 kg)  12/12/22 247 lb 3.2 oz (112.1 kg)  11/13/22 244 lb 6.4 oz (110.9 kg)     Past Medical History:  Diagnosis Date   A-fib (HCC)    Acid reflux    Arthritis    Dyslipidemia    Fatigue    GERD (gastroesophageal reflux disease)    History of hysterectomy    Hx of echocardiogram    Echocardiogram (12/15): EF 55-60%, normal wall motion, PASP 32 mmHg   Hyperlipidemia    Hypertension    Hypokalemia    Obesity    Personal history of hypertensive heart disease    Personal history of long-term (current) use of anticoagulants    Shingles     Current Outpatient Medications  Medication Sig Dispense Refill   acetaminophen-codeine (TYLENOL #3) 300-30 MG tablet Take 1 tablet by mouth every 6 (six) hours. 12 tablet 0   amoxicillin (AMOXIL) 500 MG capsule Take 1 capsule (500 mg total) by mouth every 8 (eight) hours for 7 days. 21 capsule 0   amoxicillin (AMOXIL) 500 MG capsule Take 1 capsule (500 mg total) by mouth every 8 (eight) hours for 7 days 21 capsule 0   apixaban (ELIQUIS) 5 MG TABS tablet Take 1 tablet by mouth 2  times daily. 180 tablet 1   Cholecalciferol (VITAMIN D) 2000 UNITS CAPS Take 2,000 Units by mouth every evening.     clindamycin (CLEOCIN) 300 MG capsule Take 1 capsule (300 mg total) by mouth 4 (four) times daily. 40 capsule 0   ferrous sulfate 325 (65 FE) MG EC tablet Take 1 tablet by mouth once a day 90 tablet 3   hydrALAZINE (APRESOLINE) 25 MG tablet Take 1 tablet (25 mg total) by mouth 3 (three) times daily. 270 tablet 3   hydrochlorothiazide (HYDRODIURIL) 25 MG tablet Take 1 tablet (25 mg total) by mouth daily. 90 tablet 2   HYDROcodone-acetaminophen (NORCO) 7.5-325 MG tablet Take 1 tablet by mouth every 4-6 hours as needed for pain. 16 tablet 0   HYDROcodone-acetaminophen (NORCO) 7.5-325 MG tablet Take 1 tablet by mouth every 4-6 hours as needed for pain 16 tablet 0   ibuprofen  (ADVIL) 800 MG tablet Take 1 tablet (800 mg total) by mouth every 6 (six) hours as needed for pain 21 tablet 0   methylPREDNISolone (MEDROL) 4 MG TBPK tablet Take 6 tablets on day 1 then decrease by 1 tablet daily until finished (6-5-4-3-2-1) 21 tablet 0   metoprolol succinate (TOPROL-XL) 100 MG 24 hr tablet Take 1 tablet (100 mg total) by mouth daily. 90 tablet 1   metoprolol succinate (TOPROL-XL) 100 MG 24 hr tablet Take 1 tablet (100 mg total) by mouth daily. 90 tablet 3  Multiple Vitamins-Minerals (WOMENS 50+ MULTI VITAMIN) TABS Take 1 tablet by mouth daily.     oxyCODONE-acetaminophen (PERCOCET) 5-325 MG tablet Take 1 tablet by mouth every 4 (four) hours as needed. 10 tablet 0   Potassium Chloride ER 20 MEQ TBCR Take 1 tablet (20 mEq) by mouth daily with food. 90 tablet 0   Potassium Chloride ER 20 MEQ TBCR Take 1 tablet (20 mEq total) by mouth daily with food 90 tablet 3   rosuvastatin (CRESTOR) 10 MG tablet Take 1 tablet (10 mg total) by mouth every evening. 90 tablet 3   rosuvastatin (CRESTOR) 10 MG tablet Take 1 tablet (10 mg total) by mouth every evening. 90 tablet 3   No current facility-administered medications for this visit.    Allergies:   Lipitor [atorvastatin calcium], Spironolactone, Ace inhibitors, Losartan potassium, Omeprazole, and Ibuprofen   Social History:  The patient  reports that she has never smoked. She has never used smokeless tobacco. She reports that she does not drink alcohol and does not use drugs.   Family History:  The patient's family history includes Coronary artery disease in an other family member; Heart failure in her cousin; Hypertension in her son and another family member; Uterine cancer in her mother.   ROS:  Please see the history of present illness.   She denies any bleeding problems. She has had some recent URI symptoms. She denies fever.   All other systems reviewed and negative.   Physical Exam: There were no vitals taken for this visit.  No  BP recorded.  {Refresh Note OR Click here to enter BP  :1}***    GEN:  Well nourished, well developed in no acute distress HEENT: Normal NECK: No JVD; No carotid bruits LYMPHATICS: No lymphadenopathy CARDIAC: RRR ***, no murmurs, rubs, gallops RESPIRATORY:  Clear to auscultation without rales, wheezing or rhonchi  ABDOMEN: Soft, non-tender, non-distended MUSCULOSKELETAL:  No edema; No deformity  SKIN: Warm and dry NEUROLOGIC:  Alert and oriented x 3    EKG:       ASSESSMENT AND PLAN:  1. Hypertension -      2. Hyperlipidemia -      3. Paroxysmal atrial fibrillation -     4. Obesity -   5. Obstructive sleep apnea -       Kristeen Miss, MD  12/03/2023 9:25 PM    [facility] [address] [place],  [address] [place], [place]  [address] Pager [phone] Phone: [phone]; Fax: [phone]

## 2023-12-04 ENCOUNTER — Ambulatory Visit: Payer: Medicare PPO | Attending: Cardiovascular Disease | Admitting: Cardiovascular Disease

## 2023-12-04 ENCOUNTER — Encounter: Payer: Self-pay | Admitting: Cardiovascular Disease

## 2023-12-04 VITALS — BP 136/60 | HR 72 | Ht 63.0 in | Wt 244.0 lb

## 2023-12-04 DIAGNOSIS — I10 Essential (primary) hypertension: Secondary | ICD-10-CM | POA: Diagnosis not present

## 2023-12-04 NOTE — Patient Instructions (Signed)
 Follow-Up: At [facility], you and your health needs are our priority.  As part of our continuing mission to provide you with exceptional heart care, we have created designated Provider Care Teams.  These Care Teams include your primary Cardiologist (physician) and Advanced Practice Providers (APPs -  Physician Assistants and Nurse Practitioners) who all work together to provide you with the care you need, when you need it.  We recommend signing up for the patient portal called "MyChart".  Sign up information is provided on this After Visit Summary.  MyChart is used to connect with patients for Virtual Visits (Telemedicine).  Patients are able to view lab/test results, encounter notes, upcoming appointments, etc.  Non-urgent messages can be sent to your provider as well.   To learn more about what you can do with MyChart, go to [email or web address].    Your next appointment:   1 year(s)  Provider:   Kristeen Miss, MD

## 2023-12-10 ENCOUNTER — Other Ambulatory Visit (HOSPITAL_COMMUNITY): Payer: Self-pay

## 2023-12-17 ENCOUNTER — Ambulatory Visit
Admission: RE | Admit: 2023-12-17 | Discharge: 2023-12-17 | Disposition: A | Discharge status: Home / Self Care | Payer: Medicare PPO | Source: Ambulatory Visit | Attending: Family Medicine | Admitting: Family Medicine

## 2023-12-17 ENCOUNTER — Other Ambulatory Visit: Payer: Self-pay

## 2023-12-17 DIAGNOSIS — N958 Other specified menopausal and perimenopausal disorders: Secondary | ICD-10-CM | POA: Diagnosis not present

## 2023-12-17 DIAGNOSIS — E2839 Other primary ovarian failure: Secondary | ICD-10-CM | POA: Diagnosis not present

## 2023-12-24 DIAGNOSIS — M19011 Primary osteoarthritis, right shoulder: Secondary | ICD-10-CM | POA: Diagnosis not present

## 2023-12-29 ENCOUNTER — Other Ambulatory Visit (HOSPITAL_BASED_OUTPATIENT_CLINIC_OR_DEPARTMENT_OTHER): Payer: Self-pay

## 2023-12-29 ENCOUNTER — Other Ambulatory Visit: Payer: Self-pay | Admitting: Cardiovascular Disease

## 2023-12-31 ENCOUNTER — Other Ambulatory Visit (HOSPITAL_BASED_OUTPATIENT_CLINIC_OR_DEPARTMENT_OTHER): Payer: Self-pay

## 2023-12-31 MED ORDER — HYDROCHLOROTHIAZIDE 25 MG PO TABS
25.0000 mg | ORAL_TABLET | Freq: Every day | ORAL | 3 refills | Status: DC
  Filled 2023-12-31 – 2024-01-01 (×2): qty 90, 90d supply, fill #0
  Filled 2024-03-28: qty 30, 30d supply, fill #1
  Filled 2024-04-23: qty 30, 30d supply, fill #2
  Filled 2024-05-27: qty 30, 30d supply, fill #3
  Filled 2024-06-24: qty 30, 30d supply, fill #4
  Filled 2024-07-28: qty 30, 30d supply, fill #0
  Filled 2024-08-26: qty 30, 30d supply, fill #1
  Filled 2024-09-22: qty 30, 30d supply, fill #2
  Filled 2024-10-13 – 2024-10-15 (×2): qty 30, 30d supply, fill #3
  Filled 2024-11-18: qty 30, 30d supply, fill #4

## 2024-01-01 ENCOUNTER — Other Ambulatory Visit (HOSPITAL_BASED_OUTPATIENT_CLINIC_OR_DEPARTMENT_OTHER): Payer: Self-pay

## 2024-01-01 ENCOUNTER — Other Ambulatory Visit (HOSPITAL_COMMUNITY): Payer: Self-pay

## 2024-01-20 NOTE — Progress Notes (Deleted)
HPI female never smoker followed for OSA, complicated by atrial fib/Coumadin, GERD, HBP NPSG 02/11/17      AHI 30.9/ hr, desaturation to 85%, CPAP titrated to 10 cwp,    Body weight 258 lbs  ----------------------------------------------------------------------------   11/13/22- Coming to re-establish- 68 year old female never smoker followed for OSA, complicated by Atrial Fib/eliquis, GERD, HTN, Morbid Obesity,  CPAP auto 5-20/ [employer] Download compliance- 0%, AHI 3.5/ hr Body weight today-244 lbs Covid vax- Flu vax- Complains CPAP makes her nose burn. We discussed humidification and nasal saline. Due for replacement machine- should give [place] a chancre to regroup.  01/22/24- 68 year old female never smoker followed for OSA, complicated by Atrial Fib/eliquis, GERD, HTN, Morbid Obesity,  CPAP auto 5-20/ [employer] Download compliance-  Body weight today-    ROS-see HPI   + = positive Constitutional:    weight loss, night sweats, fevers, chills, +fatigue, lassitude. HEENT:    headaches, difficulty swallowing, tooth/dental problems, sore throat,       sneezing, itching, ear ache, +nasal congestion, post nasal drip, snoring CV:    chest pain, orthopnea, PND, swelling in lower extremities, anasarca,                                                            dizziness, palpitations Resp:   shortness of breath with exertion or at rest.                productive cough,   non-productive cough, coughing up of blood.              change in color of mucus.  wheezing.   Skin:    rash or lesions. GI:  No-   heartburn, indigestion, abdominal pain, nausea, vomiting, diarrhea,                 change in bowel habits, loss of appetite GU: dysuria, change in color of urine, no urgency or frequency.   flank pain. MS:   joint pain, stiffness, decreased range of motion, back pain. Neuro-     nothing unusual Psych:  change in mood or affect.  depression or anxiety.   memory loss.  OBJ- Physical  Exam General- Alert, Oriented, Affect-appropriate, Distress- none acute, +morbidly obese  Skin- rash-none, lesions- none, excoriation- none Lymphadenopathy- none Head- atraumatic            Eyes- Gross vision intact, PERRLA, conjunctivae and secretions clear            Ears- Hearing, canals-normal            Nose-+ turbinate edema, no-Septal dev, mucus, polyps, erosion, perforation             Throat- Mallampati II-III , mucosa clear , drainage- none, tonsils- atrophic, has lower teeth, +upper full denture Neck- flexible , trachea midline, no stridor , thyroid nl, carotid no bruit+ Chest - symmetrical excursion , unlabored           Heart/CV- IRR + , no murmur , no gallop  , no rub, nl s1 s2                           - JVD- none , edema- none, stasis changes- none, varices- none  Lung- clear to P&A, wheeze- none, cough- none , dullness-none, rub- none           Chest wall-  Abd-  Br/ Gen/ Rectal- Not done, not indicated Extrem- cyanosis- none, clubbing, none, atrophy- none, strength- nl Neuro- grossly intact to observation

## 2024-01-22 ENCOUNTER — Ambulatory Visit: Payer: Medicare PPO | Admitting: Internal Medicine

## 2024-01-22 ENCOUNTER — Encounter: Payer: Self-pay | Admitting: Internal Medicine

## 2024-01-23 ENCOUNTER — Other Ambulatory Visit (HOSPITAL_COMMUNITY): Payer: Self-pay

## 2024-01-24 ENCOUNTER — Other Ambulatory Visit (HOSPITAL_COMMUNITY): Payer: Self-pay

## 2024-02-22 ENCOUNTER — Other Ambulatory Visit (HOSPITAL_COMMUNITY): Payer: Self-pay

## 2024-02-22 ENCOUNTER — Other Ambulatory Visit: Payer: Self-pay | Admitting: Physician Assistant

## 2024-02-22 ENCOUNTER — Other Ambulatory Visit: Payer: Self-pay | Admitting: Cardiovascular Disease

## 2024-02-22 DIAGNOSIS — I480 Paroxysmal atrial fibrillation: Secondary | ICD-10-CM

## 2024-02-22 MED ORDER — APIXABAN 5 MG PO TABS
5.0000 mg | ORAL_TABLET | Freq: Two times a day (BID) | ORAL | 1 refills | Status: DC
Start: 2024-02-22 — End: 2024-02-29
  Filled 2024-02-22: qty 180, 90d supply, fill #0

## 2024-02-22 MED ORDER — HYDRALAZINE HCL 25 MG PO TABS
25.0000 mg | ORAL_TABLET | Freq: Three times a day (TID) | ORAL | 3 refills | Status: DC
  Filled 2024-02-22: qty 270, 90d supply, fill #0
  Filled 2024-05-12: qty 129, 43d supply, fill #1
  Filled 2024-05-12: qty 141, 47d supply, fill #1

## 2024-02-22 NOTE — Telephone Encounter (Signed)
 Prescription refill request for Eliquis received. Indication:afib Last office visit:12/24 Scr:0.74  10/24 Age: 68 Weight:110.7  kg  Prescription refilled

## 2024-02-25 ENCOUNTER — Other Ambulatory Visit (HOSPITAL_COMMUNITY): Payer: Self-pay

## 2024-02-28 ENCOUNTER — Telehealth: Payer: Self-pay | Admitting: Pharmacist

## 2024-02-28 DIAGNOSIS — I480 Paroxysmal atrial fibrillation: Secondary | ICD-10-CM

## 2024-02-28 NOTE — Telephone Encounter (Signed)
 Patient stopped by the clinic reporting her Eliquis was over $700.  States she cannot afford this patient claims last year she did not pay for the Eliquis at all.  She reports that she met with someone who switched her to [facility] without her permission.  She denies ever having LIS.  She states she wants to switch back to [place] [facility].  She also states that she does not want to be on Eliquis thinks is causing her side effects and and says it is too much for her to take it twice a day.  She wants to either go to warfarin or Xarelto.  I asked her if it would be okay if I put in a referral to our social worker to help her sort out her insurance issue.  Patient is agreeable to this. advised I will speak with Dr. Elease Hashimoto about her concern about Eliquis.  We did discuss the Medicare payment plan but it sounds like if she has to pay much of anything for the Xarelto she would need to go to warfarin.

## 2024-02-29 ENCOUNTER — Telehealth: Payer: Self-pay | Admitting: Licensed Clinical Social Worker

## 2024-02-29 ENCOUNTER — Other Ambulatory Visit (HOSPITAL_COMMUNITY): Payer: Self-pay

## 2024-02-29 MED ORDER — WARFARIN SODIUM 5 MG PO TABS
5.0000 mg | ORAL_TABLET | Freq: Every day | ORAL | 0 refills | Status: DC
  Filled 2024-02-29: qty 30, 30d supply, fill #0

## 2024-02-29 NOTE — Telephone Encounter (Signed)
 Apt moved to Thursday at 2:30

## 2024-02-29 NOTE — Telephone Encounter (Signed)
 Will need to move her appointment actually to Thursday the 13th at 1030.  I called patient and left a voicemail, asked her to call back to confirm this is okay.

## 2024-02-29 NOTE — Progress Notes (Signed)
 Heart and Vascular Care Navigation  02/29/2024  Jacqueline Petersen 04-08-1956 [id number]  Reason for Referral:            Medication Cost Concerns/Insurance Issues Patient is participating in a Managed Medicaid Plan: No, self pay only  Engaged with patient by telephone for initial visit for Heart and Vascular Care Coordination.                                                                                                   Assessment:                     LCSW was able to reach pt today at [phone]. Introduced self, role, reason for call. Updated address to [address], [place], [address]. Confirmed PCP, resides with spouse, has adult sons. Currently enrolled with [facility] and has concerns about costs of medications under that plan. Shares she has concerns about her rental as it doesn't have working heat, Best boy. She does have access to transportation, would like food resources at this time.   We discussed how to contact [facility] team at [employer] to discuss assistance with Medicare issues, advice on medication cost concerns. We also discussed referral to [facility] for Housing for heating concerns, agreeable to [facility] referral. Will also send information about food pantries in the area. Pt does not receive any assistance at this time. No additional questions when we hung up- plans to call [employer]. See below for outcome of those calls.                   HRT/VAS Care Coordination     Patients Home Cardiology Office [facility]   Outpatient Care Team Social Worker   Social Worker Name: Octavio Graves, [place], [phone]   Living arrangements for the past 2 months Single Family Home   Lives with: Spouse; Adult Children   Patient Current Insurance Coverage Managed Medicare   Patient Has Concern With Paying Medical Bills No   Does Patient Have Prescription Coverage? Yes   Home Assistive Devices/Equipment CBG Meter       Social  History:                                                                             SDOH Screenings   Food Insecurity: Food Insecurity Present (02/29/2024)  Housing: High Risk (02/29/2024)  Transportation Needs: No Transportation Needs (02/29/2024)  Utilities: Not At Risk (02/29/2024)  Financial Resource Strain: Medium Risk (02/29/2024)  Social Connections: Unknown (10/31/2022)   Received from [facility], [facility]  Tobacco Use: Low Risk  (12/04/2023)  Health Literacy: Adequate Health Literacy (02/29/2024)    SDOH Interventions: Financial Resources:  Surveyor, quantity Strain Interventions: [employer] Provided, [id number] Referral  Food Insecurity:  Geophysicist/field seismologist  Interventions: [employer] Provided ([employer] assistance; food Radiographer, therapeutic)  Housing Insecurity:  Housing Interventions: Programmer, applications Provided (referrral to [employer] for Housing for concerns with heating/cooling systems)  Transportation:   Transportation Interventions: Intervention Not Indicated     Other Care Navigation Interventions:     Provided Pharmacy assistance resources  Provided pt with [employer] information to complete Extra Help application and speak with them about changing plans if needed for coverage. Pt had concerns about eliquis- directed those to pharmD/provider  Patient expressed Mental Health concerns No.   Follow-up plan:   LCSW completed referral to [facility] for Housing for heating concerns. Contractor and food assistance through [employer]. Pt called back several times regarding [employer] and was unable to complete Extra Help with their assistance due to social security information not matching what was on file. Encouraged her to call Social Security directly and provided number. Advised her Medicare Advantage plans can be changed through end of March. Pt also will be contacted by PharmD to discuss medications.  Will f/u to ensure she receives food resources and  answer any additional questions.

## 2024-02-29 NOTE — Telephone Encounter (Signed)
 I spoke with patient.  She states she does not like taking Eliquis twice a day and she feels like her hands get numb and tingly and occasionally has a hard time going to the bathroom associates this with Eliquis.  Advised that I am not sure if the symptoms are from the Eliquis but if she wants to switch we can get her transitioned over.  She has about 47 tablets of Eliquis at home but does not want to continue to take it.  Until she gets her in insurance situation sorted out she will need to go to warfarin.  Will start warfarin 5 mg daily with INR check on Wednesday 3/12. Of note she was on warfarin in 2020.  Her dose at that time was 5 mg daily except 2 days a week she took 2.5 mg.  Rx called into [facility].  She did take her Eliquis this morning, I advised is okay to take her warfarin tonight.  I will not overlap any further as I feel it may cause confusion for patient.

## 2024-03-05 ENCOUNTER — Encounter

## 2024-03-06 ENCOUNTER — Ambulatory Visit: Attending: Cardiology

## 2024-03-06 DIAGNOSIS — I4891 Unspecified atrial fibrillation: Secondary | ICD-10-CM | POA: Diagnosis not present

## 2024-03-06 DIAGNOSIS — Z7901 Long term (current) use of anticoagulants: Secondary | ICD-10-CM

## 2024-03-06 LAB — POCT INR: INR: 2 (ref 2.0–3.0)

## 2024-03-06 NOTE — Patient Instructions (Addendum)
 Description   Continue taking 1 tablet daily.  Stay consistent with greens each week Coumadin Clinic [phone]     A full discussion of the nature of anticoagulants has been carried out.  A benefit risk analysis has been presented to the patient, so that they understand the justification for choosing anticoagulation at this time. The need for frequent and regular monitoring, precise dosage adjustment and compliance is stressed.  Side effects of potential bleeding are discussed.  The patient should avoid any OTC items containing aspirin or ibuprofen, and should avoid great swings in general diet.  Avoid alcohol consumption.  Call if any signs of abnormal bleeding.

## 2024-03-13 ENCOUNTER — Ambulatory Visit: Attending: Cardiovascular Disease

## 2024-03-13 DIAGNOSIS — I4891 Unspecified atrial fibrillation: Secondary | ICD-10-CM | POA: Diagnosis not present

## 2024-03-13 LAB — POCT INR: INR: 3.1 — AB (ref 2.0–3.0)

## 2024-03-13 NOTE — Patient Instructions (Signed)
 Description   START taking 1 tablet daily EXCEPT 1/2 on Fridays.  Stay consistent with greens each week Coumadin Clinic [phone]

## 2024-03-17 ENCOUNTER — Other Ambulatory Visit: Payer: Self-pay | Admitting: Family Medicine

## 2024-03-17 DIAGNOSIS — Z1231 Encounter for screening mammogram for malignant neoplasm of breast: Secondary | ICD-10-CM

## 2024-03-21 ENCOUNTER — Ambulatory Visit: Attending: Cardiovascular Disease

## 2024-03-21 DIAGNOSIS — I4891 Unspecified atrial fibrillation: Secondary | ICD-10-CM | POA: Diagnosis not present

## 2024-03-21 DIAGNOSIS — Z7901 Long term (current) use of anticoagulants: Secondary | ICD-10-CM

## 2024-03-21 LAB — POCT INR: INR: 4.1 — AB (ref 2.0–3.0)

## 2024-03-21 NOTE — Patient Instructions (Signed)
 Description   Skip tomorrow's dosage of Warfarin, then start taking 1 tablet daily except 1/2 tablet on Mondays and Fridays.   Recheck in 1 week.  Stay consistent with greens each week Coumadin Clinic [phone]

## 2024-03-28 ENCOUNTER — Other Ambulatory Visit: Payer: Self-pay | Admitting: Cardiovascular Disease

## 2024-03-28 ENCOUNTER — Ambulatory Visit: Attending: Cardiovascular Disease

## 2024-03-28 ENCOUNTER — Other Ambulatory Visit (HOSPITAL_COMMUNITY): Payer: Self-pay

## 2024-03-28 DIAGNOSIS — I4891 Unspecified atrial fibrillation: Secondary | ICD-10-CM

## 2024-03-28 LAB — POCT INR: INR: 1.9 — AB (ref 2.0–3.0)

## 2024-03-28 MED ORDER — WARFARIN SODIUM 5 MG PO TABS
ORAL_TABLET | ORAL | 0 refills | Status: DC
Start: 2024-03-28 — End: 2024-07-01
  Filled 2024-03-28: qty 90, 90d supply, fill #0

## 2024-03-28 NOTE — Telephone Encounter (Signed)
 Prescription refill request received for warfarin Lov: 12/04/23 (Jacqueline Petersen)  Next INR check: 04/04/24 Warfarin tablet strength: 5mg   Appropriate dose. Refill sent.

## 2024-03-28 NOTE — Patient Instructions (Signed)
 Description   Take an extra 1/2 tablet today and then resume taking 1 tablet daily except 1/2 tablet on Mondays and Fridays.   Recheck in 1 week.  Stay consistent with greens each week Coumadin Clinic [phone]

## 2024-03-29 ENCOUNTER — Other Ambulatory Visit (HOSPITAL_COMMUNITY): Payer: Self-pay

## 2024-04-04 ENCOUNTER — Ambulatory Visit: Attending: Cardiovascular Disease | Admitting: *Deleted

## 2024-04-04 DIAGNOSIS — Z7901 Long term (current) use of anticoagulants: Secondary | ICD-10-CM | POA: Diagnosis not present

## 2024-04-04 DIAGNOSIS — I4891 Unspecified atrial fibrillation: Secondary | ICD-10-CM

## 2024-04-04 LAB — POCT INR: INR: 2.3 (ref 2.0–3.0)

## 2024-04-04 NOTE — Patient Instructions (Addendum)
 Description   Continue taking warfarin 1 tablet daily except 1/2 tablet on Mondays and Fridays.   Recheck in 1 week.  Stay consistent with greens each week Coumadin Clinic [phone]

## 2024-04-09 ENCOUNTER — Ambulatory Visit

## 2024-04-16 ENCOUNTER — Ambulatory Visit
Admission: RE | Admit: 2024-04-16 | Discharge: 2024-04-16 | Disposition: A | Discharge status: Home / Self Care | Source: Ambulatory Visit | Attending: Family Medicine | Admitting: Family Medicine

## 2024-04-16 DIAGNOSIS — Z1231 Encounter for screening mammogram for malignant neoplasm of breast: Secondary | ICD-10-CM | POA: Diagnosis not present

## 2024-04-22 ENCOUNTER — Telehealth: Payer: Self-pay | Admitting: *Deleted

## 2024-04-22 NOTE — Telephone Encounter (Signed)
 Called pt to remind that her Anticoagulation Appt is tomorrow at the new location: [address] she verbalized understanding.

## 2024-04-23 ENCOUNTER — Emergency Department (HOSPITAL_COMMUNITY)
Admission: EM | Admit: 2024-04-23 | Discharge: 2024-04-23 | Disposition: A | Discharge status: Home / Self Care | Attending: Emergency Medicine | Admitting: Emergency Medicine

## 2024-04-23 ENCOUNTER — Other Ambulatory Visit: Payer: Self-pay

## 2024-04-23 ENCOUNTER — Encounter (HOSPITAL_COMMUNITY): Payer: Self-pay

## 2024-04-23 ENCOUNTER — Emergency Department (HOSPITAL_COMMUNITY)
Admission: EM | Admit: 2024-04-23 | Discharge: 2024-04-23 | Disposition: A | Discharge status: Home / Self Care | Source: Home / Self Care | Attending: Emergency Medicine | Admitting: Emergency Medicine

## 2024-04-23 ENCOUNTER — Other Ambulatory Visit (HOSPITAL_COMMUNITY): Payer: Self-pay

## 2024-04-23 ENCOUNTER — Emergency Department (HOSPITAL_COMMUNITY)

## 2024-04-23 ENCOUNTER — Ambulatory Visit: Attending: Cardiovascular Disease | Admitting: *Deleted

## 2024-04-23 ENCOUNTER — Encounter (HOSPITAL_COMMUNITY): Payer: Self-pay | Admitting: Emergency Medicine

## 2024-04-23 ENCOUNTER — Ambulatory Visit (HOSPITAL_COMMUNITY)
Admission: EM | Admit: 2024-04-23 | Discharge: 2024-04-23 | Disposition: A | Discharge status: Home / Self Care | Attending: Sports Medicine | Admitting: Sports Medicine

## 2024-04-23 DIAGNOSIS — R531 Weakness: Secondary | ICD-10-CM

## 2024-04-23 DIAGNOSIS — Z79899 Other long term (current) drug therapy: Secondary | ICD-10-CM | POA: Insufficient documentation

## 2024-04-23 DIAGNOSIS — I4891 Unspecified atrial fibrillation: Secondary | ICD-10-CM | POA: Diagnosis not present

## 2024-04-23 DIAGNOSIS — I10 Essential (primary) hypertension: Secondary | ICD-10-CM | POA: Insufficient documentation

## 2024-04-23 DIAGNOSIS — Z7901 Long term (current) use of anticoagulants: Secondary | ICD-10-CM

## 2024-04-23 DIAGNOSIS — R791 Abnormal coagulation profile: Secondary | ICD-10-CM | POA: Diagnosis not present

## 2024-04-23 DIAGNOSIS — R0989 Other specified symptoms and signs involving the circulatory and respiratory systems: Secondary | ICD-10-CM | POA: Diagnosis not present

## 2024-04-23 DIAGNOSIS — I517 Cardiomegaly: Secondary | ICD-10-CM | POA: Diagnosis not present

## 2024-04-23 DIAGNOSIS — R0602 Shortness of breath: Secondary | ICD-10-CM | POA: Diagnosis not present

## 2024-04-23 DIAGNOSIS — R5383 Other fatigue: Secondary | ICD-10-CM | POA: Diagnosis not present

## 2024-04-23 DIAGNOSIS — R002 Palpitations: Secondary | ICD-10-CM | POA: Diagnosis present

## 2024-04-23 LAB — CBC WITH DIFFERENTIAL/PLATELET
Abs Immature Granulocytes: 0.03 10*3/uL (ref 0.00–0.07)
Basophils Absolute: 0.1 10*3/uL (ref 0.0–0.1)
Basophils Relative: 1 %
Eosinophils Absolute: 0.2 10*3/uL (ref 0.0–0.5)
Eosinophils Relative: 2 %
HCT: 43.6 % (ref 36.0–46.0)
Hemoglobin: 14.1 g/dL (ref 12.0–15.0)
Immature Granulocytes: 0 %
Lymphocytes Relative: 39 %
Lymphs Abs: 3.3 10*3/uL (ref 0.7–4.0)
MCH: 29 pg (ref 26.0–34.0)
MCHC: 32.3 g/dL (ref 30.0–36.0)
MCV: 89.5 fL (ref 80.0–100.0)
Monocytes Absolute: 0.5 10*3/uL (ref 0.1–1.0)
Monocytes Relative: 6 %
Neutro Abs: 4.5 10*3/uL (ref 1.7–7.7)
Neutrophils Relative %: 52 %
Platelets: 344 10*3/uL (ref 150–400)
RBC: 4.87 MIL/uL (ref 3.87–5.11)
RDW: 14.2 % (ref 11.5–15.5)
WBC: 8.6 10*3/uL (ref 4.0–10.5)
nRBC: 0 % (ref 0.0–0.2)

## 2024-04-23 LAB — POCT INR: INR: 4.1 — AB (ref 2.0–3.0)

## 2024-04-23 LAB — BASIC METABOLIC PANEL WITH GFR
Anion gap: 10 (ref 5–15)
BUN: 18 mg/dL (ref 8–23)
CO2: 25 mmol/L (ref 22–32)
Calcium: 9 mg/dL (ref 8.9–10.3)
Chloride: 103 mmol/L (ref 98–111)
Creatinine, Ser: 0.79 mg/dL (ref 0.44–1.00)
GFR, Estimated: >60 mL/min (ref >60)
Glucose, Bld: 109 mg/dL — ABNORMAL HIGH (ref 70–99)
Potassium: 3.5 mmol/L (ref 3.5–5.1)
Sodium: 138 mmol/L (ref 135–145)

## 2024-04-23 LAB — MAGNESIUM: Magnesium: 1.9 mg/dL (ref 1.7–2.4)

## 2024-04-23 MED ORDER — METOPROLOL TARTRATE 5 MG/5ML IV SOLN
5.0000 mg | Freq: Once | INTRAVENOUS | Status: AC
  Administered 2024-04-23: 5 mg via INTRAVENOUS
  Filled 2024-04-23: qty 5

## 2024-04-23 MED ORDER — SODIUM CHLORIDE 0.9 % IV BOLUS
500.0000 mL | Freq: Once | INTRAVENOUS | Status: AC
  Administered 2024-04-23: 500 mL via INTRAVENOUS

## 2024-04-23 NOTE — ED Notes (Signed)
 Help get patient on the monitor did EKG shown to Dr Dolan Freiberg patient is resting with call bell in reach

## 2024-04-23 NOTE — ED Notes (Signed)
Report given to Aundra Millet, RN CN at [facility].

## 2024-04-23 NOTE — ED Triage Notes (Signed)
 Pt c/o weakness, fatigue, and off balance x1wk. C/o ankle swelling and feeling of her heart beating through her body. Hx of Afib and feels similar. States also started drinking soda again x1wk.states went to Warfarin clinic this am and states her numbers are off.

## 2024-04-23 NOTE — Discharge Instructions (Signed)
 You were seen in the emerged from today with atrial fibrillation.  Please continue your home medications.  The INR clinic made recommendations which you should follow regarding controlling your INR.  You should be hearing from the cardiology office in the next 24 to 48 hours for a follow-up appointment.  If you do not hear from them today please call.  If you develop any new or suddenly worsening symptoms please return to the emergency department immediately for evaluation.

## 2024-04-23 NOTE — Discharge Instructions (Signed)
 It was a pleasure treating you this evening.  Please follow-up with your cardiologist for further management as needed.

## 2024-04-23 NOTE — ED Triage Notes (Addendum)
 Pt returned after being DC because she feels like her heart is in afib worse than when she was here.  They discussed possible admission earlier and she elected to go home but was told to return if she felt like things were worse. Pt took 100 mg metoprolol  prior to coming.

## 2024-04-23 NOTE — Patient Instructions (Signed)
 Description   Do not take any warfarin tomorrow (already taken today's dose) then continue taking warfarin 1 tablet daily except 1/2 tablet on Mondays and Fridays.   Recheck in 1 week.  Stay consistent with greens each week Coumadin  Clinic [phone]

## 2024-04-23 NOTE — ED Notes (Signed)
 Patient is being discharged from the Urgent Care and sent to the Emergency Department via carelink . Per Dr. Curriero, patient is in need of higher level of care due to AFIB. Patient is aware and verbalizes understanding of plan of care.  Vitals:   04/23/24 0855  BP: 106/81  Pulse: 86  Resp: 20  Temp: 98.1 F (36.7 C)  SpO2: 96%

## 2024-04-23 NOTE — ED Provider Notes (Signed)
 Emergency Department Provider Note   I have reviewed the triage vital signs and the nursing notes.   HISTORY  Chief Complaint Palpitations (Pt Presents from [facility] for palpitations and weakness x 1 week. Pt has a history of a-fib but cannot normally feel when she is in it. Chest pain began this morning that she describes more as palpitations than pain.  )   HPI Jacqueline Petersen is a 68 y.o. female with past history of A-fib on Coumadin  and metoprolol  presents emergency department with shortness of breath and heart palpitations over the past 4 days.  Symptoms have been constant but not specifically worsening.  She denies any chest discomfort of any kind.  No fevers or chills.  She is compliant with her home medications including her Coumadin .  She had her INR checked at the clinic this morning and it was found to be 4.1 with plans to skip her next dose and watch her diet.    Past Medical History:  Diagnosis Date   A-fib (HCC)    Acid reflux    Arthritis    Dyslipidemia    Fatigue    GERD (gastroesophageal reflux disease)    History of hysterectomy    Hx of echocardiogram    Echocardiogram (12/15): EF 55-60%, normal wall motion, PASP 32 mmHg   Hyperlipidemia    Hypertension    Hypokalemia    Obesity    Personal history of hypertensive heart disease    Personal history of Avyukt Cimo-term (current) use of anticoagulants    Shingles     Review of Systems  Constitutional: No fever/chills Cardiovascular: Denies chest pain. Positive palpitations.  Respiratory: Mild shortness of breath. Gastrointestinal: No abdominal pain.  Musculoskeletal: Negative for back pain. Skin: Negative for rash. Neurological: Negative for headaches.  ____________________________________________   PHYSICAL EXAM:  VITAL SIGNS: HR: 98 Pulse Ox: 98 % RA BP: 134/67 Temp: 98.7 F  Constitutional: Alert and oriented. Well appearing and in no acute distress. Eyes: Conjunctivae are normal.  Head:  Atraumatic. Nose: No congestion/rhinnorhea. Mouth/Throat: Mucous membranes are moist. Neck: No stridor. Cardiovascular: A fib. Good peripheral circulation. Grossly normal heart sounds.   Respiratory: Normal respiratory effort.  No retractions. Lungs CTAB. Gastrointestinal: Soft and nontender. No distention.  Musculoskeletal: No lower extremity tenderness with trace bilateral LE edema.  Neurologic:  Normal speech and language.  Skin:  Skin is warm, dry and intact. No rash noted.  ____________________________________________   LABS (all labs ordered are listed, but only abnormal results are displayed)  Labs Reviewed  BASIC METABOLIC PANEL WITH GFR - Abnormal; Notable for the following components:      Result Value   Glucose, Bld 109 (*)    All other components within normal limits  CBC WITH DIFFERENTIAL/PLATELET  MAGNESIUM    ____________________________________________  EKG  Rate: 81 A fib. No STEMI.   ____________________________________________  RADIOLOGY  DG Chest 2 View Result Date: 04/23/2024 CLINICAL DATA:  Shortness of breath. EXAM: CHEST - 2 VIEW COMPARISON:  Chest radiograph dated 09/27/2023. FINDINGS: Mild cardiomegaly with mild central vascular congestion. No focal consolidation, pleural effusion or pneumothorax. Degenerative changes of the spine. No acute osseous pathology. IMPRESSION: Mild cardiomegaly with mild central vascular congestion. Electronically Signed   By: Angus Bark M.D.   On: 04/23/2024 11:17    ____________________________________________   PROCEDURES  Procedure(s) performed:   Procedures  None  ____________________________________________   INITIAL IMPRESSION / ASSESSMENT AND PLAN / ED COURSE  Pertinent labs & imaging results that were available  during my care of the patient were reviewed by me and considered in my medical decision making (see chart for details).   This patient is Presenting for Evaluation of palpitations, which  does require a range of treatment options, and is a complaint that involves a high risk of morbidity and mortality.  The Differential Diagnoses include A fib, CHF, PE, ACS, etc.  Critical Interventions-    Medications  sodium chloride  0.9 % bolus 500 mL (0 mLs Intravenous Stopped 04/23/24 1315)  metoprolol  tartrate (LOPRESSOR ) injection 5 mg (5 mg Intravenous Given 04/23/24 1028)    Reassessment after intervention: HR improved but remains in a fib.   I decided to review pertinent External Data, and in summary INR from this AM is 4.1.   Clinical Laboratory Tests Ordered, included BMP normal creatinine.  Mag and potassium normal.  No anemia.  Radiologic Tests Ordered, included CXR. I independently interpreted the images and agree with radiology interpretation.   Cardiac Monitor Tracing which shows A fib.   Social Determinants of Health Risk patient is a non-smoker  Consult complete with Cardiology. Discussed ED cardioversion offered and declined by patient. They will arrange close follow up.   Medical Decision Making: Summary:  Patient presents to the emergency department for evaluation of heart palpitations over the past 4 days with shortness of breath.  Arrives in A-fib.  INR is therapeutic, actually supratherapeutic at 4.1 this morning.  Fairly minimal symptoms.  Plan for metoprolol  and screening blood work looking for electrolyte abnormality.  May be a candidate for cardioversion.  Reevaluation with update and discussion with patient.  Offered/recommended ED cardioversion but she is minimally symptomatic.  Heart rate is in the 90s.  No hypotension.  She has a strong preference to follow with her cardiologist as an outpatient rather than undergoing any cardioversion.  She does not appear acutely volume overloaded or to be an decompensated CHF.  I will consult with cardiology to make them aware and see if we can arrange close outpatient follow-up.  Considered admission but HR controlled  and patient with close Cardiology follow up.   Patient's presentation is most consistent with acute presentation with potential threat to life or bodily function.   Disposition: discharge  ____________________________________________  FINAL CLINICAL IMPRESSION(S) / ED DIAGNOSES  Final diagnoses:  Atrial fibrillation, unspecified type [facility])    Note:  This document was prepared using Dragon voice recognition software and may include unintentional dictation errors.  Abby Hocking, MD, [facility] Emergency Medicine    Rajendra Spiller, Shereen Dike, MD 04/24/24 [phone]

## 2024-04-23 NOTE — ED Provider Notes (Signed)
 [place] EMERGENCY DEPARTMENT AT [facility] Provider Note   CSN: [id number] Arrival date & time: 04/23/24  1831     History  Chief Complaint  Patient presents with   Atrial Fibrillation    Jacqueline Petersen is a 68 y.o. female.  Patient with history of atrial fibrillation, seen earlier today for the same presents to the emergency room concerned about her atrial fibrillation.  Earlier today she was evaluated and they discussed possible cardioversion with the patient declined, electing to follow-up with her cardiologist.  She states that after going home she continued to feel her atrial fibrillation and took her metoprolol  prior to coming to the emergency department.  As of the time of my assessment the patient is now asymptomatic and feels well.  She is still in atrial fibrillation but states she feels comfortable and would like to go home and follow-up with cardiology.   Atrial Fibrillation       Home Medications Prior to Admission medications   Medication Sig Start Date End Date Taking? Authorizing Provider  acetaminophen -codeine  (TYLENOL  #3) 300-30 MG tablet Take 1 tablet by mouth every 6 (six) hours. 04/10/23     Cholecalciferol (VITAMIN D) 2000 UNITS CAPS Take 2,000 Units by mouth every evening.    [provider]  ferrous sulfate  325 (65 FE) MG EC tablet Take 1 tablet by mouth once a day 04/12/22     hydrALAZINE  (APRESOLINE ) 25 MG tablet Take 1 tablet (25 mg total) by mouth 3 (three) times daily. 02/22/24   Nahser, Lela Purple, MD  hydrochlorothiazide  (HYDRODIURIL ) 25 MG tablet Take 1 tablet (25 mg total) by mouth daily. 12/31/23   Nahser, Lela Purple, MD  metoprolol  succinate (TOPROL -XL) 100 MG 24 hr tablet Take 1 tablet (100 mg total) by mouth daily. 11/07/23     Multiple Vitamins-Minerals (WOMENS 50+ MULTI VITAMIN) TABS Take 1 tablet by mouth daily.    [provider]  Potassium Chloride  ER 20 MEQ TBCR Take 1 tablet (20 mEq total) by mouth daily  with food 11/07/23     rosuvastatin  (CRESTOR ) 10 MG tablet Take 1 tablet (10 mg total) by mouth every evening. 11/07/23     warfarin (COUMADIN ) 5 MG tablet TAKE 1/2 TABLET TO 1 TABLET BY MOUTH DAILY AS DIRECTED BY COUMADIN  CLINIC 03/28/24   Nahser, Lela Purple, MD      Allergies    Lipitor [atorvastatin calcium ], Spironolactone , Ace inhibitors, Losartan  potassium, Omeprazole , and Ibuprofen     Review of Systems   Review of Systems  Physical Exam Updated Vital Signs BP (!) 150/89   Pulse 85   Temp 98.3 F (36.8 C) (Oral)   Resp 15   Ht 5\' 3"  (1.6 m)   Wt 117.9 kg   SpO2 100%   BMI 46.04 kg/m  Physical Exam Vitals and nursing note reviewed.  HENT:     Head: Normocephalic and atraumatic.  Eyes:     Conjunctiva/sclera: Conjunctivae normal.  Cardiovascular:     Rate and Rhythm: Normal rate. Rhythm irregular.  Pulmonary:     Effort: Pulmonary effort is normal. No respiratory distress.     Breath sounds: Normal breath sounds.  Musculoskeletal:        General: No signs of injury.     Cervical back: Normal range of motion.  Skin:    General: Skin is dry.  Neurological:     Mental Status: She is alert.  Psychiatric:        Speech: Speech normal.  Behavior: Behavior normal.     ED Results / Procedures / Treatments   Labs (all labs ordered are listed, but only abnormal results are displayed) Labs Reviewed - No data to display  EKG None  Radiology DG Chest 2 View Result Date: 04/23/2024 CLINICAL DATA:  Shortness of breath. EXAM: CHEST - 2 VIEW COMPARISON:  Chest radiograph dated 09/27/2023. FINDINGS: Mild cardiomegaly with mild central vascular congestion. No focal consolidation, pleural effusion or pneumothorax. Degenerative changes of the spine. No acute osseous pathology. IMPRESSION: Mild cardiomegaly with mild central vascular congestion. Electronically Signed   By: Angus Bark M.D.   On: 04/23/2024 11:17    Procedures Procedures    Medications Ordered in  ED Medications - No data to display  ED Course/ Medical Decision Making/ A&P                                 Medical Decision Making  This patient presents to the ED for concern of dysrhythmia, this involves an extensive number of treatment options, and is a complaint that carries with it a high risk of complications and morbidity.  The differential diagnosis includes atrial fibrillation, others   Co morbidities that complicate the patient evaluation  History of atrial fibrillation   Additional history obtained:   External records from outside source obtained and reviewed including cardiology notes   Lab Tests:  I reviewed labs drawn earlier today  Imaging Studies ordered:  I reviewed the chest x-ray from earlier today   Cardiac Monitoring: / EKG:  The patient was maintained on a cardiac monitor.  I personally viewed and interpreted the cardiac monitored which showed an underlying rhythm of: Atrial fibrillation, rate controlled   Test / Admission - Considered:  Patient currently asymptomatic, still in atrial fibrillation but rate is controlled.  She feels better after taking her home medications.  She feels safe to discharge home and I believe this is perfectly reasonable.  Patient will continue to follow up outpatient with cardiology.  Return precautions have been provided.  I see no indication at this time for emergent cardioversion.         Final Clinical Impression(s) / ED Diagnoses Final diagnoses:  Atrial fibrillation, unspecified type [facility])    Rx / DC Orders ED Discharge Orders     None         Delories Fetter 04/23/24 2307    Onetha Bile, MD 05/01/24 2333

## 2024-04-23 NOTE — ED Provider Notes (Signed)
 [facility]    CSN: [id number] Arrival date & time: 04/23/24  0825      History   Chief Complaint Chief Complaint  Patient presents with   Weakness    HPI Jacqueline Petersen is a 68 y.o. female with PMH of Afib on Warfarin, Hypertension, Obesity, OSA with intermittent CPAP compliance here with 4 days of fatigue, weakness, palpitations, and dyspnea both at rest and with activity. States this started over the weekend and feels similar to previous Afib w/ RVR exacerbations. She denies recent illness, N/V, diarrhea, chest pain or pressure. She does have swelling in both lower extremities that is atypical for her. + orthopnea. Reports she hasn't been using her CPAP over the past week and dietary indiscretion with increased sweets and sodas before this past weekend. She denies any known hematuria, hematochezia or melena. Reports good adherence to medication regimen including her metoprolol  and hydrochlorothiazide .  She did go to the Warfarin clinic this AM and her INR was 4.1. Was told to hold her morning dose of Warfarin but had already taken today's dose. Knows to hold tomorrow's dose and was advised by their clinic to eat some leafy greens today.   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   Bilateral primary osteoarthritis of knee 01/03/2022   Iron  deficiency anemia 01/03/2022   Prediabetes  01/03/2022   Vitamin D deficiency 01/03/2022   Sedimentation rate elevation 01/03/2022   Elevated CK 01/03/2022   Bilateral hand pain 01/03/2022   Other fatigue 01/03/2022   Obstructive sleep apnea 07/17/2017   Morbid obesity due to excess calories (HCC) 07/17/2017   PVC's (premature ventricular contractions) 01/26/2016   Long term (current) use of anticoagulants 12/09/2013   Atrial fibrillation with RVR (HCC) 12/03/2013   Hyperlipidemia 06/12/2007   Essential hypertension 06/12/2007   ATRIAL FIBRILLATION 06/12/2007   GERD 06/12/2007    Past Surgical History:  Procedure Laterality Date   ABDOMINAL HYSTERECTOMY     CARDIAC CATHETERIZATION  04/15/2007   Est. EF of 50-55% -- Smooth and normal coronary arteries -- Normal left ventricular systolic function.  We will continue with further treatment of her atrial fibrillation      PARTIAL HYSTERECTOMY      OB History   No obstetric history on file.      Home Medications    Prior to Admission medications   Medication Sig Start Date End Date Taking? Authorizing Provider  acetaminophen -codeine  (TYLENOL  #3) 300-30 MG tablet Take 1 tablet by mouth every 6 (six) hours. 04/10/23     Cholecalciferol (VITAMIN D) 2000 UNITS CAPS Take 2,000 Units by mouth every evening.    [provider]  ferrous sulfate  325 (65 FE) MG EC tablet Take 1 tablet by mouth once a day 04/12/22     hydrALAZINE  (APRESOLINE ) 25  MG tablet Take 1 tablet (25 mg total) by mouth 3 (three) times daily. 02/22/24   Nahser, Lela Purple, MD  hydrochlorothiazide  (HYDRODIURIL ) 25 MG tablet Take 1 tablet (25 mg total) by mouth daily. 12/31/23   Nahser, Lela Purple, MD  metoprolol  succinate (TOPROL -XL) 100 MG 24 hr tablet Take 1 tablet (100 mg total) by mouth daily. 11/07/23     Multiple Vitamins-Minerals (WOMENS 50+ MULTI VITAMIN) TABS Take 1 tablet by mouth daily.    [provider]  Potassium Chloride  ER 20 MEQ TBCR Take 1 tablet (20 mEq total) by mouth daily with food  11/07/23     rosuvastatin  (CRESTOR ) 10 MG tablet Take 1 tablet (10 mg total) by mouth every evening. 11/07/23     warfarin (COUMADIN ) 5 MG tablet TAKE 1/2 TABLET TO 1 TABLET BY MOUTH DAILY AS DIRECTED BY COUMADIN  CLINIC 03/28/24   Nahser, Lela Purple, MD    Family History Family History  Problem Relation Age of Onset   Uterine cancer Mother    Hypertension Son    Heart failure Cousin    Coronary artery disease Other    Hypertension Other    Heart attack Neg Hx    Stroke Neg Hx     Social History Social History   Tobacco Use   Smoking status: Never   Smokeless tobacco: Never  Vaping Use   Vaping status: Never Used  Substance Use Topics   Alcohol use: No   Drug use: No     Allergies   Lipitor [atorvastatin calcium ], Spironolactone , Ace inhibitors, Losartan  potassium, Omeprazole , and Ibuprofen    Review of Systems Review of Systems  Neurological:  Positive for weakness.     Physical Exam  Updated Vital Signs BP 106/81 (BP Location: Left Arm)   Pulse 86   Temp 98.1 F (36.7 C) (Oral)   Resp 20   SpO2 96%   Physical Exam Constitutional:      General: She is not in acute distress.    Appearance: She is obese.  HENT:     Head: Normocephalic and atraumatic.     Right Ear: Tympanic membrane normal.     Left Ear: Tympanic membrane normal.     Nose: Nose normal. No congestion or rhinorrhea.     Mouth/Throat:     Mouth: Mucous membranes are moist.     Pharynx: Oropharynx is clear.  Eyes:     Extraocular Movements: Extraocular movements intact.     Conjunctiva/sclera: Conjunctivae normal.     Pupils: Pupils are equal, round, and reactive to light.  Neck:     Vascular: No carotid bruit.  Cardiovascular:     Rate and Rhythm: Tachycardia present. Rhythm irregular.     Pulses: Normal pulses.     Heart sounds: Normal heart sounds. No murmur heard.    No gallop.  Pulmonary:     Effort: Pulmonary effort is normal.     Breath sounds: Normal breath sounds. No wheezing,  rhonchi or rales.  Abdominal:     Palpations: Abdomen is soft.     Tenderness: There is no abdominal tenderness. There is no guarding.  Musculoskeletal:        General: Normal range of motion.     Cervical back: Normal range of motion and neck supple.     Right lower leg: Edema present.     Left lower leg: Edema present.  Skin:    General: Skin is warm.     Capillary Refill: Capillary refill takes less than  2 seconds.     Coloration: Skin is not jaundiced.     Findings: No rash.  Neurological:     General: No focal deficit present.     Mental Status: She is alert and oriented to person, place, and time. Mental status is at baseline.  Psychiatric:        Mood and Affect: Mood normal.        Behavior: Behavior normal.      UC Treatments / Results  Labs (all labs ordered are listed, but only abnormal results are displayed) Labs Reviewed - No data to display  EKG Afib RVR with Vent Rate 113bpm. Left axis deviation noted. No ST segment abnormalities concerning for ischemia.  Radiology No results found.  Procedures Procedures (including critical care time)  Medications Ordered in UC Medications - No data to display  Initial Impression / Assessment and Plan / UC Course  I have reviewed the triage vital signs and the nursing notes.  Pertinent labs & imaging results that were available during my care of the patient were reviewed by me and considered in my medical decision making (see chart for details).    Atrial fibrillation with rapid ventricular response (HCC)  Weakness  Fatigue, unspecified type  Supratherapeutic INR Vitals and triage reviewed, EKG obtained upon presentation which reflects Afib w/ RVR vent rate 110-120 both on EKG and exam. No evidence of ischemia on her EKG. This is most likely explanation for her fatigue, dyspnea, and weakness. Likely secondary to dietary indiscretion. She has maintained compliance with her medications. Morning Coumadin  clinic INR  reviewed and 4.1 which is supra therapeutic for her.  I reviewed with her recommendation for transfer to the ED as she requires a higher level of care than can be safely provided in the urgent can and she likely needs admission for rate control management.  No obvious source for ABLA, however would recommend evaluation of possible anemia in the ED. Patient's questions were answered and they are in agreement with this plan  Final Clinical Impressions(s) / UC Diagnoses   Final diagnoses:  Atrial fibrillation with rapid ventricular response (HCC)  Weakness  Fatigue, unspecified type  Supratherapeutic INR   Discharge Instructions   None    ED Prescriptions   None    PDMP not reviewed this encounter.   Marliss Simple, MD 04/23/24 [phone]

## 2024-04-29 NOTE — Progress Notes (Unsigned)
 Cardiology Office Note:    Date:  04/30/2024  ID:  Jacqueline Petersen, DOB 1956/07/31, MRN [id number] PCP: Sun, Vyvyan, MD  [facility] Providers Cardiologist:  Ahmad Alert, MD       Patient Profile:      Paroxysmal atrial fibrillation  PVCs  11% per Holter in 2016 Eval by EP (Dr. Rodolfo Clan) >> EF normal, SAECG normal >> improved with reduced caffeine  Echocardiogram 01/10/16: EF 55-60, mod LAE SAECG 2/16: Normal  Holter 2/16: NSR, Freq PVCs (11%) Echo (12/10/14):  EF 55-60%, normal wall motion, PASP 32 mmHg Echocardiogram (12/04/2013): Moderate LVH, EF 50-55%, normal wall motion. LHC (03/2007): Normal coronary arteries, EF 50-55% Hypertension  Hyperlipidemia Sleep apnea        Discussed the use of AI scribe software for clinical note transcription with the patient, who gave verbal consent to proceed.  History of Present Illness Jacqueline Petersen is a 68 y.o. female who returns for posthospitalization follow-up.  She was last seen by Dr. Alroy Aspen 11/2023.  She went to the emergency room 04/23/24 with symptomatic atrial fibrillation with rapid ventricular rate.  She was given IV metoprolol  with improved heart rates.  Cardioversion was recommended but she declined and opted to follow-up with cardiology.  She is here alone.  She experiences episodes of her heart 'beating fast' and feeling like her whole body is trembling. She feels weak, tired, and lacking strength, which is unusual for her as she is typically very active. She attempted to take metoprolol  during the day to manage her symptoms but found it exacerbated her heart rhythm issues, so she reverted to taking it at night. Despite this, she continues to experience episodes of rapid palpitations at night.  Coumadin  level today was therapeutic at 2.3.  She has not had chest heaviness or pressure, but she notes difficulty breathing when her heart races. Lying flat can make her heart feel like it's 'ticking more,' so  she uses pillows to elevate herself. No syncope, melena, or hematuria. She has a history of sleep apnea and has been using her CPAP machine more consistently since her recent trip to the emergency room. She has also made dietary changes, reducing salt intake and avoiding certain foods that previously caused swelling.   ROS-See HPI    Studies Reviewed:   EKG Interpretation Date/Time:  Wednesday Apr 30 2024 11:07:19 EDT Ventricular Rate:  87 PR Interval:    QRS Duration:  80 QT Interval:  366 QTC Calculation: 440 R Axis:   -31  Text Interpretation: Atrial fibrillation Left axis deviation Low voltage QRS Cannot rule out Anterior infarct Poor R wave progression Confirmed by Marlyse Single [id number]) on 04/30/2024 11:33:52 AM    Results LABS INR: 1.9 (03/28/2024) INR: 4.1 (04/23/2024)    Risk Assessment/Calculations:    CHA2DS2-VASc Score = 3   This indicates a 3.2% annual risk of stroke. The patient's score is based upon: CHF History: 0 HTN History: 1 Diabetes History: 0 Stroke History: 0 Vascular Disease History: 0 Age Score: 1 Gender Score: 1    HYPERTENSION CONTROL Vitals:   04/30/24 1102 04/30/24 1205  BP: 98/62 (!) 140/80    The patient's blood pressure is elevated above target today.  In order to address the patient's elevated BP: A current anti-hypertensive medication was adjusted today.          Physical Exam:   VS:  BP (!) 140/80   Pulse 87   Ht 5\' 3"  (1.6 m)   Wt 248  lb 12.8 oz (112.9 kg)   SpO2 98%   BMI 44.07 kg/m    Wt Readings from Last 3 Encounters:  04/30/24 248 lb 12.8 oz (112.9 kg)  04/23/24 259 lb 14.8 oz (117.9 kg)  04/23/24 260 lb (117.9 kg)    Constitutional:      Appearance: Healthy appearance. Not in distress.  Neck:     Vascular: JVD normal.  Pulmonary:     Breath sounds: Normal breath sounds. No wheezing. No rales.  Cardiovascular:     Normal rate. Irregularly irregular rhythm.     Murmurs: There is no murmur.  Edema:     Peripheral edema absent.  Abdominal:     Palpations: Abdomen is soft.        Assessment and Plan:   Assessment & Plan Paroxysmal atrial fibrillation [facility]) Patient recently presented to the emergency room with recurrent atrial fibrillation.  She was symptomatic.  Of note, chest x-ray did demonstrate vascular congestion.  She was given IV metoprolol  x 1 with improved heart rate.  She declined cardioversion emergency room and preferred to follow-up here.  Of note, she did have a subtherapeutic INR on 03/28/24 at 1.9.  INR on 04/04/2024 was 2.3, 04/23/2024 4.1, 04/30/2024: 2.3. She remains in atrial fibrillation.  Heart rate on EKG today is better controlled.  However, she reports episodes of palpitations and fatigue.  She notes that her insurance recently changed and will cover one of the DOAC's.  She would rather take once daily medication and prefers Xarelto .  Hopefully, her symptoms will improve on higher dose metoprolol .  If she is not tolerating atrial fibrillation, we can certainly see her back sooner and consider TEE guided cardioversion. - Increase metoprolol  succinate to 150 mg at night for better heart rate control. - I will discuss with her Coumadin  clinic to transition from Coumadin  to Xarelto  20 mg daily. - Follow-up 3 weeks.  Repeat EKG at that time.  Plan arranging cardioversion if she remains in atrial fibrillation. - Order echocardiogram to rule out structural heart disease Essential hypertension Blood pressure borderline elevated.  Increase metoprolol  succinate to 150 mg daily for better heart rate and blood pressure control.  Continue hydralazine  25 mg 3 times a day, HCTZ 25 mg daily.  If blood pressure and heart rate remain elevated we could add a low-dose of diltiazem . OSA (obstructive sleep apnea) She had not been wearing her CPAP but recently started back on it.  Continue CPAP.         Dispo:  Return in about 4 weeks (around 05/28/2024) for Routine Follow Up, w/ Marlyse Single,  PA-C.  Signed, Marlyse Single, PA-C

## 2024-04-30 ENCOUNTER — Telehealth: Payer: Self-pay | Admitting: *Deleted

## 2024-04-30 ENCOUNTER — Encounter: Payer: Self-pay | Admitting: Physician Assistant

## 2024-04-30 ENCOUNTER — Ambulatory Visit (INDEPENDENT_AMBULATORY_CARE_PROVIDER_SITE_OTHER): Admitting: Physician Assistant

## 2024-04-30 ENCOUNTER — Other Ambulatory Visit (HOSPITAL_COMMUNITY): Payer: Self-pay

## 2024-04-30 ENCOUNTER — Ambulatory Visit: Attending: Cardiovascular Disease | Admitting: *Deleted

## 2024-04-30 VITALS — BP 140/80 | HR 87 | Ht 63.0 in | Wt 248.8 lb

## 2024-04-30 DIAGNOSIS — I480 Paroxysmal atrial fibrillation: Secondary | ICD-10-CM | POA: Diagnosis not present

## 2024-04-30 DIAGNOSIS — I4891 Unspecified atrial fibrillation: Secondary | ICD-10-CM

## 2024-04-30 DIAGNOSIS — Z7901 Long term (current) use of anticoagulants: Secondary | ICD-10-CM

## 2024-04-30 DIAGNOSIS — G4733 Obstructive sleep apnea (adult) (pediatric): Secondary | ICD-10-CM

## 2024-04-30 DIAGNOSIS — I10 Essential (primary) hypertension: Secondary | ICD-10-CM | POA: Diagnosis not present

## 2024-04-30 LAB — POCT INR: INR: 2.3 (ref 2.0–3.0)

## 2024-04-30 MED ORDER — METOPROLOL SUCCINATE ER 100 MG PO TB24
150.0000 mg | ORAL_TABLET | Freq: Every day | ORAL | 3 refills | Status: AC
  Filled 2024-04-30 – 2024-09-22 (×3): qty 135, 90d supply, fill #0
  Filled 2024-12-16: qty 135, 90d supply, fill #1

## 2024-04-30 NOTE — Assessment & Plan Note (Signed)
 Blood pressure borderline elevated.  Increase metoprolol  succinate to 150 mg daily for better heart rate and blood pressure control.  Continue hydralazine  25 mg 3 times a day, HCTZ 25 mg daily.  If blood pressure and heart rate remain elevated we could add a low-dose of diltiazem .

## 2024-04-30 NOTE — Telephone Encounter (Signed)
 Called and spoke with pt after receiving a message that she was interested in starting xarelto  20mg  daily and that her insurance would cover it and that she will be pending a cardioversion as well.   Patient states that she changed insurance earlier in the year and since that time things have been screwed up with her medical plans. She states she think the price would be lower but was not sure. Asked if she had ever taken xarelto  and she stated she hasn't. Looked back at past meds and she had taken xarelto  in the past in year 2021 and had some blood in her urine and was switched to eliquis  twice a day (please see 08/13/20 note).   Advised pt that I would reach out to our Pharmacy Team to check the cost and contact her back. The pharmacy team states that the cost for one month of Xarelto  is $260. We have a 1 time free trial card but because the patient is Medicare there would not be any help with copay after the first month.   Returned call to the pt and advised of the above and she states that was too expensive and she will stay on the warfarin at this time. She was thankful for the update. Also, updated Thamas Fillers, CMA and [employer]. regarding this.  Pt is aware that she will need to continue weekly INR checks for the pending cardioversion and that the INR level will need to remain 2.0 or greater for the next 3 INR visits. Confirmed next INR appointment for 5/16 & place note on appointment.

## 2024-04-30 NOTE — Patient Instructions (Addendum)
 Medication Instructions:  Your physician has recommended you make the following change in your medication:   INCREASE the Toprol  to 100 taking 1 and 1/2 tablet daily  *If you need a refill on your cardiac medications before your next appointment, please call your pharmacy*  Lab Work: None ordered  If you have labs (blood work) drawn today and your tests are completely normal, you will receive your results only by: MyChart Message (if you have MyChart) OR A paper copy in the mail If you have any lab test that is abnormal or we need to change your treatment, we will call you to review the results.  Testing/Procedures: Your physician has requested that you have an echocardiogram. Echocardiography is a painless test that uses sound waves to create images of your heart. It provides your doctor with information about the size and shape of your heart and how well your heart's chambers and valves are working. This procedure takes approximately one hour. There are no restrictions for this procedure. Please do NOT wear cologne, perfume, aftershave, or lotions (deodorant is allowed). Please arrive 15 minutes prior to your appointment time.  Please note: We ask at that you not bring children with you during ultrasound (echo/ vascular) testing. Due to room size and safety concerns, children are not allowed in the ultrasound rooms during exams. Our front office staff cannot provide observation of children in our lobby area while testing is being conducted. An adult accompanying a patient to their appointment will only be allowed in the ultrasound room at the discretion of the ultrasound technician under special circumstances. We apologize for any inconvenience.   Follow-Up: At [facility], you and your health needs are our priority.  As part of our continuing mission to provide you with exceptional heart care, our providers are all part of one team.  This team includes your primary Cardiologist  (physician) and Advanced Practice Providers or APPs (Physician Assistants and Nurse Practitioners) who all work together to provide you with the care you need, when you need it.  Your next appointment:   Will call you  Provider:   Ahmad Alert, MD    We recommend signing up for the patient portal called "MyChart".  Sign up information is provided on this After Visit Summary.  MyChart is used to connect with patients for Virtual Visits (Telemedicine).  Patients are able to view lab/test results, encounter notes, upcoming appointments, etc.  Non-urgent messages can be sent to your provider as well.   To learn more about what you can do with MyChart, go to [email or web address].   Other Instructions

## 2024-04-30 NOTE — Assessment & Plan Note (Addendum)
 Patient recently presented to the emergency room with recurrent atrial fibrillation.  She was symptomatic.  Of note, chest x-ray did demonstrate vascular congestion.  She was given IV metoprolol  x 1 with improved heart rate.  She declined cardioversion emergency room and preferred to follow-up here.  Of note, she did have a subtherapeutic INR on 03/28/24 at 1.9.  INR on 04/04/2024 was 2.3, 04/23/2024 4.1, 04/30/2024: 2.3. She remains in atrial fibrillation.  Heart rate on EKG today is better controlled.  However, she reports episodes of palpitations and fatigue.  She notes that her insurance recently changed and will cover one of the DOAC's.  She would rather take once daily medication and prefers Xarelto .  Hopefully, her symptoms will improve on higher dose metoprolol .  If she is not tolerating atrial fibrillation, we can certainly see her back sooner and consider TEE guided cardioversion. - Increase metoprolol  succinate to 150 mg at night for better heart rate control. - I will discuss with her Coumadin  clinic to transition from Coumadin  to Xarelto  20 mg daily. - Follow-up 3 weeks.  Repeat EKG at that time.  Plan arranging cardioversion if she remains in atrial fibrillation. - Order echocardiogram to rule out structural heart disease

## 2024-04-30 NOTE — Patient Instructions (Signed)
 Description   Continue taking warfarin 1 tablet daily except 1/2 tablet on Mondays and Fridays.   Recheck in 1 week.  Stay consistent with greens each week Coumadin Clinic [phone]

## 2024-05-01 ENCOUNTER — Ambulatory Visit (HOSPITAL_COMMUNITY): Attending: Cardiology

## 2024-05-01 DIAGNOSIS — I361 Nonrheumatic tricuspid (valve) insufficiency: Secondary | ICD-10-CM

## 2024-05-01 DIAGNOSIS — I10 Essential (primary) hypertension: Secondary | ICD-10-CM | POA: Insufficient documentation

## 2024-05-01 DIAGNOSIS — I480 Paroxysmal atrial fibrillation: Secondary | ICD-10-CM | POA: Diagnosis not present

## 2024-05-01 LAB — ECHOCARDIOGRAM COMPLETE
Area-P 1/2: 4.1 cm2
MV M vel: 5.22 m/s
MV Peak grad: 109 mmHg
Radius: 0.4 cm
S' Lateral: 3.93 cm

## 2024-05-02 ENCOUNTER — Encounter: Payer: Self-pay | Admitting: Physician Assistant

## 2024-05-02 DIAGNOSIS — I429 Cardiomyopathy, unspecified: Secondary | ICD-10-CM | POA: Insufficient documentation

## 2024-05-02 HISTORY — DX: Cardiomyopathy, unspecified: I42.9

## 2024-05-06 ENCOUNTER — Ambulatory Visit (HOSPITAL_BASED_OUTPATIENT_CLINIC_OR_DEPARTMENT_OTHER): Payer: Self-pay | Admitting: *Deleted

## 2024-05-09 ENCOUNTER — Ambulatory Visit: Attending: Cardiovascular Disease

## 2024-05-09 DIAGNOSIS — Z7901 Long term (current) use of anticoagulants: Secondary | ICD-10-CM

## 2024-05-09 DIAGNOSIS — I4891 Unspecified atrial fibrillation: Secondary | ICD-10-CM | POA: Diagnosis not present

## 2024-05-09 LAB — POCT INR: INR: 3 (ref 2.0–3.0)

## 2024-05-09 NOTE — Patient Instructions (Signed)
 Continue taking warfarin 1 tablet daily except 1/2 tablet on Mondays and Fridays.   Recheck in 1 week. DCCV TBD Stay consistent with greens each week Coumadin  Clinic [phone]

## 2024-05-12 ENCOUNTER — Other Ambulatory Visit (HOSPITAL_COMMUNITY): Payer: Self-pay

## 2024-05-12 ENCOUNTER — Other Ambulatory Visit: Payer: Self-pay

## 2024-05-15 NOTE — Progress Notes (Signed)
 Cardiology Office Note:    Date:  05/16/2024  ID:  Enrico Hartshorn, DOB 10-04-1956, MRN [id number] PCP: Sun, Vyvyan, MD  [facility] Providers Cardiologist:  Ahmad Alert, MD     Patient Profile:     Paroxysmal atrial fibrillation  ?tachy mediated CM >> TTE 05/01/24: EF 45-50, global HK, mild LVH, NL RVSF, NL PASP, RVSP 35, mod LAE, mild MR, RAP 8 PVCs  11% per Holter in 2016 Eval by EP (Dr. Rodolfo Clan) >> EF normal, SAECG normal >> improved with reduced caffeine  Echocardiogram 01/10/16: EF 55-60, mod LAE SAECG 2/16: Normal  Holter 2/16: NSR, Freq PVCs (11%) Echo (12/10/14):  EF 55-60%, normal wall motion, PASP 32 mmHg Echocardiogram (12/04/2013): Moderate LVH, EF 50-55%, normal wall motion. LHC (03/2007): Normal coronary arteries, EF 50-55% Hypertension  Hyperlipidemia Sleep apnea       Discussed the use of AI scribe software for clinical note transcription with the patient, who gave verbal consent to proceed.  History of Present Illness Jacqueline Petersen is a 68 y.o. female returns for follow-up of atrial fibrillation.  She was last seen 04/30/2024 for posthospitalization follow-up.  She went to the emergency room 04/23/2024 with symptomatic atrial fibrillation with rapid rate.  She had a subtherapeutic INR on 03/28/2024 at 1.9.  I adjusted her metoprolol  succinate to 150 mg daily.  We tried to transition her to Xarelto  but the cost was too great.  She has been monitored weekly by our Coumadin  clinic over the last several weeks to help guide us  toward cardioversion.  Her INR has been above 2 since 04/23/2024.  Of note, follow-up echocardiogram demonstrated mildly reduced LV function with an EF of 45-50%.  Question if this may be related to tachycardia. 04/23/2024: INR 4.1 04/30/2024: INR 2.3 05/09/2024: INR 3  She feels better overall since her last visit but continues to experience episodes of shortness of breath, particularly when walking. She describes a sensation  of 'something coming from the stomach and going up to the throat,' which 'cuts her breath off.' This occurs when she walks, especially uphill, and improves with rest.  She notes that this feels like her heart is beating faster.  No chest pain, pressure, heaviness, or tightness. After taking the metoprolol , her heart feels like it is racing.  She uses a CPAP machine nightly for sleep apnea    ROS-See HPI    Studies Reviewed:  EKG Interpretation Date/Time:  Friday May 16 2024 08:51:12 EDT Ventricular Rate:  82 PR Interval:    QRS Duration:  80 QT Interval:  378 QTC Calculation: 441 R Axis:   -30  Text Interpretation: Atrial fibrillation Left axis deviation Low voltage QRS Confirmed by Marlyse Single [id number]) on 05/16/2024 9:02:51 AM    Results  Risk Assessment/Calculations:  CHA2DS2-VASc Score = 3   This indicates a 3.2% annual risk of stroke. The patient's score is based upon: CHF History: 0 HTN History: 1 Diabetes History: 0 Stroke History: 0 Vascular Disease History: 0 Age Score: 1 Gender Score: 1           Physical Exam:  VS:  BP 130/70   Pulse 82   Ht 5\' 3"  (1.6 m)   Wt 245 lb 12.8 oz (111.5 kg)   SpO2 97%   BMI 43.54 kg/m    Wt Readings from Last 3 Encounters:  05/16/24 245 lb 12.8 oz (111.5 kg)  04/30/24 248 lb 12.8 oz (112.9 kg)  04/23/24 259 lb 14.8 oz (117.9 kg)  Constitutional:      Appearance: Healthy appearance. Not in distress.  Neck:     Vascular: JVD normal.  Pulmonary:     Breath sounds: Normal breath sounds. No wheezing. No rales.  Cardiovascular:     Normal rate. Irregularly irregular rhythm.     Murmurs: There is no murmur.  Edema:    Peripheral edema absent.  Abdominal:     Palpations: Abdomen is soft.        Assessment and Plan: Assessment & Plan Persistent atrial fibrillation (HCC) She remains in persistent atrial fibrillation and is symptomatic, though symptoms have improved since the last visit. I have recommended proceeding  with DCCV. I d/w attending MD today (Dr. Berry Bristol) who agreed. Her INR has been therapeutic since 04/23/24.  If INR is above 2 today, we will proceed with cardioversion next week. - Proceed with cardioversion next week if INR is above 2 today (INR today was 2.5) - Follow up with the AFib clinic one week after cardioversion. - Check INR weekly for four weeks post-cardioversion to maintain therapeutic levels. - Continue Coumadin  therapy. - Follow up with Coumadin  clinic. - Follow-up with me or Dr. Alroy Aspen in 6 weeks Other cardiomyopathy [facility]) The echocardiogram showed an ejection fraction of 45-50%, likely related to tachycardia from atrial fibrillation.  - Recheck echocardiogram after restoration of sinus rhythm. Essential hypertension Blood pressure is controlled with current medication regimen. - Continue hydralazine  25 mg three times a day. - Continue HCTZ 25 mg daily. - Continue metoprolol  succinate 150 mg daily. OSA (obstructive sleep apnea) She uses CPAP nightly      Informed Consent   Shared Decision Making/Informed Consent The risks (stroke, cardiac arrhythmias rarely resulting in the need for a temporary or permanent pacemaker, skin irritation or burns and complications associated with conscious sedation including aspiration, arrhythmia, respiratory failure and death), benefits (restoration of normal sinus rhythm) and alternatives of a direct current cardioversion were explained in detail to [name] and she agrees to proceed.       Dispo:  Return in about 6 weeks (around 06/27/2024) for Routine Follow Up, w/ Dr. Alroy Aspen, or Marlyse Single, PA-C. Signed, Marlyse Single, PA-C

## 2024-05-15 NOTE — H&P (View-Only) (Signed)
 Cardiology Office Note:    Date:  05/16/2024  ID:  Jacqueline Petersen, DOB 10-04-1956, MRN [id number] PCP: Sun, Vyvyan, MD  [facility] Providers Cardiologist:  Ahmad Alert, MD     Patient Profile:     Paroxysmal atrial fibrillation  ?tachy mediated CM >> TTE 05/01/24: EF 45-50, global HK, mild LVH, NL RVSF, NL PASP, RVSP 35, mod LAE, mild MR, RAP 8 PVCs  11% per Holter in 2016 Eval by EP (Dr. Rodolfo Clan) >> EF normal, SAECG normal >> improved with reduced caffeine  Echocardiogram 01/10/16: EF 55-60, mod LAE SAECG 2/16: Normal  Holter 2/16: NSR, Freq PVCs (11%) Echo (12/10/14):  EF 55-60%, normal wall motion, PASP 32 mmHg Echocardiogram (12/04/2013): Moderate LVH, EF 50-55%, normal wall motion. LHC (03/2007): Normal coronary arteries, EF 50-55% Hypertension  Hyperlipidemia Sleep apnea       Discussed the use of AI scribe software for clinical note transcription with the patient, who gave verbal consent to proceed.  History of Present Illness Jacqueline Petersen is a 68 y.o. female returns for follow-up of atrial fibrillation.  She was last seen 04/30/2024 for posthospitalization follow-up.  She went to the emergency room 04/23/2024 with symptomatic atrial fibrillation with rapid rate.  She had a subtherapeutic INR on 03/28/2024 at 1.9.  I adjusted her metoprolol  succinate to 150 mg daily.  We tried to transition her to Xarelto  but the cost was too great.  She has been monitored weekly by our Coumadin  clinic over the last several weeks to help guide us  toward cardioversion.  Her INR has been above 2 since 04/23/2024.  Of note, follow-up echocardiogram demonstrated mildly reduced LV function with an EF of 45-50%.  Question if this may be related to tachycardia. 04/23/2024: INR 4.1 04/30/2024: INR 2.3 05/09/2024: INR 3  She feels better overall since her last visit but continues to experience episodes of shortness of breath, particularly when walking. She describes a sensation  of 'something coming from the stomach and going up to the throat,' which 'cuts her breath off.' This occurs when she walks, especially uphill, and improves with rest.  She notes that this feels like her heart is beating faster.  No chest pain, pressure, heaviness, or tightness. After taking the metoprolol , her heart feels like it is racing.  She uses a CPAP machine nightly for sleep apnea    ROS-See HPI    Studies Reviewed:  EKG Interpretation Date/Time:  Friday May 16 2024 08:51:12 EDT Ventricular Rate:  82 PR Interval:    QRS Duration:  80 QT Interval:  378 QTC Calculation: 441 R Axis:   -30  Text Interpretation: Atrial fibrillation Left axis deviation Low voltage QRS Confirmed by Marlyse Single [id number]) on 05/16/2024 9:02:51 AM    Results  Risk Assessment/Calculations:  CHA2DS2-VASc Score = 3   This indicates a 3.2% annual risk of stroke. The patient's score is based upon: CHF History: 0 HTN History: 1 Diabetes History: 0 Stroke History: 0 Vascular Disease History: 0 Age Score: 1 Gender Score: 1           Physical Exam:  VS:  BP 130/70   Pulse 82   Ht 5\' 3"  (1.6 m)   Wt 245 lb 12.8 oz (111.5 kg)   SpO2 97%   BMI 43.54 kg/m    Wt Readings from Last 3 Encounters:  05/16/24 245 lb 12.8 oz (111.5 kg)  04/30/24 248 lb 12.8 oz (112.9 kg)  04/23/24 259 lb 14.8 oz (117.9 kg)  Constitutional:      Appearance: Healthy appearance. Not in distress.  Neck:     Vascular: JVD normal.  Pulmonary:     Breath sounds: Normal breath sounds. No wheezing. No rales.  Cardiovascular:     Normal rate. Irregularly irregular rhythm.     Murmurs: There is no murmur.  Edema:    Peripheral edema absent.  Abdominal:     Palpations: Abdomen is soft.        Assessment and Plan: Assessment & Plan Persistent atrial fibrillation (HCC) She remains in persistent atrial fibrillation and is symptomatic, though symptoms have improved since the last visit. I have recommended proceeding  with DCCV. I d/w attending MD today (Dr. Berry Bristol) who agreed. Her INR has been therapeutic since 04/23/24.  If INR is above 2 today, we will proceed with cardioversion next week. - Proceed with cardioversion next week if INR is above 2 today (INR today was 2.5) - Follow up with the AFib clinic one week after cardioversion. - Check INR weekly for four weeks post-cardioversion to maintain therapeutic levels. - Continue Coumadin  therapy. - Follow up with Coumadin  clinic. - Follow-up with me or Dr. Alroy Aspen in 6 weeks Other cardiomyopathy [facility]) The echocardiogram showed an ejection fraction of 45-50%, likely related to tachycardia from atrial fibrillation.  - Recheck echocardiogram after restoration of sinus rhythm. Essential hypertension Blood pressure is controlled with current medication regimen. - Continue hydralazine  25 mg three times a day. - Continue HCTZ 25 mg daily. - Continue metoprolol  succinate 150 mg daily. OSA (obstructive sleep apnea) She uses CPAP nightly      Informed Consent   Shared Decision Making/Informed Consent The risks (stroke, cardiac arrhythmias rarely resulting in the need for a temporary or permanent pacemaker, skin irritation or burns and complications associated with conscious sedation including aspiration, arrhythmia, respiratory failure and death), benefits (restoration of normal sinus rhythm) and alternatives of a direct current cardioversion were explained in detail to Jacqueline Petersen and she agrees to proceed.       Dispo:  Return in about 6 weeks (around 06/27/2024) for Routine Follow Up, w/ Dr. Alroy Aspen, or Marlyse Single, PA-C. Signed, Marlyse Single, PA-C

## 2024-05-16 ENCOUNTER — Ambulatory Visit: Attending: Physician Assistant | Admitting: Physician Assistant

## 2024-05-16 ENCOUNTER — Encounter: Payer: Self-pay | Admitting: *Deleted

## 2024-05-16 ENCOUNTER — Encounter: Payer: Self-pay | Admitting: Physician Assistant

## 2024-05-16 ENCOUNTER — Ambulatory Visit (INDEPENDENT_AMBULATORY_CARE_PROVIDER_SITE_OTHER): Admitting: *Deleted

## 2024-05-16 VITALS — BP 130/70 | HR 82 | Ht 63.0 in | Wt 245.8 lb

## 2024-05-16 DIAGNOSIS — I10 Essential (primary) hypertension: Secondary | ICD-10-CM

## 2024-05-16 DIAGNOSIS — G4733 Obstructive sleep apnea (adult) (pediatric): Secondary | ICD-10-CM | POA: Diagnosis not present

## 2024-05-16 DIAGNOSIS — I428 Other cardiomyopathies: Secondary | ICD-10-CM

## 2024-05-16 DIAGNOSIS — I4819 Other persistent atrial fibrillation: Secondary | ICD-10-CM | POA: Diagnosis not present

## 2024-05-16 DIAGNOSIS — I4891 Unspecified atrial fibrillation: Secondary | ICD-10-CM

## 2024-05-16 DIAGNOSIS — Z7901 Long term (current) use of anticoagulants: Secondary | ICD-10-CM

## 2024-05-16 LAB — POCT INR: INR: 2.5 (ref 2.0–3.0)

## 2024-05-16 NOTE — Assessment & Plan Note (Signed)
 The echocardiogram showed an ejection fraction of 45-50%, likely related to tachycardia from atrial fibrillation.  - Recheck echocardiogram after restoration of sinus rhythm.

## 2024-05-16 NOTE — Assessment & Plan Note (Addendum)
 She remains in persistent atrial fibrillation and is symptomatic, though symptoms have improved since the last visit. I have recommended proceeding with DCCV. I d/w attending MD today (Dr. Berry Bristol) who agreed. Her INR has been therapeutic since 04/23/24.  If INR is above 2 today, we will proceed with cardioversion next week. - Proceed with cardioversion next week if INR is above 2 today (INR today was 2.5) - Follow up with the AFib clinic one week after cardioversion. - Check INR weekly for four weeks post-cardioversion to maintain therapeutic levels. - Continue Coumadin  therapy. - Follow up with Coumadin  clinic. - Follow-up with me or Dr. Alroy Aspen in 6 weeks

## 2024-05-16 NOTE — Assessment & Plan Note (Signed)
 Blood pressure is controlled with current medication regimen. - Continue hydralazine  25 mg three times a day. - Continue HCTZ 25 mg daily. - Continue metoprolol  succinate 150 mg daily.

## 2024-05-16 NOTE — Patient Instructions (Addendum)
 Medication Instructions:  Your physician recommends that you continue on your current medications as directed. Please refer to the Current Medication list given to you today.  *If you need a refill on your cardiac medications before your next appointment, please call your pharmacy*  Lab Work: INR needs to be 2 or higher - needs weekly INR for 4 weeks after DCCV If you have labs (blood work) drawn today and your tests are completely normal, you will receive your results only by: MyChart Message (if you have MyChart) OR A paper copy in the mail If you have any lab test that is abnormal or we need to change your treatment, we will call you to review the results.  Testing/Procedures: Your physician has recommended that you have a Cardioversion (DCCV). Electrical Cardioversion uses a jolt of electricity to your heart either through paddles or wired patches attached to your chest. This is a controlled, usually prescheduled, procedure. Defibrillation is done under light anesthesia in the hospital, and you usually go home the day of the procedure. This is done to get your heart back into a normal rhythm. You are not awake for the procedure. Please see the instruction sheet given to you today.    Follow-Up: At [facility], you and your health needs are our priority.  As part of our continuing mission to provide you with exceptional heart care, our providers are all part of one team.  This team includes your primary Cardiologist (physician) and Advanced Practice Providers or APPs (Physician Assistants and Nurse Practitioners) who all work together to provide you with the care you need, when you need it.  Your next appointment:   4-6 week(s)  Provider:   Ahmad Alert, MD or Marlyse Single, PA-C  Your physician recommends that you schedule a follow-up appointment with Afib clinic 1 week after DCCV  We recommend signing up for the patient portal called "MyChart".  Sign up information is  provided on this After Visit Summary.  MyChart is used to connect with patients for Virtual Visits (Telemedicine).  Patients are able to view lab/test results, encounter notes, upcoming appointments, etc.  Non-urgent messages can be sent to your provider as well.   To learn more about what you can do with MyChart, go to [email or web address].   Other Instructions

## 2024-05-16 NOTE — Patient Instructions (Signed)
 Description   Continue taking warfarin 1 tablet daily except 1/2 tablet on Mondays and Fridays.   Recheck in 1 week. DCCV 05/20/24 Stay consistent with greens each week Coumadin  Clinic [phone]

## 2024-05-16 NOTE — Progress Notes (Signed)
 Called patient with pre-procedure instructions for Tuesday May 20, 2024.   Patient informed of:   Time to arrive for procedure 1245 Remain NPO past midnight.  Must have a ride home and a responsible adult to remain with them for 24 hours post procedure.  Confirmed blood thinner. Coumadin  Confirmed no breaks in taking blood thinner for 3+ weeks prior to procedure.

## 2024-05-20 ENCOUNTER — Encounter (HOSPITAL_COMMUNITY)
Admission: RE | Disposition: A | Discharge status: Home / Self Care | Payer: Self-pay | Source: Home / Self Care | Attending: Cardiology

## 2024-05-20 ENCOUNTER — Other Ambulatory Visit: Payer: Self-pay

## 2024-05-20 ENCOUNTER — Ambulatory Visit (HOSPITAL_COMMUNITY)
Admission: RE | Admit: 2024-05-20 | Discharge: 2024-05-20 | Disposition: A | Discharge status: Home / Self Care | Attending: Cardiology | Admitting: Cardiology

## 2024-05-20 ENCOUNTER — Ambulatory Visit (HOSPITAL_COMMUNITY): Admitting: Anesthesiology

## 2024-05-20 DIAGNOSIS — E785 Hyperlipidemia, unspecified: Secondary | ICD-10-CM

## 2024-05-20 DIAGNOSIS — K219 Gastro-esophageal reflux disease without esophagitis: Secondary | ICD-10-CM | POA: Diagnosis not present

## 2024-05-20 DIAGNOSIS — Z7901 Long term (current) use of anticoagulants: Secondary | ICD-10-CM | POA: Insufficient documentation

## 2024-05-20 DIAGNOSIS — I4891 Unspecified atrial fibrillation: Secondary | ICD-10-CM | POA: Diagnosis not present

## 2024-05-20 DIAGNOSIS — G4733 Obstructive sleep apnea (adult) (pediatric): Secondary | ICD-10-CM | POA: Diagnosis not present

## 2024-05-20 DIAGNOSIS — I4819 Other persistent atrial fibrillation: Secondary | ICD-10-CM | POA: Insufficient documentation

## 2024-05-20 DIAGNOSIS — Z79899 Other long term (current) drug therapy: Secondary | ICD-10-CM | POA: Diagnosis not present

## 2024-05-20 DIAGNOSIS — I10 Essential (primary) hypertension: Secondary | ICD-10-CM | POA: Diagnosis not present

## 2024-05-20 DIAGNOSIS — I428 Other cardiomyopathies: Secondary | ICD-10-CM | POA: Diagnosis not present

## 2024-05-20 HISTORY — PX: CARDIOVERSION: EP1203

## 2024-05-20 LAB — POCT I-STAT, CHEM 8
BUN: 15 mg/dL (ref 8–23)
Calcium, Ion: 1.24 mmol/L (ref 1.15–1.40)
Chloride: 100 mmol/L (ref 98–111)
Creatinine, Ser: 0.8 mg/dL (ref 0.44–1.00)
Glucose, Bld: 101 mg/dL — ABNORMAL HIGH (ref 70–99)
HCT: 41 % (ref 36.0–46.0)
Hemoglobin: 13.9 g/dL (ref 12.0–15.0)
Potassium: 3.4 mmol/L — ABNORMAL LOW (ref 3.5–5.1)
Sodium: 141 mmol/L (ref 135–145)
TCO2: 29 mmol/L (ref 22–32)

## 2024-05-20 LAB — PROTIME-INR
INR: 2.3 — ABNORMAL HIGH (ref 0.8–1.2)
Prothrombin Time: 25.8 s — ABNORMAL HIGH (ref 11.4–15.2)

## 2024-05-20 SURGERY — CARDIOVERSION (CATH LAB)
Anesthesia: General

## 2024-05-20 MED ORDER — SODIUM CHLORIDE 0.9% FLUSH: 3.0000 mL | Freq: Two times a day (BID) | INTRAVENOUS | Status: DC

## 2024-05-20 MED ORDER — POTASSIUM CHLORIDE CRYS ER 20 MEQ PO TBCR
40.0000 meq | EXTENDED_RELEASE_TABLET | Freq: Once | ORAL | Status: AC
  Administered 2024-05-20: 40 meq via ORAL

## 2024-05-20 MED ORDER — PROPOFOL 10 MG/ML IV BOLUS
INTRAVENOUS | Status: DC | PRN
  Administered 2024-05-20: 50 mg via INTRAVENOUS
  Administered 2024-05-20: 20 mg via INTRAVENOUS

## 2024-05-20 MED ORDER — LIDOCAINE 2% (20 MG/ML) 5 ML SYRINGE
INTRAMUSCULAR | Status: DC | PRN
  Administered 2024-05-20: 40 mg via INTRAVENOUS

## 2024-05-20 MED ORDER — POTASSIUM CHLORIDE CRYS ER 20 MEQ PO TBCR
EXTENDED_RELEASE_TABLET | ORAL | Status: AC
  Filled 2024-05-20: qty 2

## 2024-05-20 MED ORDER — SODIUM CHLORIDE 0.9% FLUSH: 3.0000 mL | INTRAVENOUS | Status: DC | PRN

## 2024-05-20 SURGICAL SUPPLY — 1 items: PAD DEFIB RADIO PHYSIO CONN (PAD) ×1 IMPLANT

## 2024-05-20 NOTE — Interval H&P Note (Signed)
 History and Physical Interval Note:  05/20/2024 7:12 PM  Jacqueline Petersen  has presented today for surgery, with the diagnosis of AFIB.  The various methods of treatment have been discussed with the patient and family. After consideration of risks, benefits and other options for treatment, the patient has consented to  Procedure(s): CARDIOVERSION (N/A) as a surgical intervention.  The patient's history has been reviewed, patient examined, no change in status, stable for surgery.  I have reviewed the patient's chart and labs.  Questions were answered to the patient's satisfaction.    iSTAT reviewed.  INR in range  Contact person: Doretha Ganja.   Informed Consent   Shared Decision Making/Informed Consent The risks (stroke, cardiac arrhythmias rarely resulting in the need for a temporary or permanent pacemaker, skin irritation or burns and complications associated with conscious sedation including aspiration, arrhythmia, respiratory failure and death), benefits (restoration of normal sinus rhythm) and alternatives of a direct current cardioversion were explained in detail to [name] and she agrees to proceed.       Awilda Bogus, [facility]  A Division of [facility] [address], [place], [place] [address]  [name], [place] [address]

## 2024-05-20 NOTE — Transfer of Care (Signed)
 Immediate Anesthesia Transfer of Care Note  Patient: Jacqueline Petersen  Procedure(s) Performed: CARDIOVERSION  Patient Location: Cath Lab  Anesthesia Type:General  Level of Consciousness: drowsy  Airway & Oxygen Therapy: Patient Spontanous Breathing and Patient connected to nasal cannula oxygen  Post-op Assessment: Report given to RN and Post -op Vital signs reviewed and stable  Post vital signs: Reviewed and stable  Last Vitals:  Vitals Value Taken Time  BP    Temp    Pulse 101 05/20/24 1313  Resp 20 05/20/24 1313  SpO2 98 % 05/20/24 1313  Vitals shown include unfiled device data.  Last Pain:  Vitals:   05/20/24 1318  TempSrc:   PainSc: 0-No pain         Complications: No notable events documented.

## 2024-05-20 NOTE — Anesthesia Postprocedure Evaluation (Signed)
 Anesthesia Post Note  Patient: Jacqueline Petersen  Procedure(s) Performed: CARDIOVERSION     Patient location during evaluation: PACU Anesthesia Type: General Level of consciousness: awake and alert Pain management: pain level controlled Vital Signs Assessment: post-procedure vital signs reviewed and stable Respiratory status: spontaneous breathing, nonlabored ventilation, respiratory function stable and patient connected to nasal cannula oxygen Cardiovascular status: blood pressure returned to baseline and stable Postop Assessment: no apparent nausea or vomiting Anesthetic complications: no  No notable events documented.  Last Vitals:  Vitals:   05/20/24 1350 05/20/24 1400  BP: (!) 160/82 (!) 158/78  Pulse: 64 68  Resp: 16 (!) 21  Temp:    SpO2: 98% 99%    Last Pain:  Vitals:   05/20/24 1318  TempSrc:   PainSc: 0-No pain                 Willian Harrow

## 2024-05-20 NOTE — Anesthesia Preprocedure Evaluation (Addendum)
 Anesthesia Evaluation  Patient identified by MRN, date of birth, ID band Patient awake    Reviewed: Allergy & Precautions, Patient's Chart, lab work & pertinent test results  Airway Mallampati: II  TM Distance: >3 FB Neck ROM: Full    Dental  (+) Upper Dentures, Dental Advisory Given   Pulmonary sleep apnea    breath sounds clear to auscultation       Cardiovascular hypertension, + dysrhythmias Atrial Fibrillation  Rhythm:Regular Rate:Normal  Echo:   1. Left ventricular ejection fraction, by estimation, is 45 to 50%. The  left ventricle has mildly decreased function. The left ventricle  demonstrates global hypokinesis. The left ventricular internal cavity size  was mildly dilated. There is mild left  ventricular hypertrophy. Left ventricular diastolic parameters are  indeterminate.   2. Right ventricular systolic function is normal. The right ventricular  size is normal. There is normal pulmonary artery systolic pressure. The  estimated right ventricular systolic pressure is 35.0 mmHg.   3. Left atrial size was moderately dilated.   4. The mitral valve is normal in structure. Mild mitral valve  regurgitation.   5. The aortic valve is tricuspid. Aortic valve regurgitation is not  visualized. No aortic stenosis is present.   6. The inferior vena cava is normal in size with <50% respiratory  variability, suggesting right atrial pressure of 8 mmHg.     Neuro/Psych negative neurological ROS  negative psych ROS   GI/Hepatic Neg liver ROS,GERD  ,,  Endo/Other  negative endocrine ROS    Renal/GU negative Renal ROS     Musculoskeletal  (+) Arthritis ,    Abdominal   Peds  Hematology  (+) Blood dyscrasia, anemia   Anesthesia Other Findings   Reproductive/Obstetrics                             Anesthesia Physical Anesthesia Plan  ASA: 3  Anesthesia Plan: General   Post-op Pain  Management: Minimal or no pain anticipated   Induction: Intravenous  PONV Risk Score and Plan: 0  Airway Management Planned: Natural Airway and Nasal Cannula  Additional Equipment: None  Intra-op Plan:   Post-operative Plan:   Informed Consent: I have reviewed the patients History and Physical, chart, labs and discussed the procedure including the risks, benefits and alternatives for the proposed anesthesia with the patient or authorized representative who has indicated his/her understanding and acceptance.       Plan Discussed with: CRNA  Anesthesia Plan Comments:        Anesthesia Quick Evaluation

## 2024-05-20 NOTE — CV Procedure (Addendum)
   DIRECT CURRENT CARDIOVERSION  NAME:  Jacqueline Petersen    MRN: [id number] DOB:  October 16, 1956    ADMIT DATE: 05/20/2024  Indication:  Symptomatic atrial fibrillation  Procedure Note:  The patient signed informed consent.  They have had had therapeutic anticoagulation with Coumadin  greater than 3 weeks.  Anesthesia was administered by Dr. Otis Blocker.  Adequate airway was maintained throughout and vital followed per protocol.  They were cardioverted x 2 with 200J of biphasic synchronized energy.  They converted to NSR. Post cardioversion ECG noted SR w/ PVC is bigeminy pattern. There were no apparent complications.  The patient had normal neuro status and respiratory status post procedure with vitals stable as recorded elsewhere.    Follow up:  They will continue on current medical therapy and follow up with cardiology as scheduled. Will give Kdur 40mEq po once Spoke to her son Jacqueline Petersen at [phone]. Recommended that she goes to her week INR check to make sure her INR is at goal and follow up w/ PCP in 1 week regarding hypokalemia (potassium level).   Awilda Bogus, [facility]  A Division of  [facility] [address] [place], [place] [address]  [place], [place] [address] 1:38 PM

## 2024-05-21 ENCOUNTER — Encounter (HOSPITAL_COMMUNITY): Payer: Self-pay | Admitting: Cardiology

## 2024-05-27 ENCOUNTER — Other Ambulatory Visit (HOSPITAL_COMMUNITY): Payer: Self-pay

## 2024-05-28 ENCOUNTER — Encounter

## 2024-05-30 ENCOUNTER — Ambulatory Visit: Admitting: *Deleted

## 2024-05-30 ENCOUNTER — Ambulatory Visit (HOSPITAL_COMMUNITY)
Admission: RE | Admit: 2024-05-30 | Discharge: 2024-05-30 | Disposition: A | Discharge status: Home / Self Care | Source: Ambulatory Visit | Attending: Internal Medicine | Admitting: Internal Medicine

## 2024-05-30 VITALS — BP 150/70 | HR 62 | Ht 63.0 in | Wt 242.4 lb

## 2024-05-30 DIAGNOSIS — Z7901 Long term (current) use of anticoagulants: Secondary | ICD-10-CM

## 2024-05-30 DIAGNOSIS — I4891 Unspecified atrial fibrillation: Secondary | ICD-10-CM | POA: Diagnosis not present

## 2024-05-30 DIAGNOSIS — I4819 Other persistent atrial fibrillation: Secondary | ICD-10-CM

## 2024-05-30 DIAGNOSIS — D6869 Other thrombophilia: Secondary | ICD-10-CM | POA: Diagnosis not present

## 2024-05-30 LAB — POCT INR: INR: 2.2 (ref 2.0–3.0)

## 2024-05-30 NOTE — Progress Notes (Signed)
 Primary Care Physician: Sun, Vyvyan, MD Primary Cardiologist: Ahmad Alert, MD Electrophysiologist: None     Referring Physician: Marlyse Single, PA-C     Jacqueline Petersen is a 68 y.o. female with a history of cardiomyopathy, systolic LV dysfunction, PVCs, HTN, OSA on CPAP, and atrial fibrillation who presents for consultation in the [facility]. Seen by Cardiology on 5/7 and 5/23 in Afib on coumadin . S/p successful DCCV on 5/27. Patient is on coumadin  for a CHADS2VASC score of 4.  On evaluation today, she is currently in NSR. Patient notes the experience of cardioversion was scary but happy to be back in normal rhythm. She is on coumadin .  Today, she denies symptoms of palpitations, chest pain, shortness of breath, orthopnea, PND, lower extremity edema, dizziness, presyncope, syncope, snoring, daytime somnolence, bleeding, or neurologic sequela. The patient is tolerating medications without difficulties and is otherwise without complaint today.    Atrial Fibrillation Risk Factors:  she does have symptoms or diagnosis of sleep apnea. she is compliant with CPAP therapy.  she has a BMI of Body mass index is 42.94 kg/m.[name]   05/30/24 0912  Weight: 110 kg    Current Outpatient Medications  Medication Sig Dispense Refill   acetaminophen  (TYLENOL ) 650 MG CR tablet Take 650 mg by mouth 2 (two) times daily.     Cholecalciferol (VITAMIN D) 2000 UNITS CAPS Take 2,000 Units by mouth every evening.     CINNAMON PO Take 2 capsules by mouth every morning.     ferrous sulfate  325 (65 FE) MG EC tablet Take 1 tablet by mouth once a day 90 tablet 3   hydrALAZINE  (APRESOLINE ) 25 MG tablet Take 1 tablet (25 mg total) by mouth 3 (three) times daily. 270 tablet 3   hydrochlorothiazide  (HYDRODIURIL ) 25 MG tablet Take 1 tablet (25 mg total) by mouth daily. 90 tablet 3   metoprolol  succinate (TOPROL -XL) 100 MG 24 hr tablet Take 1.5 tablets (150 mg  total) by mouth daily. Take with or immediately following a meal. 135 tablet 3   Multiple Vitamins-Minerals (WOMENS 50+ MULTI VITAMIN) TABS Take 1 tablet by mouth daily.     Potassium Chloride  ER 20 MEQ TBCR Take 1 tablet (20 mEq total) by mouth daily with food 90 tablet 3   rosuvastatin  (CRESTOR ) 10 MG tablet Take 1 tablet (10 mg total) by mouth every evening. 90 tablet 3   thiamine (VITAMIN B-1) 100 MG tablet Take 100 mg by mouth daily.     warfarin (COUMADIN ) 5 MG tablet TAKE 1/2 TABLET TO 1 TABLET BY MOUTH DAILY AS DIRECTED BY COUMADIN  CLINIC (Patient taking differently: Take 2.5-5 mg by mouth See admin instructions. Take 2.5 mg on Monday and Friday, 5 mg all the other days  AS DIRECTED BY COUMADIN  CLINIC) 90 tablet 0   No current facility-administered medications for this encounter.    Atrial Fibrillation Management history:  Previous antiarrhythmic drugs: none Previous cardioversions: 05/20/24 Previous ablations: none Anticoagulation history: coumadin    ROS- All systems are reviewed and negative except as per the HPI above.  Physical Exam: BP (!) 150/70   Pulse 62   Ht 5\' 3"  (1.6 m)   Wt 110 kg   BMI 42.94 kg/m   GEN: Well nourished, well developed in no acute distress NECK: No JVD; No carotid bruits CARDIAC: Regular rate and rhythm, no murmurs, rubs, gallops RESPIRATORY:  Clear to auscultation without rales, wheezing or rhonchi  ABDOMEN: Soft, non-tender, non-distended EXTREMITIES:  No  edema; No deformity   EKG today demonstrates  Vent. rate 62 BPM PR interval 152 ms QRS duration 82 ms QT/QTcB 426/432 ms P-R-T axes 52 -29 -16 Normal sinus rhythm Low voltage QRS Cannot rule out Anterior infarct , age undetermined Abnormal ECG When compared with ECG of 20-May-2024 13:29, Premature ventricular complexes are no longer Present Nonspecific T wave abnormality now evident in Anterolateral leads  Echo 05/01/24 demonstrated  1. Left ventricular ejection fraction, by  estimation, is 45 to 50%. The  left ventricle has mildly decreased function. The left ventricle  demonstrates global hypokinesis. The left ventricular internal cavity size  was mildly dilated. There is mild left  ventricular hypertrophy. Left ventricular diastolic parameters are  indeterminate.   2. Right ventricular systolic function is normal. The right ventricular  size is normal. There is normal pulmonary artery systolic pressure. The  estimated right ventricular systolic pressure is 35.0 mmHg.   3. Left atrial size was moderately dilated.   4. The mitral valve is normal in structure. Mild mitral valve  regurgitation.   5. The aortic valve is tricuspid. Aortic valve regurgitation is not  visualized. No aortic stenosis is present.   6. The inferior vena cava is normal in size with <50% respiratory  variability, suggesting right atrial pressure of 8 mmHg.    ASSESSMENT & PLAN CHA2DS2-VASc Score = 3  The patient's score is based upon: CHF History: 0 HTN History: 1 Diabetes History: 0 Stroke History: 0 Vascular Disease History: 0 Age Score: 1 Gender Score: 1       ASSESSMENT AND PLAN: Persistent Atrial Fibrillation (ICD10:  I48.19) The patient's CHA2DS2-VASc score is 3, indicating a 3.2% annual risk of stroke.   S/p successful DCCV on 05/20/24.  She is currently in NSR. Continue Toprol  150 mg daily. We discussed rhythm control briefly if ERAF is noted. We discussed rhythm monitoring at home but patient does not have a smartphone. Discussed BP cuff and pulse oximeter to help in some form with monitoring HR at home. She will contact her insurance to help determine if they can assist with cost of these devices.  Secondary Hypercoagulable State (ICD10:  D68.69) The patient is at significant risk for stroke/thromboembolism based upon her CHA2DS2-VASc Score of 3.  Continue Warfarin (Coumadin ).     Follow up 6 months Afib clinic.    [name], PA-C  Afib Clinic [facility] [address] [place], [place] [address] [phone]

## 2024-05-30 NOTE — Patient Instructions (Addendum)
 Description   Continue taking warfarin 1 tablet daily except 1/2 tablet on Mondays and Fridays.   Recheck in 2 weeks.  Stay consistent with greens each week Coumadin  Clinic [phone]

## 2024-06-12 ENCOUNTER — Other Ambulatory Visit (HOSPITAL_COMMUNITY): Payer: Self-pay

## 2024-06-13 ENCOUNTER — Ambulatory Visit: Attending: Cardiovascular Disease | Admitting: *Deleted

## 2024-06-13 DIAGNOSIS — Z7901 Long term (current) use of anticoagulants: Secondary | ICD-10-CM

## 2024-06-13 DIAGNOSIS — I4891 Unspecified atrial fibrillation: Secondary | ICD-10-CM | POA: Diagnosis not present

## 2024-06-13 LAB — POCT INR: INR: 1.7 — AB (ref 2.0–3.0)

## 2024-06-13 NOTE — Progress Notes (Signed)
Please see anticoagulation encounter.

## 2024-06-13 NOTE — Patient Instructions (Signed)
 Description   Today take 1 tablet of warfarin then continue taking warfarin 1 tablet daily except 1/2 tablet on Mondays and Fridays.   Recheck in 2 weeks.  Stay consistent with greens each week Coumadin  Clinic [phone]

## 2024-06-24 ENCOUNTER — Other Ambulatory Visit (HOSPITAL_COMMUNITY): Payer: Self-pay

## 2024-06-25 ENCOUNTER — Other Ambulatory Visit (HOSPITAL_COMMUNITY): Payer: Self-pay

## 2024-06-25 DIAGNOSIS — E785 Hyperlipidemia, unspecified: Secondary | ICD-10-CM | POA: Diagnosis not present

## 2024-06-25 DIAGNOSIS — Z0000 Encounter for general adult medical examination without abnormal findings: Secondary | ICD-10-CM | POA: Diagnosis not present

## 2024-06-25 DIAGNOSIS — I480 Paroxysmal atrial fibrillation: Secondary | ICD-10-CM | POA: Diagnosis not present

## 2024-06-25 DIAGNOSIS — I10 Essential (primary) hypertension: Secondary | ICD-10-CM | POA: Diagnosis not present

## 2024-06-25 DIAGNOSIS — R7303 Prediabetes: Secondary | ICD-10-CM | POA: Diagnosis not present

## 2024-06-25 DIAGNOSIS — G4733 Obstructive sleep apnea (adult) (pediatric): Secondary | ICD-10-CM | POA: Diagnosis not present

## 2024-06-25 MED ORDER — ROSUVASTATIN CALCIUM 10 MG PO TABS
10.0000 mg | ORAL_TABLET | Freq: Every evening | ORAL | 0 refills | Status: DC
  Filled 2024-09-22: qty 90, 90d supply, fill #0

## 2024-06-25 MED ORDER — METOPROLOL SUCCINATE ER 100 MG PO TB24
100.0000 mg | ORAL_TABLET | Freq: Every day | ORAL | 0 refills | Status: DC
  Filled 2024-06-25: qty 90, 90d supply, fill #0

## 2024-06-25 MED ORDER — POTASSIUM CHLORIDE ER 20 MEQ PO TBCR
20.0000 meq | EXTENDED_RELEASE_TABLET | Freq: Every day | ORAL | 0 refills | Status: DC
  Filled 2024-07-16: qty 90, 90d supply, fill #0

## 2024-06-30 ENCOUNTER — Encounter: Payer: Self-pay | Admitting: *Deleted

## 2024-06-30 NOTE — Progress Notes (Unsigned)
 OFFICE NOTE:    Date:  07/01/2024  ID:  Jacqueline Petersen, DOB 05-Jul-1956, MRN [id number] PCP: Sun, Vyvyan, MD  [facility] Providers Cardiologist:  Jacqueline Passe, MD        Paroxysmal atrial fibrillation  ?tachy mediated CM >> TTE 05/01/24: EF 45-50, global HK, mild LVH, NL RVSF, NL PASP, RVSP 35, mod LAE, mild MR, RAP 8 PVCs  11% per Holter in 2016 Eval by EP (Jacqueline Petersen) >> EF normal, SAECG normal >> improved with reduced caffeine  Echocardiogram 01/10/16: EF 55-60, mod LAE SAECG 2/16: Normal  Holter 2/16: NSR, Freq PVCs (11%) Echo (12/10/14):  EF 55-60%, normal wall motion, PASP 32 mmHg Echocardiogram (12/04/2013): Moderate LVH, EF 50-55%, normal wall motion. LHC (03/2007): Normal coronary arteries, EF 50-55% Hypertension  Hyperlipidemia Sleep apnea       Discussed the use of AI scribe software for clinical note transcription with the patient, who gave verbal consent to proceed. History of Present Illness Jacqueline Petersen is a 68 y.o. female who returns for follow up of AFib. She was last seen in 04/2024. She had recurrent AFib w RVR. I adjusted her beta-blocker Rx and arrange DCCV once her INR had been therapeutic for 3 weeks. Of note, her TTE prior to the DCCV showed reduced EF 45-50. Plan is to repeat a TTE in NSR. If EF does not recover in NSR, she may need ischemic eval. DCCV was done 05/20/24. She had follow up in the AF Clinic 05/30/24 and was maintaining NSR at that time.   She is here alone. Her symptoms of heart racing have subsided, and she reports no swelling, chest discomfort, or syncope. Her breathing is better and described as 'wonderful'. She is frustrated with coumadin . INR today was 1.6. She would like to switch to a DOAC. I reviewed this with our PharmD. She has a $255 copay. After that, her monthly cost is $47.     ROS-See HPI          Risk Assessment/Calculations:  CHA2DS2-VASc Score = 3   This indicates a 3.2% annual risk of  stroke. The patient's score is based upon: CHF History: 0 HTN History: 1 Diabetes History: 0 Stroke History: 0 Vascular Disease History: 0 Age Score: 1 Gender Score: 1           Physical Exam:  VS:  BP 132/63   Pulse 64   Ht 5' 3 (1.6 m)   Wt 237 lb 12.8 oz (107.9 kg)   SpO2 98%   BMI 42.12 kg/m        Wt Readings from Last 3 Encounters:  07/01/24 237 lb 12.8 oz (107.9 kg)  05/30/24 242 lb 6.4 oz (110 kg)  05/16/24 245 lb 12.8 oz (111.5 kg)    Constitutional:      Appearance: Healthy appearance. Not in distress.  Neck:     Vascular: JVD normal.  Pulmonary:     Breath sounds: Normal breath sounds. No wheezing. No rales.  Cardiovascular:     Normal rate. Regular rhythm.     Murmurs: There is no murmur.  Edema:    Peripheral edema absent.  Abdominal:     Palpations: Abdomen is soft.       Assessment and Plan:    Assessment & Plan Persistent atrial fibrillation (HCC) Status post successful cardioversion.  She was maintaining sinus rhythm in the A-fib clinic last month.  She is maintaining sinus rhythm based upon exam today.  She would like  to switch warfarin to a DOAC.  As noted, we discussed potential cost and she is willing to accept this.  Creatinine clearance is 118. -Stop Coumadin  -Start Xarelto  20 mg daily -Continue Toprol -XL 150 mg daily -Follow-up 6 months Other cardiomyopathy (HCC) As noted, echocardiogram demonstrated EF 45-50.  Question if this is tachycardia mediated.  Plan is to repeat echocardiogram when she is back in sinus rhythm. -Continue hydralazine  25 mg 3 times a day, Toprol -XL 150 mg daily - Arrange limited echo to recheck EF Essential hypertension Blood pressure controlled. - Continue HCTZ 25 mg daily, hydralazine  25 mg 3 times a day, Toprol -XL 150 mg daily      Dispo:  Return in about 6 months (around 01/01/2025) for Jacqueline [employer], PA-C.  Signed, Jacqueline Ferrier, PA-C

## 2024-07-01 ENCOUNTER — Encounter: Payer: Self-pay | Admitting: Physician Assistant

## 2024-07-01 ENCOUNTER — Ambulatory Visit: Admitting: Pharmacist

## 2024-07-01 ENCOUNTER — Ambulatory Visit: Attending: Physician Assistant | Admitting: Physician Assistant

## 2024-07-01 ENCOUNTER — Other Ambulatory Visit (HOSPITAL_COMMUNITY): Payer: Self-pay

## 2024-07-01 VITALS — BP 132/63 | HR 64 | Ht 63.0 in | Wt 237.8 lb

## 2024-07-01 DIAGNOSIS — Z7901 Long term (current) use of anticoagulants: Secondary | ICD-10-CM

## 2024-07-01 DIAGNOSIS — I4819 Other persistent atrial fibrillation: Secondary | ICD-10-CM

## 2024-07-01 DIAGNOSIS — I428 Other cardiomyopathies: Secondary | ICD-10-CM | POA: Diagnosis not present

## 2024-07-01 DIAGNOSIS — I4891 Unspecified atrial fibrillation: Secondary | ICD-10-CM | POA: Diagnosis not present

## 2024-07-01 DIAGNOSIS — I10 Essential (primary) hypertension: Secondary | ICD-10-CM | POA: Diagnosis not present

## 2024-07-01 LAB — POCT INR: INR: 1.6 — AB (ref 2.0–3.0)

## 2024-07-01 MED ORDER — RIVAROXABAN 20 MG PO TABS
20.0000 mg | ORAL_TABLET | Freq: Every day | ORAL | 11 refills | Status: AC
  Filled 2024-07-01: qty 30, 30d supply, fill #0
  Filled 2024-07-28: qty 30, 30d supply, fill #1
  Filled 2024-08-26: qty 30, 30d supply, fill #2
  Filled 2024-09-22: qty 30, 30d supply, fill #3
  Filled 2024-10-27: qty 30, 30d supply, fill #4
  Filled 2024-11-24: qty 30, 30d supply, fill #5
  Filled 2024-12-23: qty 30, 30d supply, fill #6
  Filled 2025-01-15: qty 30, 30d supply, fill #7

## 2024-07-01 NOTE — Assessment & Plan Note (Signed)
 Blood pressure controlled. - Continue HCTZ 25 mg daily, hydralazine  25 mg 3 times a day, Toprol -XL 150 mg daily

## 2024-07-01 NOTE — Progress Notes (Signed)
Please see anticoagulation encounter.

## 2024-07-01 NOTE — Patient Instructions (Signed)
 Medication Instructions:  Your physician has recommended you make the following change in your medication:   STOP Warfarin (Coumadin ).    START Xarelto  20 mg YOU WILL TAKE YOUR 1ST DOSE TONIGHT  *If you need a refill on your cardiac medications before your next appointment, please call your pharmacy*  Lab Work: None ordered  If you have labs (blood work) drawn today and your tests are completely normal, you will receive your results only by: MyChart Message (if you have MyChart) OR A paper copy in the mail If you have any lab test that is abnormal or we need to change your treatment, we will call you to review the results.  Testing/Procedures: Your physician has requested that you have an Limited Echocardiogram in [place]. Echocardiography is a painless test that uses sound waves to create images of your heart. It provides your doctor with information about the size and shape of your heart and how well your heart's chambers and valves are working. This procedure takes approximately one hour. There are no restrictions for this procedure. Please do NOT wear cologne, perfume, aftershave, or lotions (deodorant is allowed). Please arrive 15 minutes prior to your appointment time.  Please note: We ask at that you not bring children with you during ultrasound (echo/ vascular) testing. Due to room size and safety concerns, children are not allowed in the ultrasound rooms during exams. Our front office staff cannot provide observation of children in our lobby area while testing is being conducted. An adult accompanying a patient to their appointment will only be allowed in the ultrasound room at the discretion of the ultrasound technician under special circumstances. We apologize for any inconvenience.   Follow-Up: At [facility], you and your health needs are our priority.  As part of our continuing mission to provide you with exceptional heart care, our providers are all part of one  team.  This team includes your primary Cardiologist (physician) and Advanced Practice Providers or APPs (Physician Assistants and Nurse Practitioners) who all work together to provide you with the care you need, when you need it.  Your next appointment:   6 month(s)  Provider:   Glendia Ferrier, PA-C          We recommend signing up for the patient portal called MyChart.  Sign up information is provided on this After Visit Summary.  MyChart is used to connect with patients for Virtual Visits (Telemedicine).  Patients are able to view lab/test results, encounter notes, upcoming appointments, etc.  Non-urgent messages can be sent to your provider as well.   To learn more about what you can do with MyChart, go to [email or web address].   Other Instructions

## 2024-07-01 NOTE — Patient Instructions (Signed)
 Description   Take 2 tablets today and then change to 1/2 tablet on Friday only. Take 1 tablet all other days of the week other than Fridays.   Recheck in 2 weeks.  Stay consistent with greens each week Coumadin  Clinic [phone]

## 2024-07-01 NOTE — Assessment & Plan Note (Signed)
 Status post successful cardioversion.  She was maintaining sinus rhythm in the A-fib clinic last month.  She is maintaining sinus rhythm based upon exam today.  She would like to switch warfarin to a DOAC.  As noted, we discussed potential cost and she is willing to accept this.  Creatinine clearance is 118. -Stop Coumadin  -Start Xarelto  20 mg daily -Continue Toprol -XL 150 mg daily -Follow-up 6 months

## 2024-07-01 NOTE — Assessment & Plan Note (Signed)
 As noted, echocardiogram demonstrated EF 45-50.  Question if this is tachycardia mediated.  Plan is to repeat echocardiogram when she is back in sinus rhythm. -Continue hydralazine  25 mg 3 times a day, Toprol -XL 150 mg daily - Arrange limited echo to recheck EF

## 2024-07-02 ENCOUNTER — Telehealth: Payer: Self-pay

## 2024-07-02 NOTE — Telephone Encounter (Signed)
 PAP application mailed out to patient

## 2024-07-02 NOTE — Telephone Encounter (Signed)
-----   Message from Hildegard Quivers sent at 07/01/2024 11:32 AM EDT ----- Regarding: Patient Assistance for Xarelto  Contact: [phone] Good morning!  Jacqueline Petersen expressed that she would have trouble affording her Xarelto  even after her deductible was met.  Her copay with the deductible was going to be $260.46, and then $47 after that.  I was able to use a free month trial card for her today and she picked up her Xarelto  at no charge.  May I see if you all could help her with potentially receiving some patient assistance please?  Thank you!

## 2024-07-02 NOTE — Telephone Encounter (Signed)
 Staff onsite 07/02/24 will mail out PAP application and instructions to patient.

## 2024-07-11 ENCOUNTER — Telehealth: Payer: Self-pay | Admitting: Pharmacy Technician

## 2024-07-11 ENCOUNTER — Telehealth: Payer: Self-pay | Admitting: Licensed Clinical Social Worker

## 2024-07-11 NOTE — Telephone Encounter (Addendum)
    Patient came by and I filled out the extra help online for medicare.   Scanned our portion in media

## 2024-07-11 NOTE — Telephone Encounter (Signed)
 H&V Care Navigation CSW Progress Note  Clinical Social Worker contacted patient by phone to f/u on referral from pharmacy assistance team regarding utilities. Per pharmacy team pt air conditioning does not work. Note from previous assessment completed in March that pt heating was also not working and she was referred to [employer] for Housing for advocacy with landlord. No answer today when I called, unable to leave voicemail as the mailbox is currently full.  Patient is participating in a Managed Medicaid Plan:  No, [facility] Medicare  SDOH Screenings   Food Insecurity: Food Insecurity Present (02/29/2024)  Housing: High Risk (02/29/2024)  Transportation Needs: No Transportation Needs (02/29/2024)  Utilities: Not At Risk (02/29/2024)  Financial Resource Strain: Medium Risk (02/29/2024)  Social Connections: Unknown (10/31/2022)   Received from [facility]  Tobacco Use: Low Risk  (07/01/2024)  Health Literacy: Adequate Health Literacy (02/29/2024)    Marit Lark, MSW, LCSW Clinical Social Worker II [facility] Heart/Vascular Care Navigation  [phone]- work cell phone (preferred)

## 2024-07-14 ENCOUNTER — Telehealth: Payer: Self-pay | Admitting: Licensed Clinical Social Worker

## 2024-07-14 NOTE — Telephone Encounter (Signed)
 H&V Care Navigation CSW Progress Note  Clinical Social Worker contacted patient by phone to f/u.  Patient is participating in a Managed Medicaid Plan:  No, [facility] Medicare  SDOH Screenings   Food Insecurity: Food Insecurity Present (07/14/2024)  Housing: High Risk (07/14/2024)  Transportation Needs: No Transportation Needs (02/29/2024)  Utilities: Not At Risk (07/14/2024)  Financial Resource Strain: Medium Risk (07/14/2024)  Social Connections: Unknown (10/31/2022)   Received from [facility]  Tobacco Use: Low Risk  (07/01/2024)  Health Literacy: Adequate Health Literacy (07/14/2024)    Marit Lark, MSW, LCSW Clinical Social Worker II [facility] Health Heart/Vascular Care Navigation  [phone]- work cell phone (preferred)

## 2024-07-15 ENCOUNTER — Encounter

## 2024-07-16 ENCOUNTER — Other Ambulatory Visit (HOSPITAL_COMMUNITY): Payer: Self-pay

## 2024-07-16 ENCOUNTER — Other Ambulatory Visit: Payer: Self-pay

## 2024-07-16 NOTE — Telephone Encounter (Signed)
 Faxed signed provider and patient portion to [phone]

## 2024-07-17 NOTE — Telephone Encounter (Signed)
 PAP: Application for Xarelto  has been submitted to [employer] via fax

## 2024-07-17 NOTE — Telephone Encounter (Signed)
 Company requesting additional information.

## 2024-07-21 ENCOUNTER — Telehealth: Payer: Self-pay | Admitting: Licensed Clinical Social Worker

## 2024-07-21 NOTE — Telephone Encounter (Signed)
 Letter sent to pt requesting POI.

## 2024-07-21 NOTE — Telephone Encounter (Signed)
 H&V Care Navigation CSW Progress Note  Clinical Social Worker contacted patient by phone to f/u on referral to [facility]. Pt answered at [phone]. Pt unsure if she spoke with anyone. I provided her Stefanie's number with [facility] for Housing to see if any programs available for tenant advocacy for heat/cooling. Pt agreeable to reaching out to her. Additional resources have been sent to pt, encouraged her to call me if none of these are able to assist her with what she is looking for at this time.  Patient is participating in a Managed Medicaid Plan:  No, [facility] Medicare  SDOH Screenings   Food Insecurity: Food Insecurity Present (07/14/2024)  Housing: High Risk (07/14/2024)  Transportation Needs: No Transportation Needs (02/29/2024)  Utilities: Not At Risk (07/14/2024)  Financial Resource Strain: Medium Risk (07/14/2024)  Social Connections: Unknown (10/31/2022)   Received from [facility]  Tobacco Use: Low Risk  (07/01/2024)  Health Literacy: Adequate Health Literacy (07/14/2024)     Marit Lark, MSW, LCSW Clinical Social Worker II [facility] Health Heart/Vascular Care Navigation  [phone]- work cell phone (preferred)

## 2024-07-23 ENCOUNTER — Telehealth: Payer: Self-pay | Admitting: Physician Assistant

## 2024-07-23 ENCOUNTER — Other Ambulatory Visit (HOSPITAL_COMMUNITY): Payer: Self-pay

## 2024-07-23 DIAGNOSIS — Z79899 Other long term (current) drug therapy: Secondary | ICD-10-CM

## 2024-07-23 MED ORDER — HYDRALAZINE HCL 50 MG PO TABS
50.0000 mg | ORAL_TABLET | Freq: Three times a day (TID) | ORAL | 3 refills | Status: AC
  Filled 2024-07-23: qty 270, 90d supply, fill #0
  Filled 2024-10-27: qty 270, 90d supply, fill #1
  Filled 2025-01-14: qty 270, 90d supply, fill #2

## 2024-07-23 NOTE — Telephone Encounter (Signed)
 Called patient about message. Patient complaining of bilateral ankle swelling, left worse than right and elevated BP, 160's/90's, that has been going on for over a month. Patient stated she is keeping a low salt diet. Patient is taking her medications Metoprolol  150 mg daily, hydrochlorothiazide  25 mg daily, and hydralazine  25 mg TID. Patient stated the hydralazine  is just not working. Will send message to Glendia Ferrier PA for advisement.

## 2024-07-23 NOTE — Telephone Encounter (Signed)
 Pt c/o BP issue: STAT if pt c/o blurred vision, one-sided weakness or slurred speech.  STAT if BP is GREATER than 180/120 TODAY.  STAT if BP is LESS than 90/60 and SYMPTOMATIC TODAY  1. What is your BP concern? Pt called in stating her bp has still been elevated, she does not think bp medication change (hydralazine ) is working for her.   2. Have you taken any BP medication today?no   3. What are your last 5 BP readings? She did not have any exact readings written down  160's/90s   4. Are you having any other symptoms (ex. Dizziness, headache, blurred vision, passed out)? Dizzy, headache

## 2024-07-23 NOTE — Telephone Encounter (Signed)
 Have her come in for BMET.  Increase Hydralazine  to 50 mg three times a day  Monitor BP for 1 week and send readings for review.  Glendia Ferrier, PA-C    07/23/2024 4:14 PM

## 2024-07-23 NOTE — Telephone Encounter (Signed)
 Call placed to pt.  She has been made aware to come in for BMET in the morning, increase the Hydralazine  to 50 mg tid, rx sent to downstairs pharmacy, per pt.  She will monitor her bp for 1 week and call / send in the log.

## 2024-07-24 DIAGNOSIS — Z79899 Other long term (current) drug therapy: Secondary | ICD-10-CM | POA: Diagnosis not present

## 2024-07-25 ENCOUNTER — Ambulatory Visit: Payer: Self-pay | Admitting: Physician Assistant

## 2024-07-25 DIAGNOSIS — Z79899 Other long term (current) drug therapy: Secondary | ICD-10-CM

## 2024-07-25 LAB — BASIC METABOLIC PANEL WITH GFR
BUN/Creatinine Ratio: 15 (ref 12–28)
BUN: 11 mg/dL (ref 8–27)
CO2: 25 mmol/L (ref 20–29)
Calcium: 9.4 mg/dL (ref 8.7–10.3)
Chloride: 101 mmol/L (ref 96–106)
Creatinine, Ser: 0.74 mg/dL (ref 0.57–1.00)
Glucose: 116 mg/dL — ABNORMAL HIGH (ref 70–99)
Potassium: 3.8 mmol/L (ref 3.5–5.2)
Sodium: 140 mmol/L (ref 134–144)
eGFR: 89 mL/min/1.73 (ref >59)

## 2024-07-28 ENCOUNTER — Other Ambulatory Visit: Payer: Self-pay | Admitting: *Deleted

## 2024-07-28 ENCOUNTER — Other Ambulatory Visit (HOSPITAL_COMMUNITY): Payer: Self-pay

## 2024-07-28 DIAGNOSIS — Z79899 Other long term (current) drug therapy: Secondary | ICD-10-CM

## 2024-07-28 MED ORDER — POTASSIUM CHLORIDE CRYS ER 20 MEQ PO TBCR
20.0000 meq | EXTENDED_RELEASE_TABLET | Freq: Two times a day (BID) | ORAL | 1 refills | Status: AC
  Filled 2024-07-28: qty 180, 90d supply, fill #0
  Filled 2024-10-27: qty 180, 90d supply, fill #1

## 2024-07-28 NOTE — Progress Notes (Unsigned)
 HPI female never smoker followed for OSA, complicated by atrial fib/Coumadin , GERD, HBP NPSG 02/11/17      AHI 30.9/ hr, desaturation to 85%, CPAP titrated to 10 cwp,    Body weight 258 lbs  ===========================================================================================   11/13/22- Coming to re-establish- 68 year old female never smoker followed for OSA, complicated by Atrial Fib/eliquis , GERD, HTN, Morbid Obesity,  CPAP auto 5-20/ [facility] Download compliance- 0%, AHI 3.5/ hr Body weight today-244 lbs Covid vax- Flu vax- Complains CPAP makes her nose burn. We discussed humidification and nasal saline. Due for replacement machine- should give us  a chancre to regroup.  07/29/24- 68 year old female never smoker followed for OSA, complicated by Atrial Fib/eliquis , GERD, HTN, Morbid Obesity,  CPAP auto 5-20/ [facility]   replaced 11/13/22 Download compliance-7%, AHI 1/hr Body weight today-241 lbs Discussed the use of AI scribe software for clinical note transcription with the patient, who gave verbal consent to proceed.  History of Present Illness   Jacqueline Petersen is a 68 year old female who presents for follow-up regarding CPAP usage.  She has been using current CPAP machine for two years with settings between five and twenty centimeters of water pressure, typically operating around nine to ten centimeters. She experiences one breakthrough apnea per hour but uses the CPAP for only two to three hours per night, suspecting she removes the mask in her sleep. She goes to bed late, sometimes between 1 to 3 AM, and occasionally falls asleep sitting up. She does not use any sleep aids. Comfort and compliance goals emphasized.  She recently switched back to [employer] from [employer] due to equipment cost and coverage issues. She prefers a nasal mask over nasal pillows and is currently with [facility] for her equipment needs. She doesn't answer calls from numbers she doesn't  recognize, so we will give her [employer]'s number to call.   She had had cardioversion in May, which she understand s went well.     Assessment and Plan:    Obstructive sleep apnea Suboptimal CPAP usage with settings primarily at 9-10 cm H2O, resulting in one apnea per hour. Usage averages 2-3 hours per night, below recommended duration. Improved adherence needed for cardiac and overall health. - Encouraged CPAP use for entire sleep duration. - Discussed importance of consistent CPAP use for cardiac and brain health. - Advised obtaining CPAP supplies through [employer]. - Instructed to contact [facility] for CPAP supplies and preferred nasal mask type.  Atrial fibrillation Successful cardioversion in May. Continued management of sleep apnea important for cardiac health. - Encouraged adherence to CPAP therapy to support cardiac health.     CXR 04/23/24 MPRESSION: Mild cardiomegaly with mild central vascular congestion.  ROS-see HPI   + = positive Constitutional:    weight loss, night sweats, fevers, chills, +fatigue, lassitude. HEENT:    headaches, difficulty swallowing, tooth/dental problems, sore throat,       sneezing, itching, ear ache, +nasal congestion, post nasal drip, snoring CV:    chest pain, orthopnea, PND, swelling in lower extremities, anasarca,                                                            dizziness, palpitations Resp:   shortness of breath with exertion or at rest.  productive cough,   non-productive cough, coughing up of blood.              change in color of mucus.  wheezing.   Skin:    rash or lesions. GI:  No-   heartburn, indigestion, abdominal pain, nausea, vomiting, diarrhea,                 change in bowel habits, loss of appetite GU: dysuria, change in color of urine, no urgency or frequency.   flank pain. MS:   joint pain, stiffness, decreased range of motion, back pain. Neuro-     nothing unusual Psych:  change in mood or  affect.  depression or anxiety.   memory loss.  OBJ- Physical Exam General- Alert, Oriented, Affect-appropriate, Distress- none acute, +morbidly obese  Skin- rash-none, lesions- none, excoriation- none Lymphadenopathy- none Head- atraumatic            Eyes- Gross vision intact, PERRLA, conjunctivae and secretions clear            Ears- Hearing, canals-normal            Nose-+ turbinate edema, no-Septal dev, mucus, polyps, erosion, perforation             Throat- Mallampati II-III , mucosa clear , drainage- none, tonsils- atrophic, has lower teeth, +upper full denture Neck- flexible , trachea midline, no stridor , thyroid nl, carotid no bruit+ Chest - symmetrical excursion , unlabored           Heart/CV- IRR + , no murmur , no gallop  , no rub, nl s1 s2                           - JVD- none , edema- none, stasis changes- none, varices- none           Lung- clear to P&A, wheeze- none, cough- none , dullness-none, rub- none           Chest wall-  Abd-  Br/ Gen/ Rectal- Not done, not indicated Extrem- cyanosis- none, clubbing, none, atrophy- none, strength- nl Neuro- grossly intact to observation

## 2024-07-29 ENCOUNTER — Telehealth: Payer: Self-pay | Admitting: Cardiology

## 2024-07-29 NOTE — Telephone Encounter (Signed)
 Patient came from downstairs pharmacy to give fixed income form to Coumadin  Pharmacy needing Xeralto. 07/29/2024 @04 :06pm Allean Mink took care of it.

## 2024-07-30 NOTE — Telephone Encounter (Signed)
 Kristin dropped off extra help medicare letter. Its scanned in media

## 2024-07-31 ENCOUNTER — Encounter: Payer: Self-pay | Admitting: Internal Medicine

## 2024-07-31 ENCOUNTER — Ambulatory Visit: Admitting: Internal Medicine

## 2024-07-31 VITALS — BP 144/82 | HR 69 | Temp 97.9°F | Ht 63.0 in | Wt 241.6 lb

## 2024-07-31 DIAGNOSIS — G4733 Obstructive sleep apnea (adult) (pediatric): Secondary | ICD-10-CM

## 2024-07-31 DIAGNOSIS — I4891 Unspecified atrial fibrillation: Secondary | ICD-10-CM

## 2024-07-31 NOTE — Patient Instructions (Signed)
 Order- DME- [facility] please update supplies, refit mask of choice, continue [employer]    Jacqueline Petersen- try to use your CPAP all night, every night and any time you sleep.

## 2024-08-01 DIAGNOSIS — Z79899 Other long term (current) drug therapy: Secondary | ICD-10-CM | POA: Diagnosis not present

## 2024-08-01 NOTE — Telephone Encounter (Signed)
 2nd attempt. Letter sent to pt requesting POI.

## 2024-08-02 LAB — BASIC METABOLIC PANEL WITH GFR
BUN/Creatinine Ratio: 20 (ref 12–28)
BUN: 14 mg/dL (ref 8–27)
CO2: 23 mmol/L (ref 20–29)
Calcium: 9.2 mg/dL (ref 8.7–10.3)
Chloride: 101 mmol/L (ref 96–106)
Creatinine, Ser: 0.71 mg/dL (ref 0.57–1.00)
Glucose: 110 mg/dL — ABNORMAL HIGH (ref 70–99)
Potassium: 3.9 mmol/L (ref 3.5–5.2)
Sodium: 142 mmol/L (ref 134–144)
eGFR: 94 mL/min/1.73 (ref >59)

## 2024-08-03 ENCOUNTER — Ambulatory Visit: Payer: Self-pay | Admitting: Physician Assistant

## 2024-08-04 NOTE — Progress Notes (Signed)
 Pt has been made aware of normal result and verbalized understanding.  jw

## 2024-08-12 ENCOUNTER — Other Ambulatory Visit (HOSPITAL_COMMUNITY)

## 2024-08-12 ENCOUNTER — Ambulatory Visit (HOSPITAL_COMMUNITY)
Admission: RE | Admit: 2024-08-12 | Discharge: 2024-08-12 | Disposition: A | Discharge status: Home / Self Care | Source: Ambulatory Visit | Attending: Physician Assistant | Admitting: Physician Assistant

## 2024-08-12 DIAGNOSIS — I4819 Other persistent atrial fibrillation: Secondary | ICD-10-CM | POA: Diagnosis not present

## 2024-08-12 DIAGNOSIS — I428 Other cardiomyopathies: Secondary | ICD-10-CM | POA: Diagnosis not present

## 2024-08-12 LAB — ECHOCARDIOGRAM LIMITED
Area-P 1/2: 3.58 cm2
S' Lateral: 3.7 cm

## 2024-08-13 ENCOUNTER — Telehealth: Payer: Self-pay | Admitting: Cardiology

## 2024-08-13 ENCOUNTER — Ambulatory Visit: Payer: Self-pay | Admitting: Physician Assistant

## 2024-08-13 NOTE — Telephone Encounter (Signed)
 Pt c/o BP issue: STAT if pt c/o blurred vision, one-sided weakness or slurred speech.  STAT if BP is GREATER than 180/120 TODAY.  STAT if BP is LESS than 90/60 and SYMPTOMATIC TODAY  1. What is your BP concern? Elevated  2. Have you taken any BP medication today? Yes  3. What are your last 5 BP readings?190/99  4. Are you having any other symptoms (ex. Dizziness, headache, blurred vision, passed out)? Seeing black spots, weak, very tired  Call transferred

## 2024-08-13 NOTE — Telephone Encounter (Signed)
 Received a call from patient she stated her B/P has been elevated this week.Today 190/90,190/99.See did not check pulse.Medications reviewed and she takes as listed in chart.Stated she feels weak no energy.Stated she is seeing multiple black spots when B/P elevated.She has been seeing 1 black spot continuously.Advised she needs to see a ophthalmologist.I will send message to Glendia Ferrier PA for advice.

## 2024-08-13 NOTE — Telephone Encounter (Signed)
 Spoke to patient Glendia Ferrier PA advised to go to ED to be evaluated.

## 2024-08-13 NOTE — Telephone Encounter (Signed)
 With significantly elevated BP and visual changes as described, she should go to the ED. Glendia Ferrier, PA-C    08/13/2024 1:14 PM

## 2024-08-15 NOTE — Telephone Encounter (Signed)
 Unable to obtain requested information from pt, closing encounter.

## 2024-08-19 NOTE — Progress Notes (Signed)
 Pt has been made aware of normal result and verbalized understanding.  jw

## 2024-08-26 ENCOUNTER — Other Ambulatory Visit (HOSPITAL_COMMUNITY): Payer: Self-pay

## 2024-09-02 ENCOUNTER — Encounter: Payer: Self-pay | Admitting: Internal Medicine

## 2024-09-02 ENCOUNTER — Other Ambulatory Visit (HOSPITAL_COMMUNITY)

## 2024-09-22 ENCOUNTER — Other Ambulatory Visit: Payer: Self-pay

## 2024-09-22 ENCOUNTER — Other Ambulatory Visit (HOSPITAL_COMMUNITY): Payer: Self-pay

## 2024-10-13 ENCOUNTER — Other Ambulatory Visit (HOSPITAL_COMMUNITY): Payer: Self-pay

## 2024-10-13 ENCOUNTER — Other Ambulatory Visit (HOSPITAL_BASED_OUTPATIENT_CLINIC_OR_DEPARTMENT_OTHER): Payer: Self-pay

## 2024-10-15 ENCOUNTER — Other Ambulatory Visit (HOSPITAL_COMMUNITY): Payer: Self-pay

## 2024-10-16 ENCOUNTER — Other Ambulatory Visit (HOSPITAL_COMMUNITY): Payer: Self-pay

## 2024-10-27 ENCOUNTER — Other Ambulatory Visit (HOSPITAL_COMMUNITY): Payer: Self-pay

## 2024-11-18 ENCOUNTER — Other Ambulatory Visit (HOSPITAL_COMMUNITY): Payer: Self-pay

## 2024-11-18 ENCOUNTER — Encounter: Payer: Self-pay | Admitting: Physician Assistant

## 2024-11-19 ENCOUNTER — Other Ambulatory Visit (HOSPITAL_COMMUNITY): Payer: Self-pay

## 2024-11-24 ENCOUNTER — Other Ambulatory Visit (HOSPITAL_COMMUNITY): Payer: Self-pay

## 2024-12-16 ENCOUNTER — Other Ambulatory Visit: Payer: Self-pay | Admitting: Physician Assistant

## 2024-12-16 ENCOUNTER — Other Ambulatory Visit (HOSPITAL_COMMUNITY): Payer: Self-pay

## 2024-12-16 MED ORDER — HYDROCHLOROTHIAZIDE 25 MG PO TABS
25.0000 mg | ORAL_TABLET | Freq: Every day | ORAL | 3 refills | Status: AC
  Filled 2024-12-16: qty 90, 90d supply, fill #0

## 2024-12-17 ENCOUNTER — Other Ambulatory Visit (HOSPITAL_COMMUNITY): Payer: Self-pay

## 2024-12-17 MED ORDER — ROSUVASTATIN CALCIUM 10 MG PO TABS
10.0000 mg | ORAL_TABLET | Freq: Every evening | ORAL | 0 refills | Status: DC
  Filled 2024-12-17: qty 90, 90d supply, fill #0

## 2024-12-23 ENCOUNTER — Other Ambulatory Visit (HOSPITAL_COMMUNITY): Payer: Self-pay

## 2024-12-23 ENCOUNTER — Other Ambulatory Visit: Payer: Self-pay

## 2024-12-29 ENCOUNTER — Other Ambulatory Visit (HOSPITAL_COMMUNITY): Payer: Self-pay

## 2024-12-29 MED ORDER — ROSUVASTATIN CALCIUM 10 MG PO TABS: 10.0000 mg | ORAL_TABLET | Freq: Every evening | ORAL | 3 refills | Status: AC

## 2024-12-29 MED ORDER — METOPROLOL SUCCINATE ER 100 MG PO TB24: 100.0000 mg | ORAL_TABLET | Freq: Every day | ORAL | 3 refills | Status: AC

## 2025-01-06 ENCOUNTER — Ambulatory Visit (HOSPITAL_COMMUNITY): Admitting: Internal Medicine

## 2025-01-14 ENCOUNTER — Other Ambulatory Visit (HOSPITAL_COMMUNITY): Payer: Self-pay

## 2025-01-16 ENCOUNTER — Other Ambulatory Visit (HOSPITAL_COMMUNITY): Payer: Self-pay

## 2025-01-20 ENCOUNTER — Ambulatory Visit (HOSPITAL_COMMUNITY): Admitting: Internal Medicine

## 2025-01-20 NOTE — Progress Notes (Incomplete)
 "   Primary Care Physician: Sun, Vyvyan, MD Primary Cardiologist: Madonna Large, DO Electrophysiologist: None     Referring Physician: Glendia Ferrier, PA-C     Jacqueline Petersen is a 69 y.o. female with a history of cardiomyopathy, systolic LV dysfunction, PVCs, HTN, OSA on CPAP, and atrial fibrillation who presents for consultation in the [facility]. Seen by Cardiology on 5/7 and 5/23 in Afib on coumadin . S/p successful DCCV on 5/27. Patient was previously on Coumadin  and is now on Xarelto  for a CHADS2VASC score of 4.  Follow-up 01/20/2025.  Patient is currently in***.  She has had overall**A-fib burden since last office visit.  She is taking Toprol  100 mg daily.  No bleeding issues on Xarelto .  Today, she denies symptoms of palpitations, chest pain, shortness of breath, orthopnea, PND, lower extremity edema, dizziness, presyncope, syncope, snoring, daytime somnolence, bleeding, or neurologic sequela. The patient is tolerating medications without difficulties and is otherwise without complaint today.    Atrial Fibrillation Risk Factors:  she does have symptoms or diagnosis of sleep apnea. she is compliant with CPAP therapy.  she has a BMI of There is no height or weight on file to calculate BMI.. There were no vitals filed for this visit.   Current Outpatient Medications  Medication Sig Dispense Refill   acetaminophen  (TYLENOL ) 650 MG CR tablet Take 650 mg by mouth 2 (two) times daily.     Cholecalciferol (VITAMIN D) 2000 UNITS CAPS Take 2,000 Units by mouth every evening.     CINNAMON PO Take 2 capsules by mouth every morning.     ferrous sulfate  325 (65 FE) MG EC tablet Take 1 tablet by mouth once a day 90 tablet 3   hydrALAZINE  (APRESOLINE ) 50 MG tablet Take 1 tablet (50 mg total) by mouth 3 (three) times daily. 270 tablet 3   hydrochlorothiazide  (HYDRODIURIL ) 25 MG tablet Take 1 tablet (25 mg total) by mouth daily. 90 tablet 3   metoprolol   succinate (TOPROL -XL) 100 MG 24 hr tablet Take 1 & 1/2 tablets (150 mg total) by mouth daily. Take with or immediately following a meal. 135 tablet 3   metoprolol  succinate (TOPROL -XL) 100 MG 24 hr tablet Take 1 tablet (100 mg total) by mouth daily. 90 tablet 3   Multiple Vitamins-Minerals (WOMENS 50+ MULTI VITAMIN) TABS Take 1 tablet by mouth daily.     potassium chloride  SA (KLOR-CON  M) 20 MEQ tablet Take 1 tablet (20 mEq total) by mouth 2 (two) times daily. 180 tablet 1   rivaroxaban  (XARELTO ) 20 MG TABS tablet Take 1 tablet (20 mg total) by mouth daily with supper. 30 tablet 11   rosuvastatin  (CRESTOR ) 10 MG tablet Take 1 tablet (10 mg total) by mouth every evening. 90 tablet 3   thiamine (VITAMIN B-1) 100 MG tablet Take 100 mg by mouth daily.     No current facility-administered medications for this visit.    Atrial Fibrillation Management history:  Previous antiarrhythmic drugs: none Previous cardioversions: 05/20/24 Previous ablations: none Anticoagulation history: coumadin , Xarelto    ROS- All systems are reviewed and negative except as per the HPI above.  Physical Exam: There were no vitals taken for this visit.  GEN- The patient is well appearing, alert and oriented x 3 today.   Neck - no JVD or carotid bruit noted Lungs- Clear to ausculation bilaterally, normal work of breathing Heart- ***Regular rate and rhythm, no murmurs, rubs or gallops, PMI not laterally displaced Extremities- no clubbing, cyanosis, or edema  Skin - no rash or ecchymosis noted   EKG today demonstrates  ***  Echo 05/01/24 demonstrated  1. Left ventricular ejection fraction, by estimation, is 45 to 50%. The  left ventricle has mildly decreased function. The left ventricle  demonstrates global hypokinesis. The left ventricular internal cavity size  was mildly dilated. There is mild left  ventricular hypertrophy. Left ventricular diastolic parameters are  indeterminate.   2. Right ventricular systolic  function is normal. The right ventricular  size is normal. There is normal pulmonary artery systolic pressure. The  estimated right ventricular systolic pressure is 35.0 mmHg.   3. Left atrial size was moderately dilated.   4. The mitral valve is normal in structure. Mild mitral valve  regurgitation.   5. The aortic valve is tricuspid. Aortic valve regurgitation is not  visualized. No aortic stenosis is present.   6. The inferior vena cava is normal in size with <50% respiratory  variability, suggesting right atrial pressure of 8 mmHg.    ASSESSMENT & PLAN CHA2DS2-VASc Score = 3  The patient's score is based upon: CHF History: 0 HTN History: 1 Diabetes History: 0 Stroke History: 0 Vascular Disease History: 0 Age Score: 1 Gender Score: 1       ASSESSMENT AND PLAN: Persistent Atrial Fibrillation (ICD10:  I48.19) The patient's CHA2DS2-VASc score is 3, indicating a 3.2% annual risk of stroke.   S/p successful DCCV on 05/20/24.  Patient is currently in***.  Continue Toprol  100 mg daily.  Secondary Hypercoagulable State (ICD10:  D68.69) The patient is at significant risk for stroke/thromboembolism based upon her CHA2DS2-VASc Score of 3.  Continue Xarelto  20 mg daily.    Follow up***Afib clinic.    Terra Pac, PA-C  Afib Clinic [facility] [address] [place], [place] [address] [phone]  "

## 2025-02-02 ENCOUNTER — Ambulatory Visit (HOSPITAL_COMMUNITY): Admitting: Internal Medicine

## 2025-03-03 ENCOUNTER — Ambulatory Visit: Admitting: Physician Assistant
